# Patient Record
Sex: Female | Born: 1955 | Race: White | Hispanic: No | Smoking: Current every day smoker
Health system: Southern US, Community
[De-identification: ages and names within clinical notes are randomized; demographics above are authoritative.]

## PROBLEM LIST (undated history)

## (undated) DIAGNOSIS — C9200 Acute myeloblastic leukemia, not having achieved remission: Secondary | ICD-10-CM

## (undated) DIAGNOSIS — E871 Hypo-osmolality and hyponatremia: Secondary | ICD-10-CM

## (undated) DIAGNOSIS — C9590 Leukemia, unspecified not having achieved remission: Secondary | ICD-10-CM

## (undated) DIAGNOSIS — I700 Atherosclerosis of aorta: Secondary | ICD-10-CM

## (undated) DIAGNOSIS — D649 Anemia, unspecified: Secondary | ICD-10-CM

## (undated) DIAGNOSIS — K219 Gastro-esophageal reflux disease without esophagitis: Secondary | ICD-10-CM

## (undated) DIAGNOSIS — D696 Thrombocytopenia, unspecified: Principal | ICD-10-CM

## (undated) HISTORY — PX: HIP ARTHROSCOPY: SUR88

## (undated) HISTORY — PX: OTHER SURGICAL HISTORY: SHX169

## (undated) HISTORY — PX: CARDIAC SURGERY: SHX584

## (undated) HISTORY — PX: CHOLECYSTECTOMY: SHX55

---

## 1998-12-31 ENCOUNTER — Encounter: Payer: Self-pay | Admitting: *Deleted

## 1999-01-01 ENCOUNTER — Inpatient Hospital Stay (HOSPITAL_COMMUNITY): Admission: EM | Admit: 1999-01-01 | Discharge: 1999-01-01 | Payer: Self-pay | Admitting: *Deleted

## 1999-01-01 ENCOUNTER — Encounter: Payer: Self-pay | Admitting: Cardiology

## 1999-02-22 ENCOUNTER — Ambulatory Visit (HOSPITAL_COMMUNITY): Admission: RE | Admit: 1999-02-22 | Discharge: 1999-02-22 | Payer: Self-pay | Admitting: Cardiology

## 2000-05-10 ENCOUNTER — Emergency Department (HOSPITAL_COMMUNITY): Admission: EM | Admit: 2000-05-10 | Discharge: 2000-05-11 | Payer: Self-pay | Admitting: Emergency Medicine

## 2000-05-11 ENCOUNTER — Encounter: Payer: Self-pay | Admitting: Emergency Medicine

## 2001-10-09 ENCOUNTER — Observation Stay (HOSPITAL_COMMUNITY): Admission: EM | Admit: 2001-10-09 | Discharge: 2001-10-10 | Payer: Self-pay | Admitting: *Deleted

## 2001-10-09 ENCOUNTER — Encounter: Payer: Self-pay | Admitting: *Deleted

## 2003-10-18 ENCOUNTER — Emergency Department (HOSPITAL_COMMUNITY): Admission: EM | Admit: 2003-10-18 | Discharge: 2003-10-19 | Payer: Self-pay | Admitting: Emergency Medicine

## 2004-03-06 ENCOUNTER — Emergency Department (HOSPITAL_COMMUNITY): Admission: EM | Admit: 2004-03-06 | Discharge: 2004-03-07 | Payer: Self-pay | Admitting: Emergency Medicine

## 2004-10-30 ENCOUNTER — Inpatient Hospital Stay (HOSPITAL_COMMUNITY): Admission: EM | Admit: 2004-10-30 | Discharge: 2004-11-03 | Payer: Self-pay | Admitting: Emergency Medicine

## 2004-11-12 ENCOUNTER — Ambulatory Visit (HOSPITAL_COMMUNITY): Admission: RE | Admit: 2004-11-12 | Discharge: 2004-11-12 | Payer: Self-pay

## 2004-11-27 ENCOUNTER — Ambulatory Visit: Payer: Self-pay | Admitting: Internal Medicine

## 2004-12-07 ENCOUNTER — Encounter (INDEPENDENT_AMBULATORY_CARE_PROVIDER_SITE_OTHER): Payer: Self-pay | Admitting: *Deleted

## 2004-12-07 ENCOUNTER — Ambulatory Visit: Payer: Self-pay | Admitting: Gastroenterology

## 2005-01-14 ENCOUNTER — Ambulatory Visit: Payer: Self-pay | Admitting: Internal Medicine

## 2006-05-21 ENCOUNTER — Encounter (INDEPENDENT_AMBULATORY_CARE_PROVIDER_SITE_OTHER): Payer: Self-pay | Admitting: Specialist

## 2006-05-21 ENCOUNTER — Ambulatory Visit (HOSPITAL_COMMUNITY): Admission: RE | Admit: 2006-05-21 | Discharge: 2006-05-21 | Payer: Self-pay | Admitting: Specialist

## 2007-03-08 IMAGING — US US ABDOMEN COMPLETE
1 series · 13 of 25 positions shown · non-contrast
Comparison: CT of the chest and abdomen dated 10/30/04.

CLINICAL DATA: Nausea and diarrhea.
 ABDOMEN ULTRASOUND:
TECHNIQUE: Complete abdominal ultrasound examination was performed including evaluation of the liver, gallbladder, bile ducts, pancreas, kidneys, spleen, IVC, and abdominal aorta.

[Series 1: unknown · 0.33mm/px · 13 of 49 slices shown]
[im 1/49]
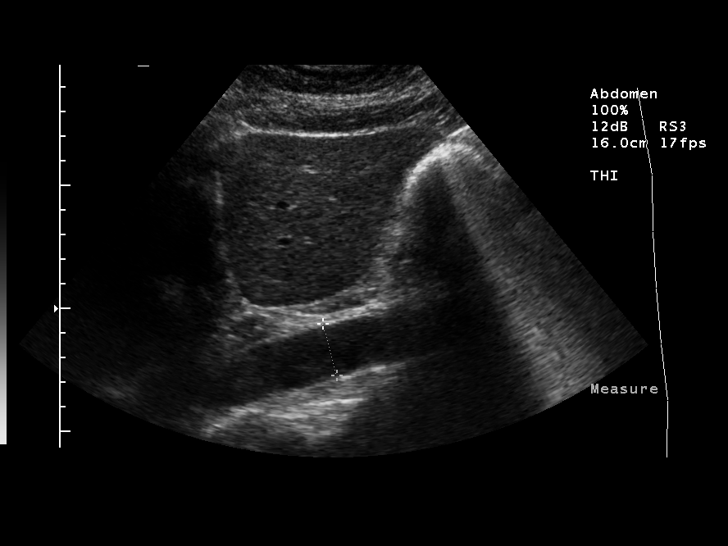
[im 5/49]
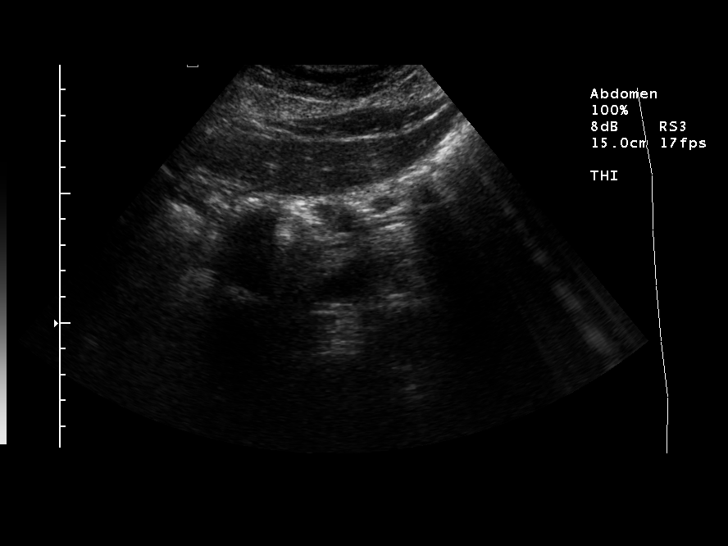
[im 9/49]
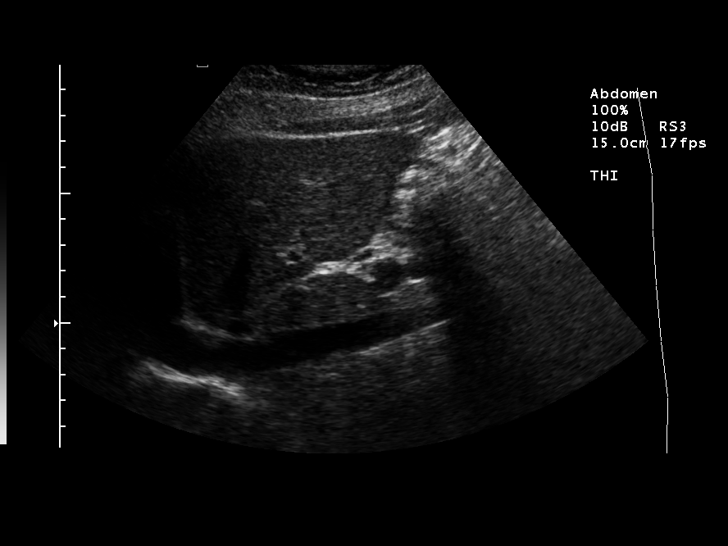
[im 13/49]
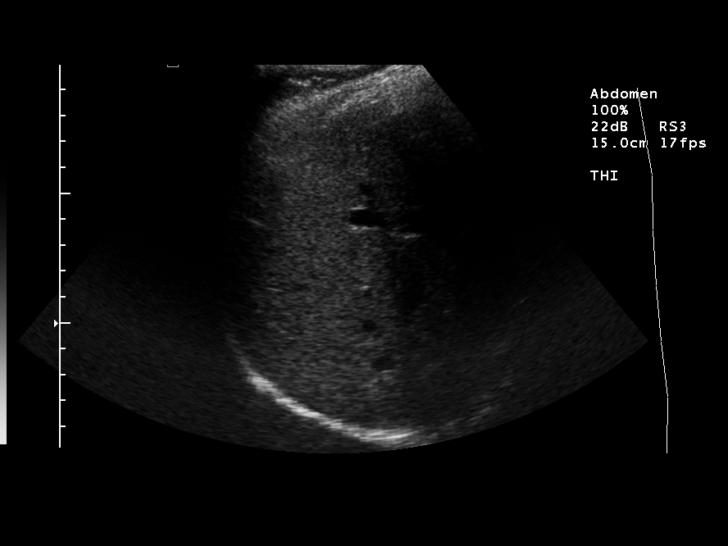
[im 17/49]
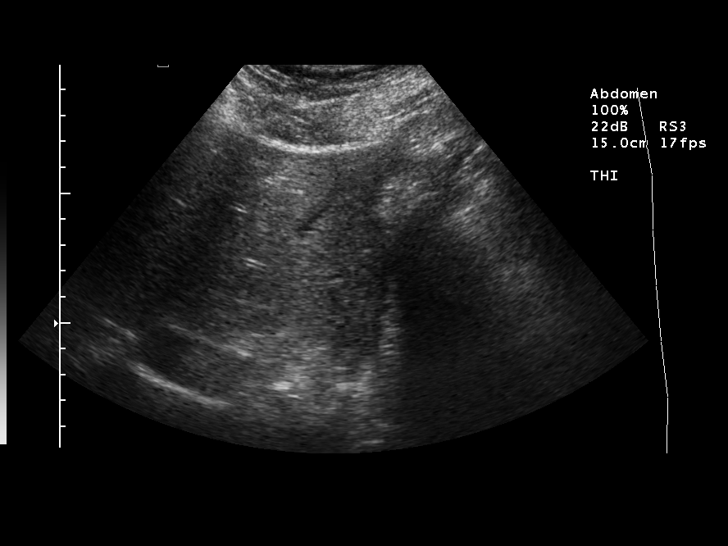
[im 21/49]
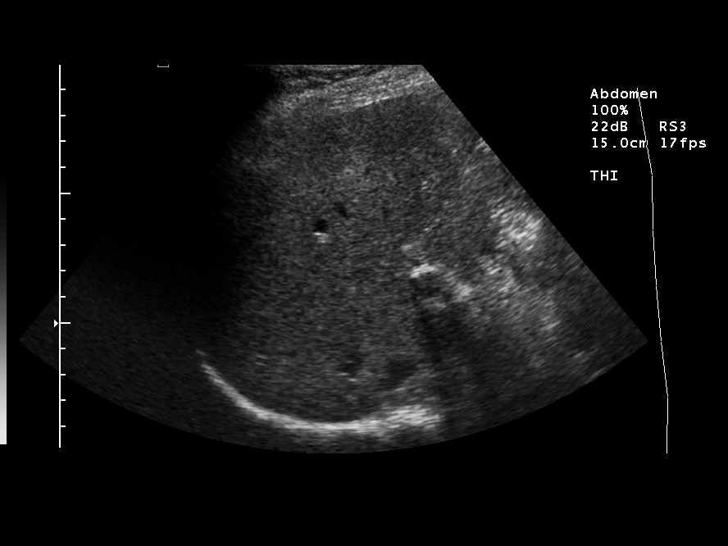
[im 25/49]
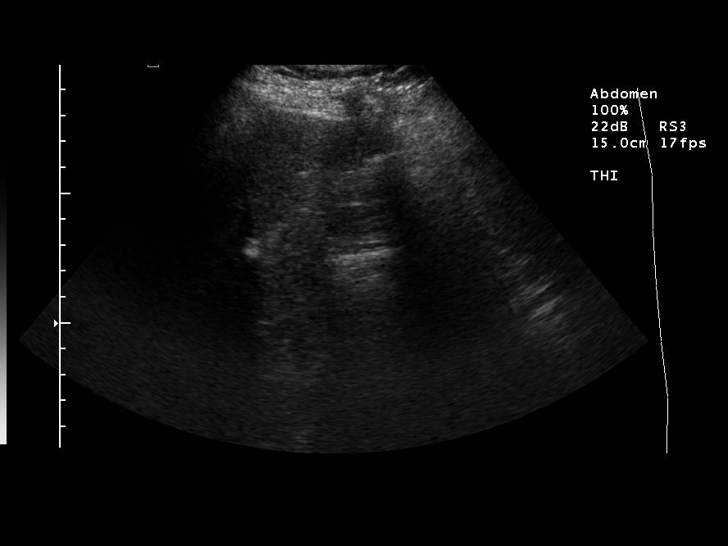
[im 29/49]
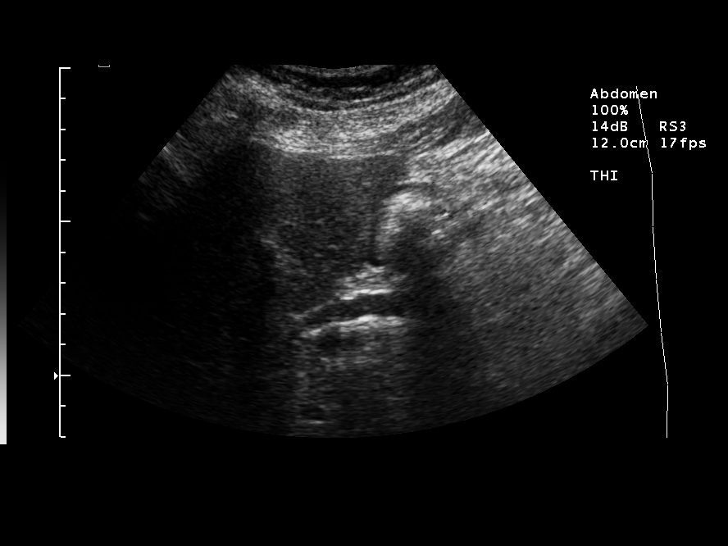
[im 33/49]
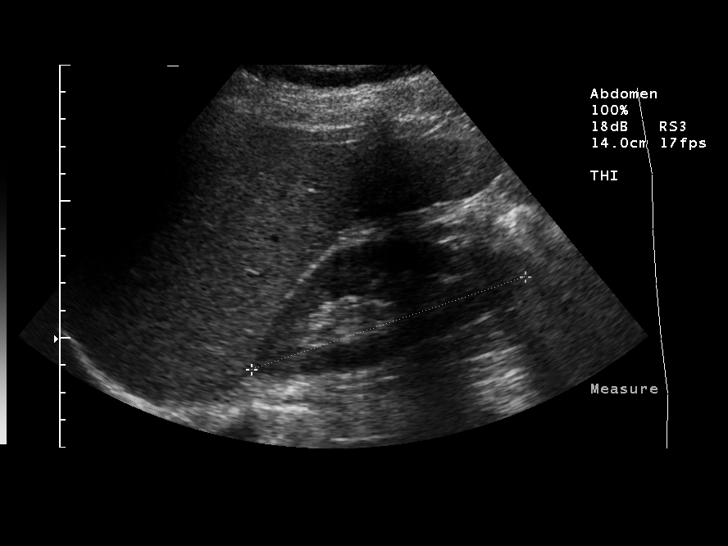
[im 37/49]
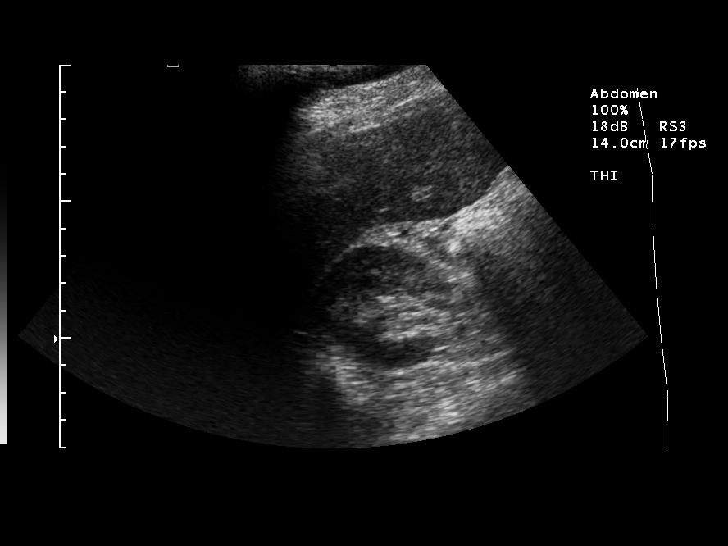
[im 41/49]
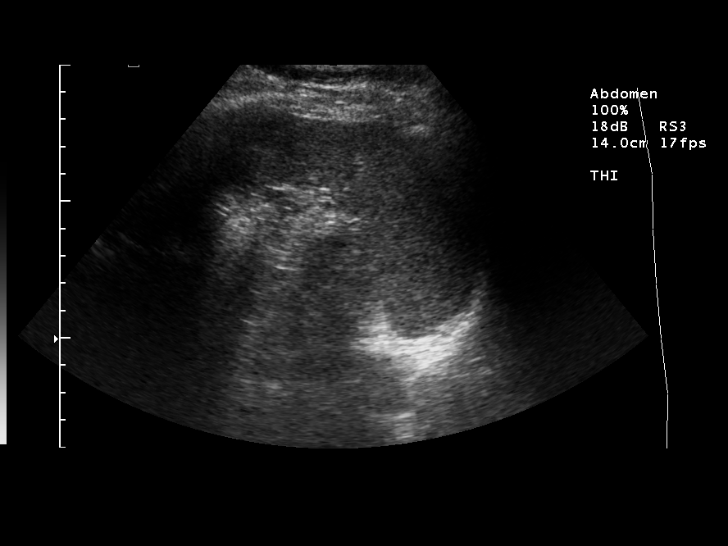
[im 45/49]
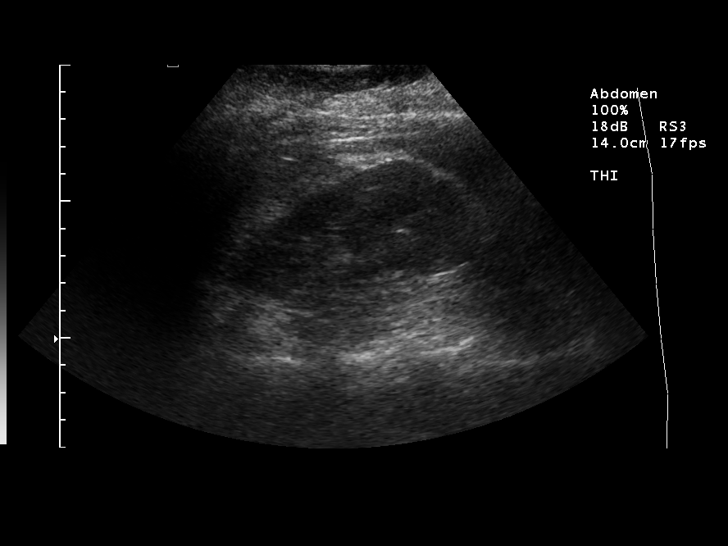
[im 49/49]
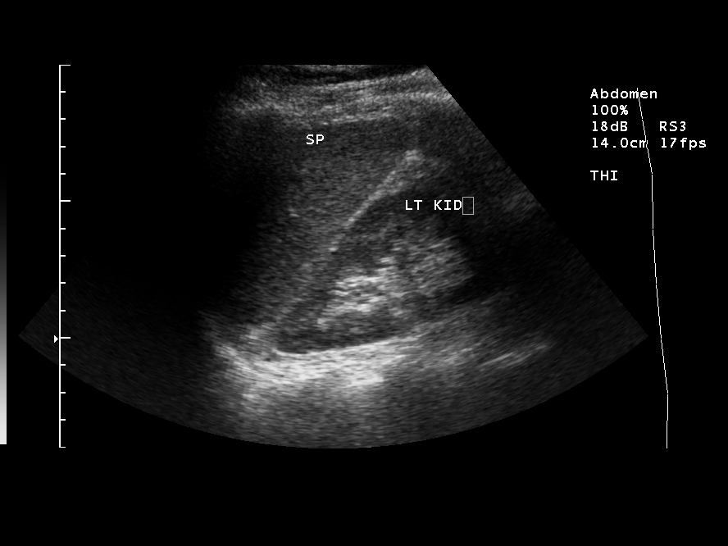

[13 of 25 positions shown; findings below may reference images not displayed]

FINDINGS: The patient is status post cholecystectomy.  The liver is of normal size and echotexture measuring approximately 15 cm. There is no intrahepatic biliary dilatation.  The common bile duct measures 7.5 mm, likely within normal limits following a cholecystectomy.  The duct can be followed to the pancreatic head with no obstructing mass identified.  The remainder of the pancreas is not well visualized due to overlying bowel gas. No focal mass lesions are identified within the pancreatic head. The aorta is of normal size and echotexture, tapering from 2.2 cm proximally to 1.2 cm distally.  IVC is unremarkable.
 The right kidney is a normal size and echotexture measuring 10.6 cm.  No focal mass lesions, stones or hydronephrosis.
 The spleen is of normal size and echotexture measuring 11.2 cm maximally.  The left kidney is also unremarkable.  It measures 10.7 cm maximally with no focal stone mass or evidence for hydronephrosis.
IMPRESSION: 1.  7.5 mm common bile duct is likely within normal limits following cholecystectomy. No focal mass lesion is identified.  
 2.  Otherwise negative abdominal ultrasound.

## 2008-04-11 ENCOUNTER — Ambulatory Visit: Payer: Self-pay | Admitting: Internal Medicine

## 2008-04-11 ENCOUNTER — Observation Stay (HOSPITAL_COMMUNITY): Admission: EM | Admit: 2008-04-11 | Discharge: 2008-04-12 | Payer: Self-pay | Admitting: Emergency Medicine

## 2008-09-04 ENCOUNTER — Emergency Department (HOSPITAL_COMMUNITY): Admission: EM | Admit: 2008-09-04 | Discharge: 2008-09-05 | Payer: Self-pay | Admitting: Emergency Medicine

## 2008-10-11 ENCOUNTER — Telehealth: Payer: Self-pay | Admitting: Gastroenterology

## 2009-08-16 ENCOUNTER — Ambulatory Visit: Payer: Self-pay | Admitting: Pain Medicine

## 2009-11-03 ENCOUNTER — Emergency Department: Payer: Self-pay | Admitting: Emergency Medicine

## 2009-11-06 ENCOUNTER — Emergency Department: Payer: Self-pay | Admitting: Emergency Medicine

## 2009-11-07 ENCOUNTER — Emergency Department: Payer: Self-pay | Admitting: Emergency Medicine

## 2009-11-08 ENCOUNTER — Observation Stay (HOSPITAL_COMMUNITY): Admission: EM | Admit: 2009-11-08 | Discharge: 2009-11-08 | Payer: Self-pay | Admitting: Emergency Medicine

## 2010-06-22 LAB — DIFFERENTIAL
Basophils Relative: 0 % (ref 0–1)
Basophils Relative: 0 % (ref 0–1)
Eosinophils Absolute: 0.3 10*3/uL (ref 0.0–0.7)
Eosinophils Relative: 1 % (ref 0–5)
Eosinophils Relative: 1 % (ref 0–5)
Monocytes Absolute: 1.2 10*3/uL — ABNORMAL HIGH (ref 0.1–1.0)
Monocytes Absolute: 3.8 10*3/uL — ABNORMAL HIGH (ref 0.1–1.0)
Monocytes Relative: 7 % (ref 3–12)
Neutro Abs: 26.3 10*3/uL — ABNORMAL HIGH (ref 1.7–7.7)
Neutrophils Relative %: 74 % (ref 43–77)

## 2010-06-22 LAB — CBC
MCH: 29.4 pg (ref 26.0–34.0)
MCHC: 34.3 g/dL (ref 30.0–36.0)
MCV: 85.5 fL (ref 78.0–100.0)
MCV: 85.5 fL (ref 78.0–100.0)
Platelets: 332 10*3/uL (ref 150–400)
Platelets: 488 10*3/uL — ABNORMAL HIGH (ref 150–400)
WBC: 54.7 10*3/uL (ref 4.0–10.5)

## 2010-06-22 LAB — BASIC METABOLIC PANEL
Calcium: 9.7 mg/dL (ref 8.4–10.5)
Chloride: 99 mEq/L (ref 96–112)
Creatinine, Ser: 0.95 mg/dL (ref 0.4–1.2)
GFR calc Af Amer: >60 mL/min (ref >60)
Potassium: 3.3 mEq/L — ABNORMAL LOW (ref 3.5–5.1)

## 2010-07-17 LAB — URINALYSIS, ROUTINE W REFLEX MICROSCOPIC
Bilirubin Urine: NEGATIVE
Glucose, UA: NEGATIVE mg/dL
Hgb urine dipstick: NEGATIVE
Ketones, ur: NEGATIVE mg/dL
Nitrite: NEGATIVE
Protein, ur: NEGATIVE mg/dL
Urobilinogen, UA: 0.2 mg/dL (ref 0.0–1.0)
pH: 5 (ref 5.0–8.0)

## 2010-07-17 LAB — BASIC METABOLIC PANEL
Chloride: 110 mEq/L (ref 96–112)
GFR calc non Af Amer: >60 mL/min (ref >60)
Glucose, Bld: 120 mg/dL — ABNORMAL HIGH (ref 70–99)
Potassium: 3.5 mEq/L (ref 3.5–5.1)

## 2010-07-17 LAB — CBC
Hemoglobin: 14.4 g/dL (ref 12.0–15.0)
MCHC: 33.3 g/dL (ref 30.0–36.0)
MCV: 84.7 fL (ref 78.0–100.0)
RDW: 14.1 % (ref 11.5–15.5)
WBC: 24.8 10*3/uL — ABNORMAL HIGH (ref 4.0–10.5)

## 2010-07-17 LAB — HEPATIC FUNCTION PANEL
Alkaline Phosphatase: 68 U/L (ref 39–117)
Bilirubin, Direct: <0.1 mg/dL (ref 0.0–0.3)

## 2010-07-17 LAB — DIFFERENTIAL
Basophils Relative: 1 % (ref 0–1)
Eosinophils Absolute: 0.1 10*3/uL (ref 0.0–0.7)
Eosinophils Relative: 1 % (ref 0–5)
Lymphocytes Relative: 13 % (ref 12–46)
Neutro Abs: 20.3 10*3/uL — ABNORMAL HIGH (ref 1.7–7.7)
Neutrophils Relative %: 82 % — ABNORMAL HIGH (ref 43–77)

## 2010-07-23 LAB — CARDIAC PANEL(CRET KIN+CKTOT+MB+TROPI)
CK, MB: 1 ng/mL (ref 0.3–4.0)
Relative Index: INVALID (ref 0.0–2.5)
Total CK: 73 U/L (ref 7–177)
Troponin I: 0.02 ng/mL (ref 0.00–0.06)

## 2010-07-23 LAB — COMPREHENSIVE METABOLIC PANEL
ALT: 34 U/L (ref 0–35)
AST: 56 U/L — ABNORMAL HIGH (ref 0–37)
Albumin: 3.7 g/dL (ref 3.5–5.2)
Alkaline Phosphatase: 74 U/L (ref 39–117)
Chloride: 102 mEq/L (ref 96–112)
GFR calc Af Amer: >60 mL/min (ref >60)
Potassium: 4.1 mEq/L (ref 3.5–5.1)
Sodium: 136 mEq/L (ref 135–145)
Total Bilirubin: 0.9 mg/dL (ref 0.3–1.2)

## 2010-07-23 LAB — CBC
HCT: 44.1 % (ref 36.0–46.0)
Hemoglobin: 14.6 g/dL (ref 12.0–15.0)
MCV: 87.4 fL (ref 78.0–100.0)
Platelets: 299 10*3/uL (ref 150–400)
Platelets: 323 10*3/uL (ref 150–400)
RBC: 4.84 MIL/uL (ref 3.87–5.11)
RDW: 14.3 % (ref 11.5–15.5)
WBC: 13.5 10*3/uL — ABNORMAL HIGH (ref 4.0–10.5)

## 2010-07-23 LAB — PATHOLOGIST SMEAR REVIEW

## 2010-07-23 LAB — DIFFERENTIAL
Basophils Relative: 1 % (ref 0–1)
Eosinophils Relative: 1 % (ref 0–5)
Lymphs Abs: 6.5 10*3/uL — ABNORMAL HIGH (ref 0.7–4.0)
Monocytes Absolute: 0.9 10*3/uL (ref 0.1–1.0)
Monocytes Relative: 6 % (ref 3–12)

## 2010-07-23 LAB — POCT I-STAT, CHEM 8
BUN: 14 mg/dL (ref 6–23)
Chloride: 101 mEq/L (ref 96–112)
Creatinine, Ser: 1 mg/dL (ref 0.4–1.2)
Glucose, Bld: 106 mg/dL — ABNORMAL HIGH (ref 70–99)
HCT: 44 % (ref 36.0–46.0)
Potassium: 4 mEq/L (ref 3.5–5.1)

## 2010-07-23 LAB — URINALYSIS, ROUTINE W REFLEX MICROSCOPIC
Ketones, ur: NEGATIVE mg/dL
Nitrite: NEGATIVE
Protein, ur: NEGATIVE mg/dL
pH: 7 (ref 5.0–8.0)

## 2010-07-23 LAB — LIPID PANEL
Triglycerides: 286 mg/dL — ABNORMAL HIGH (ref <150)
VLDL: 57 mg/dL — ABNORMAL HIGH (ref 0–40)

## 2010-07-23 LAB — RAPID URINE DRUG SCREEN, HOSP PERFORMED
Benzodiazepines: NOT DETECTED
Cocaine: NOT DETECTED
Tetrahydrocannabinol: NOT DETECTED

## 2010-07-23 LAB — URINE MICROSCOPIC-ADD ON

## 2010-07-23 LAB — TSH: TSH: 1.088 u[IU]/mL (ref 0.350–4.500)

## 2010-07-23 LAB — POCT CARDIAC MARKERS: Troponin i, poc: <0.05 ng/mL (ref 0.00–0.09)

## 2010-07-23 LAB — D-DIMER, QUANTITATIVE: D-Dimer, Quant: <0.22 ug/mL-FEU (ref 0.00–0.48)

## 2010-07-23 LAB — CK TOTAL AND CKMB (NOT AT ARMC): CK, MB: 1.1 ng/mL (ref 0.3–4.0)

## 2010-08-21 NOTE — Consult Note (Signed)
[name], [name]                ACCOUNT NO.:  [id number]   MEDICAL RECORD NO.:  [id number]          PATIENT TYPE:  OBV   LOCATION:  4711                         FACILITY:  [facility]   PHYSICIAN:  Georga Hacking, M.D.DATE OF BIRTH:  31-Oct-1955   DATE OF CONSULTATION:  04/11/2008  DATE OF DISCHARGE:                                 CONSULTATION   REASON FOR CONSULTATION:  Shoulder pain.   HISTORY:  This is a 55 year old female who presented to the emergency  room with the acute onset of left posterior upper shoulder pain with  some radiation to the onset of her left arm and the back.  The  discomfort began last night about 10 o'clock and describes it as a  burning severe sharp pain that persisted all evening and she eventually  came to the emergency room today.  An EKG is normal and initial cardiac  enzymes are normal.  It is pertinent that she had a normal cardiac  catheterization because of some chest pain and shortness of breath in  2000 by me and again reports a catheterization in 2003 by Dr. Chanda Busing possibly in an outpatient facility.  She has not had much in the  way of cardiac symptoms since then but has continued to smoke and has a  family history of cardiac disease.  She did have an admission in 2006  with bilateral shoulder pain and was diagnosed with cervical spondylosis  and saw a neurosurgeon at that time.  She denies shortness of breath,  PND, orthopnea, edema or palpitations.   PAST HISTORY:  1. No hypertension or diabetes.  2. Does not know her cholesterol numbers.  3. Has a history of anxiety and reflux.   PREVIOUS SURGERY:  She has had hip surgery, tubal ligation and  gallbladder surgery.   ALLERGIES:  PENICILLIN and CODEINE.   CURRENT MEDICATIONS:  Omeprazole and alprazolam.   FAMILY HISTORY:  Mother is living at age 70, father died of heart attack  and diabetes at age 19, one sister had MI at age 100.   SOCIAL HISTORY:  She works at the [employer].  No  alcohol use.  Smokes about a pack of cigarettes per week.  Has been  married 14 years.  This is her second marriage.   REVIEW OF SYSTEMS:  No eye, ear, nose or throat problems.  No difficulty  swallowing or symptoms of reflux.  Does have some very mild neck pain at  times.  Does have anxiety.  Other than what is reported above, the  remainder of the review systems is unremarkable.   PHYSICAL EXAMINATION:  GENERAL:  She is a middle-aged female who is  complaining of upper shoulder pain.  VITAL SIGNS:  Blood pressure is 132/78, pulse 60.  SKIN:  Warm and dry.  ENT:  EOMI.  PERRLA.  Pharynx negative.  Fundi not remarkable.  CNS:  Clear.  NECK:  Supple without masses, JVD, thyromegaly or bruits.  LUNGS:  Clear to A&P.  CARDIAC:  Normal S1-S2, no S3 or murmur.  ABDOMEN:  Soft, nontender.  EXTREMITIES:  Distal pulses 2+.  NEUROLOGIC:  Some slight weakness of the left arm to grip and motion.   EKG is normal.   IMPRESSION:  1. Acute onset of upper shoulder pain that is atypical for cardiac      pain.  The ongoing nature and characteristics of pain would suggest      more neuropathic pain, possibly an acute cervical disk.  2. Cigarette abuse.  3. History of normal coronary arteries in 2000 and reportedly in 2003.  4. Anxiety.   RECOMMENDATIONS:  I would rule out an MI but doubt this is significant  cardiac pain.  I would get an acute MRI of her neck and consider  neurosurgical consultation.      Georga Hacking, M.D.  Electronically Signed     WST/MEDQ  D:  04/11/2008  T:  04/12/2008  Job:  [id number]   cc:   Dr. Vear Clock

## 2010-08-24 NOTE — Discharge Summary (Signed)
NAME:  [name], [name]                ACCOUNT NO.:  [id number]   MEDICAL RECORD NO.:  [id number]          PATIENT TYPE:  INP   LOCATION:  5712                         FACILITY:  [facility]   PHYSICIAN:  Melissa L. Ladona Ridgel, MD  DATE OF BIRTH:  October 27, 1955   DATE OF ADMISSION:  DATE OF DISCHARGE:  11/03/2004                                 DISCHARGE SUMMARY   DISCHARGE DIAGNOSIS:  Bilateral shoulder pain.   1.  The patient was evaluated for cardiac causes for this pain. She ruled      out for myocardial infarction and has a history which is negative for      cardiovascular disease after having cardiac catheterization November,      2000. She had normal coronaries with a normal EF. Our suspicion      therefore for cardiac origin for this was fairly low. The patient      therefore underwent MRI of her C-spine showing spondylosis of C5-C7 was      evaluated by neurosurgery who felt that her disease was moderate and      that deserved a follow up in about 6 weeks. They felt her symptoms was      consistent with possible carpal tunnel as well as some spondylarthritis      arthropathy.   1.  Diarrhea. The patient has expressed in the past on and off diarrhea      since her gallbladder removal some years ago. She has been taking      Aciphex which did not seem to give her any relief therefore she was      converted over to Protonix. On Protonix the patient describes decreased      chest burning; her diarrhea decreased. All stool cultures at this time      have remained within normal limits. Her H. Pylori as negative at less      than 0.14. At this time we feel that no further inpatient studies are      necessary. I have recommended that the patient see a GI specialist as an      outpatient for full endoscopic and colonoscopy workup. Of note, the      patient was found to have a low TSH on admission. This was repeated      which showed that she has thyroid function within normal limits. I  therefore do not believe that her diarrhea is related to      hyperthyroidism. On the day prior to admission I discontinued the      standing dose of Imodium. She did have one small episode of diarrhea      during the course of the night. This significantly decreased compared to      the course of the hospital stay.  I therefore am recommending that she      be discharged to home on Protonix 40 mg daily, Imodium as needed, and      follow up with GI consultants as possible.   1.  Tobacco abuse. The patient has not smoked during the course of hospital  stay and wishes to discontinue smoking. I have recommended she contact      her insurance company and discuss with them their tobacco cessation      planning.   1.  Mild leukocytosis. On admission the patient's white count was 16      thousand, a source however has not been identified. Her urinalysis was      within normal limits. She had no evidence for pneumonia and at the time      of discharge her white count has returned to normal at 9.1 thousand.      There is suspicion that she may have had a gastroenteritis which would      explain all the above symptoms.   1.  Gastroesophageal reflux disease. As stated I  have recommended the use      of Protonix.   1.  Medications at the time of discharge will be Protonix 40 mg daily, and      Imodium as needed.   HISTORY OF PRESENT ILLNESS:  The patient is at 55 year old white female with  known history of GERD and tobacco abuse who presented to the emergency room  for bilateral shoulder pain. The patient states that she had a full cardiac  workup in  November, 2000.  However a day of admission  she stated that she  had bilateral arm numbness with diaphoresis and what she felt was felt was  palpitations. The patient states that symptoms lasted about 15 minutes but  then dissipated. The patient presented to the emergency room for further  evaluation was found to be hemodynamically stable,  but with a slight  elevation in her myoglobin but no evidence for myocardial damage in that her  MB and troponin were within normal limits. The patient was admitted to  telemetry for further evaluation. After ruling out for myocardial infarction  the patient underwent neurosurgical consult to evaluate findings of  C5, C6 spondyloarthropathy which makes signs of her shoulder numbness and  pressure. During the course of stay the patient developed diffuse diarrhea  for which she was started on Imodium and stool cultures were sent which  showed no obvious source for infection. therefore felt that this may be  gastroenteritis related to a viral source. At the time of discharge the  patient's diarrhea decreased significantly and we are recommending that she  continue Protonix and follow up as an outpatient for GI evaluation.   On the day of discharge the patient remains hemodynamically stable. Her  blood pressure is 123/63, heart rate of 62, respirations 18, temperature  90.1 with O2 sats of 95%.   PHYSICAL EXAMINATION:  GENERAL:  She is well-developed, well-nourished, no  acute distress. Pupils equal, reactive to light. Extraocular muscles are  intact at the 26 of his JVD, no lymph nodes, no carotid bruits. CHEST: Clear  to auscultation. There is no rhonchi, rales or wheezes. CARDIOVASCULAR:  Regular rate and rhythm. Positive S2. There is, no S3 or S4.  There is a 1 to 2 out of 6 systolic ejection murmur at the left sternal  border. ABDOMEN:  Soft, nontender, nondistended. EXTREMITIES:  Show no  clubbing, cyanosis or edema NEUROLOGIC:  The patient is nonfocal.  *  Pertinent laboratory values during the course of hospital stay revealed a  white count of 9.1 on discharge, hemoglobin of 12.4, hematocrit 35.8,  platelets of 264,000. Sodium is 121, potassium 3.5, chloride is 108, CO2 is 24, BUN 7, creatinine 0.9, glucose 100, calcium was  8.4 Stool for white  blood cell count's was negative. Stool  culture shows no obvious growth.  However, it was returned for re incubation, no ova and parasite were  identified C. difficile was negative. Urine grew multiple species only at  15,000. Repeat TSH was 1.125 with total T3 of 87 and a free T-4 of 1.02.  Heme checks on her stools were negative. Helicobacter pylori antibody IgG  was less than +0.4 her D-dimer was less 022.   Pertinent radiological studies showed some linear atelectasis at the bases,  otherwise clear. CT angiography was completed. The chest and abdomen  revealed no pulmonary emboli. Abdominal aorta was within normal limits.  There is no evidence for dissection or aneurysmal dilatation. Pancreas was  normal, kidneys were symmetric and no inflammatory changes were noted. MRI  of the C-spine showed  spondylosis of C5-C6 and be C6, C7 with bilateral  neural foraminal narrowing and near effacement of the ventral thecal sac,  narrowing of  the left neural foramen in C6-C7 as well.   At this time the patient is deemed stable for discharge to follow up with  Dr. Vear Clock and obtain an outpatient GI workup and will continue her  Protonix as stated.   The condition at discharge is stable.       MLT/MEDQ  D:  11/03/2004  T:  11/03/2004  Job:  [id number]

## 2010-08-24 NOTE — Op Note (Signed)
NAME:  [name], [name]                ACCOUNT NO.:  [id number]   MEDICAL RECORD NO.:  [id number]          PATIENT TYPE:  AMB   LOCATION:  DAY                          FACILITY:  [facility]   PHYSICIAN:  Jene Every, M.D.    DATE OF BIRTH:  10-Sep-1955   DATE OF PROCEDURE:  05/21/2006  DATE OF DISCHARGE:                               OPERATIVE REPORT   PREOPERATIVE DIAGNOSIS:  Trochanteric bursitis refractory of left hip.   POSTOPERATIVE DIAGNOSIS:  Trochanteric bursitis refractory of left hip.   PROCEDURES PERFORMED:  1. Open trochanteric bursal excision.  2. Z-plasty of the fascia lata with associated fenestration.   ANESTHESIA:  General anesthesia.   ASSISTANT:  None.   BRIEF HISTORY:  This is a 55 year old female with refractory  trochanteric bursitis of the left hip.  She has had multiple  corticosteroid injections with temporary benefit.  She has undergone  physical therapy, stretching, activity modification without avail.  Had  localized pain relieved with the injection temporarily.  Had no evidence  of a lumbar radiculopathy.  She was disabled by her symptomatology and  we discussed trochanteric bursectomy.  Risks and benefits discussed  including bleeding, infection, no change in symptoms, worsening in  symptoms, need for repeat bursectomy in the future, anesthetic  complications, etc.   DESCRIPTION OF PROCEDURE:  Patient in supine position. After the  induction of general anesthesia, 1 g Kefzol, she was placed in the right  lateral decubitus position.  All bony prominences well-padded.  The left  trochanteric region and left lower extremity is prepped and draped in  the usual sterile fashion.  I palpated the proximal greater  trochanteric.  Made approximately a 10 cm incision over the trochanter.  Subcutaneous tissue was dissected.  Electrocautery was used to achieve  hemostasis.  She had ample subcutaneous adipose tissue.  We identified  the point of the greater  trochanter.  Incised the fascia lata  longitudinally for approximately 5 cm.  This seemed to be fairly tight.  There was exuberant hypertrophic bursal tissue noted.  This was excised  and debrided and sent to pathology for evaluation.  Electrocautery was  utilized to achieve hemostasis.  We used Marcaine with epinephrine on  the subcutaneous tissues.  Then on either end of the incision, reflected  the fascia for the Z-plasty, performing the appropriate advancement and  closing with #1 Vicryl in figure-of-eight sutures.  I then fenestrated  the anterior and posterior aspects of the fascia lata to decrease the  tension over the trochanter.  This seemed to help in terms of the  tension.  Copiously irrigated the wound.  Electrocautery was utilized to  achieve hemostasis.  I closed the subcutaneous tissue in layers with 2-0  Vicryl simple sutures in the subcutaneous tissue as well and closed the  skin with 4-0 subcuticular Prolene.  Wound reinforced with Steri-Strips.  Sterile dressing applied.  She was then awoken without difficulty and  transported to the recovery room in satisfactory condition.   Patient tolerated the procedure well with no complications.   BLOOD LOSS:  Minimal.  Jene Every, M.D.  Electronically Signed     JB/MEDQ  D:  05/21/2006  T:  05/21/2006  Job:  [id number]

## 2010-08-24 NOTE — Discharge Summary (Signed)
[name], [name]                ACCOUNT NO.:  [id number]   MEDICAL RECORD NO.:  [id number]          PATIENT TYPE:  OBV   LOCATION:  4711                         FACILITY:  [facility]   PHYSICIAN:  [name], M.D.    DATE OF BIRTH:  03/11/1956   DATE OF ADMISSION:  04/11/2008  DATE OF DISCHARGE:  04/12/2008                               DISCHARGE SUMMARY   DISCHARGE DIAGNOSES:  1. Left shoulder and chest pain, most likely secondary to cervical      radiculopathy.  2. Gastroesophageal reflux disease.  3. Hyperlipidemia.  4. Tobacco abuse.  5. Leukocytosis.  6. Anxiety   DISCHARGE MEDICATIONS:  1. Aspirin 81 mg p.o. daily.  2. Omeprazole 20 mg p.o. daily.  3. Xanax 1 mg p.o. b.i.d. p.r.n. for anxiety.  4. Percocet 5/325 take 1 tablet q.6 h. p.r.n. for pain.  5. Simvastatin 20 mg p.o. daily.  6. Phenergan 25 mg p.o. q.6 h. p.r.n. for nausea.   DISPOSITION AND FOLLOWUP:  [name] has been discharged from the  hospital on April 12, 2008, in stable and improved condition.  Chest  pain and left shoulder pain has improved with pain medication.  Because  we think that her left shoulder and chest pain is most likely from her  cervical radiculopathy, not from cardiac cause.  She will have an  appointment with neurosurgeon [name] on April 20, 2008, at 11:15  a.m. to check her cervical spine disease.  She also needs to follow up  with her primary doctor [name] in [place] in 3 months.  At  that time, she needs to check her CMET, and also to check if she has any  muscle pain.   CONSULTATION:  [name], M.D. of Cardiology.   PROCEDURE PERFORMED:  1. Chest x-ray on April 11, 2008, no acute findings.  2. MRI of the left shoulder on April 11, 2008 shows mild      supraspinatus tendonitis and mild superior labral degeneration, but      no acute findings.  3. MRI of the C-spine without contrast on April 11, 2008, shows      chronic spondylosis with posterior  osteophytes covering diffuse      disk bulging at the C5-C6 and C6-C7.  At both levels, there is left      greater than right foraminal stenosis with possible encroachment on      the exiting nerve root.  The left foraminal stenosis appears      slightly progressive at C5-C7 compared to the previous study in      2006.  4. Stable central disk protrusion at C4-C5.  Stable mild left      foraminal stenosis at C3-C4 due to uncinate spurring.   ADMISSION HISTORY:  The patient is a 55 year old female with past  medical history of chest pain and shoulder pain (admitted in 2006, which  were not of cardiac cause), GERD, and cervical spondylosis came in to  the [facility] Emergency Department for left shoulder pain and left  chest pain that started the night before admission when  she was watching  TV.  The left shoulder pain was stabbing and burning, and also travels  to the left chest and left upper arm, she also felt pain in her chest.  The shoulder pain was 10/10 and the chest pain was 8/10.  It was  constant, nothing could make them change.  In the morning of admission  date, she found that the pain persisted, and though she went to see her  primary care doctor, [name], and she was asked to go to [facility]  Emergency Department for further evaluation of her chest and left  shoulder pain.  The patient has no headache, sweating, fever, or chills.  No weakness or numbness.  Denies use of any alcohol or drugs.   ADMISSION PHYSICAL EXAMINATION:  VITAL SIGNS:  Temperature 97, blood  pressure 132/78, pulse 84, respiration rate 16, and oxygen saturation  98% on room air.  GENERAL:  The patient feels distress of shoulder and chest pain.  EYES:  Extraocular movements intact.  NECK:  Supple.  No thyroid enlargement.  LUNGS:  Clear to auscultation bilaterally.  No wheezing or crackles.  HEART:  Regular rate and rhythm.  No murmur.  ABDOMEN:  Soft.  No tenderness and distention.  Bowel sounds  are  positive.  EXTREMITIES:  No edema or movement limitation.  NEURO:  Alert and oriented x3.  Cranial nerves II through XII intact.  Deep tendon reflexes 2+.  Muscle strength 4-5/5.  Sensation was within  normal limits.  PSYCHIATRY:  Appropriate.   ADMISSION LABORATORY DATA:  White blood cells 15.4, hemoglobin 14.6, and  platelet 323.  Sodium 138, potassium 4, chloride 101, bicarbonate 27,  BUN 14, creatinine 1, and glucose 106.  D-dimer less than 0.22.  Cardiac  enzymes negative x3.   HOSPITAL COURSE:  Problem #1:  Left shoulder and chest pain likely  secondary to cervical radiculopath.  The patient has previous chest pain  and shoulder pain, and has been admitted to the hospital, and did the  cardiac workup, which were all negative.  The patient has left shoulder  pain and chest pain that started the night before admission, and it  persisted.  We have asked the Cardiology consult by [name].  He  thought that this is probably not from the cardiac cause and recommended  for the MRI of the C-spine to rule out the cervical spondylosis.  We  admitted the patient to telemetry unit to rule out acute coronary  syndrome, we obtained the EKG, and cyclic cardiac enzymes, which were  all negative, and the MRI of the C-spine shows some progression of the  cervical spondylosis with foraminal stenosis especially on the left side  compared to her MRI in 2006, quite correlated with her symptoms.  So, we  thought that her symptoms were more likely secondary to her cervical  radiculopathy. After discharge, the patient needs to see neurosurgeon  [name] because since last discharge in 2006, the patient has never  seen neurosurgeon [name] although she was suppoesd to see him at that  time.  At this time, we have made the appointment for her with [name]  on January 13 to evaluate her cervical spondylosis for further  management.  Problem #2:  Hyperlipidemia.  During this hospitalization,  we checked  her fasting lipid panel, we found that her LDL was 129, HDL 36,  triglycerides 286, cholesterol 222, and we put her on simvastatin, and  she needs to be follow  up with her primary doctor [name] in about  3 months, and to check if the patient has muscle pain and liver damage  and she needs to do the CMET for liver function.  Problem #3:  GERD.  During the hospitalization, the patient had no acid  reflux symptoms.  After discharge, she will continue to take her home  medication omeprazole for her GERD.  Problem #4:  Tobacco abuse.  We have provided the smoking cessation  counseling for her smoking, and the patient would consider quitting the  smoking in the future, but not at this time.  After discharge, may  continue to provide the smoking counseling and provide help for smoking  cessation at next visit with her primary doctor.  Problem #5:  Leukocytosis.  On admission, her white blood cell was 15.4,  and on discharge her white blood cell has dropped to 13.5, we thought  that leukocytosis is likely from the stress response as seen in her  previous admission in 2006, it is not an infection because the patient  also has no fever or chills, CXR negative.   DISCHARGE VITALS:  Temperature 97.9, blood pressure 128/73, pulse 71,  respiration rate 18, and oxygen saturation 96% on room air.   DISCHARGE LABORATORY DATA:  CBC, white blood cells 13.5, hemoglobin  14.1, and platelet 299.  Sodium 136, potassium 4.1, chloride 102, bicarb  27, BUN 14, creatinine 0.89, and glucose 106.  Cardiac enzymes negative  x3.  TSH 1.088.  UDS positive for opiates.  Cholesterol 222,  triglycerides 286, HDL 36, and LDL 129.      Jackson Latino, MD  Electronically Signed      [name], M.D.  Electronically Signed    ZY/MEDQ  D:  04/29/2008  T:  04/30/2008  Job:  [id number]

## 2010-08-24 NOTE — H&P (Signed)
[name], BARSE                ACCOUNT NO.:  [id number]   MEDICAL RECORD NO.:  [id number]          PATIENT TYPE:  EMS   LOCATION:  MAJO                         FACILITY:  [facility]   PHYSICIAN:  Deirdre Peer. Polite, M.D. DATE OF BIRTH:  22-Jul-1955   DATE OF ADMISSION:  10/30/2004  DATE OF DISCHARGE:                                HISTORY & PHYSICAL   CHIEF COMPLAINT:  Bilateral shoulder pain and numbness.   HISTORY OF PRESENT ILLNESS:  55 year old female with known history of  tobacco abuse, GERD, who presents to the ED for evaluation of bilateral  shoulder pain.  The patient states she was in her usual state of health  until Saturday when, while sitting, she had an episode of heaviness as if  someone were standing on her shoulders bilaterally associated with numbness  in her arm, some diaphoresis, and palpitations.  The patient states the  symptoms lasted approximately 15 minutes then somewhat dissipated.  The  patient had recurrent symptoms similar at rest on Sunday as well as  Saturday, both times lasting about 15 minutes.  Of note, the patient has had  history of evaluation for chest pain which included Cardiolite as well as  cardiac catheterization.  Please note the patient's cardiac catheterization  was performed in November 2000 with normal EF and normal coronary arteries.  As stated because of the patient's symptoms of bilateral shoulder pain, the  patient presented to her primary MD for evaluation who recommended that the  patient present to the ED for evaluation.  In the ED, the patient was  evaluated and found to be hemodynamically stable.  Myoglobin was slightly  elevated at 445, troponin I within normal limits.  CBC showing leukocytosis.  Chest x-ray with no acute infiltrate.  EKG without acute abnormalities.  Because of the patient's symptoms, it was felt that admission was deemed  necessary to rule out anginal equivalent.  At the time of my arrival, the  patient was  without pain in her shoulders, denies any fevers or chills, she  does admit to nausea and vomiting one episode today when she had pressure  sensation in her shoulders.  The patient does admit to continued tobacco  use, approximately one pack per day.  She has been told she had some trouble  with her lipids before in the past with high triglycerides.  However, the  patient denies hypertension, diabetes, MI, or CVA.  The patient does have a  family history of coronary artery disease, father with MI, she is uncertain  of the age that he had the MI, and a sister with an MI at age 65.  Admission  is deemed necessary for further evaluation and treatment.   PAST MEDICAL HISTORY:  Significant for GERD, tobacco abuse.   MEDICATIONS ON ADMISSION:  AcipHex.   SOCIAL HISTORY:  Positive for tobacco, one pack per day.  No alcohol, no  drugs.   PAST SURGICAL HISTORY:  Significant for cholecystectomy approximately 15  years ago.   ALLERGIES:  Reported allergy to codeine.  E-chart shows allergy to  penicillin.   FAMILY HISTORY:  Mother with breast cancer, father with history of melanoma,  diabetes, MI, peripheral vascular disease.  Sister with MI at age 57.   REVIEW OF SYMPTOMS:  As stated in the HPI.  Negative for fever and chills.  Positive for nausea and vomiting x 1.  Positive bilateral shoulder pain  associated with numbness lasting 15 minutes.  Vague abdominal discomfort,  does admit to some urinary frequency, no change in stools, occasional leg  cramps.   PHYSICAL EXAMINATION:  GENERAL:  The patient is alert and oriented x 3.  VITAL SIGNS:  Temperature 97.9, blood pressure 139/65, pulse 80, respiratory  rate 22, saturations 97%.  HEENT:  Pupils equal, round, reactive to light, anicteric sclerae.  No oral  lesions.  NECK:  No JVD, no nodes, no carotid bruits.  LUNGS:  Clear to auscultation bilaterally without rales or rhonchi.  HEART:  Regular S1, S2, no S3 appreciated.  ABDOMEN:  Soft,  nontender, positive bowel sounds.  EXTREMITIES:  No cyanosis, clubbing, and edema, 2+ pulses.  NEUROLOGICAL:  Nonfocal.   LABORATORY DATA:  Chest x-ray linear vascular scarring or atelectasis.  CBC  with white count 16.7, hemoglobin 14.7, hematocrit 42.5, MCV 84, platelets  313, neutrophil count 80.  D-dimer less than 0.22.  Myoglobin 445, CK MB  less than 1, troponin I less than 0.05  BMP with sodium 139, potassium 3.9,  chloride 106, carbon dioxide 24, glucose 102, BUN 18, creatinine 0.8, AST  23, bilirubin 0.9, lipase 23.  CT of the chest report is pending, so far  negative for aortic dissection.   ASSESSMENT:  1.  Atypical chest pain in a patient with a history of normal cath in 2000      but with known risk factors of tobacco abuse and family history of early      coronary artery disease.  2.  Tobacco abuse.  3.  Nausea and vomiting x 1, rule out secondary to gastroesophageal reflux      disease versus infectious etiology.  4.  Gastroesophageal reflux disease.  5.  Leukocytosis.  Please note at this time a definitive etiology is not      known, a chest x-ray without definitive infiltrate, the patient is      without fever or chills.   RECOMMENDATIONS:  The patient will be admitted to a telemetry floor bed.  The patient will have serial cardiac enzymes.  Will check an EKG in the  morning.  Will consider repeat stress test.  Will check lipids and TSH.  As  for the patient's nausea and vomiting, question if this is related to reflux  versus her primary problem of atypical chest pain.  Will check amylase which  is normal, check lipase and bilirubin which are normal.  Gastritis is a  possible cause of this, as well.  However, the patient denies any history of  NSAID use and no history of ulcers.  Also, as for the patient's  leukocytosis, will order further studies, check a UA.  Chest x-ray has been ordered without definitive infiltrate.  The patient's lung exam is within  normal  limits.  Must also consider possible stress modulation.  Will make  further recommendations after review of the above studies.       RDP/MEDQ  D:  10/30/2004  T:  10/30/2004  Job:  [id number]

## 2010-08-24 NOTE — Consult Note (Signed)
NAMEASHLE, [name]                ACCOUNT NO.:  [id number]   MEDICAL RECORD NO.:  [id number]          PATIENT TYPE:  INP   LOCATION:  4707                         FACILITY:  [facility]   PHYSICIAN:  Danae Orleans. Venetia Maxon, M.D.  DATE OF BIRTH:  05/22/1955   DATE OF CONSULTATION:  10/31/2004  DATE OF DISCHARGE:                                   CONSULTATION   REASON FOR CONSULTATION:  Arm numbness and leg weakness.   HISTORY OF ILLNESS:  [name] is a 55 year old woman who was admitted  with vomiting, diarrhea and dehydration with leukocytosis who complains of  bilateral upper extremity numbness and weakness, and shoulder heaviness  after lifting and moving furniture last weekend from one church to another.  She had an MRI of cervical spine for workup of this problem.  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  She is a one-pack  per day smoker.  She notes  hand numbness particularly in the morning.  She  has had a history of chest pain which is an atypical chest pain and has had  negative cardiac catheterization times two.   PAST MEDICAL HISTORY:  The past medical history is as above.   PHYSICAL EXAMINATION:  GENERAL APPEARANCE:  On examination the patient is  awake, alert and fully oriented.  She speaks clear fluent speech  NEUROLOGIC EXAMINATION:  Pupils equal, round and react to light.  Extraocular movements are intact.  Facial sensation and motor are intact and  symmetric.  The patient has minimal neck pain to palpation, left greater  than right, and pericapsular discomfort. She has positive Tinel's sign in  the wrists and mildly positive Phalen's sign bilaterally.  She has 5/5  strength in her upper and lower extremities with the exception of 4+/5 right  biceps strength, 4/5 left biceps strength, 4+/5 left triceps strength and  4+/5 left wrist flexion strength.  Lower extremity strength is  full in all  motor groups bilaterally, symmetric.  The reflexes are 2 in biceps,  triceps, and brachioradialis, 2 at the knees and 2 at the ankles, and  toes  are downgoing to plantar stimulation.  Sensory examination __________  numbness in her fingers to light touch.  Her cerebellar examination is  normal.   LABORATORY DATA:  MRI was reviewed and this does show spondylosis at C5-6  with biforaminal stenosis and left greater than right foraminal stenosis  in  C6-7.   IMPRESSION:  My impression is that [name] is a 55 year old woman with  mild neck pain, numbness in both the upper  extremities and mild weakness in  the both biceps and left triceps.  She has signs and symptoms of carpal  tunnel syndrome as well in addition to mild cervical radiculopathy.   RECOMMENDATIONS:  At the present time her symptoms are mild.  I would not  recommend surgery or other treatment.  If her symptoms persist the patient  should call for an appointment to be reevaluated m my office.  I would like  to  see her back in the office for follow up in six weeks to reassess her  strength.  If her weakness has resolved she should follow up with me after  that on an as needed basis.  I discussed this with the patient and with her  family.  If her weakness persists she may need further treatment such as  traction and nonsurgical treatment,  but ultimately if she develops  significant persistent signs and symptoms or cervical radiculopathy she may  require a surgical intervention.       JDS/MEDQ  D:  10/31/2004  T:  11/01/2004  Job:  [id number]

## 2010-08-24 NOTE — Cardiovascular Report (Signed)
[place]  Patient:    [name]                        MRN: [id number] Proc. Date: 02/22/99 Adm. Date:  [id number] Attending:  Norman Clay CC:         Cardiac Catheterization Laboratory                        Cardiac Catheterization  INDICATIONS:  The patient is a 55 year old female with a positive family history of heart disease and cigarette smoking who had chest discomfort.  She had a negative Cardiolite scan but continues to have chest discomfort and marked cardiac anxiety and catheterization was advised to exclude coronary artery disease.  COMMENTS ABOUT PROCEDURE:  The patient tolerated the procedure well without complications.  Following the procedure, good hemostasis and pedal pulses were present.  HEMODYNAMIC DATA:  Aorta post contrast 127/68, LV post contrast 127/15.  ANGIOGRAPHIC DATA:  LEFT VENTRICULOGRAM:  The left ventriculogram was performed in the 30 degree RAO projection.  The aortic valve was normal.  The mitral valve was normal.  The left ventricle appears normal in size.  The ejection fraction was estimated at 60-65%. Coronary arteries arise and distribute normally.  There is no significant coronary calcification noted.  The system is left dominant.  Left main:  The left main coronary artery appears normal.  Left anterior descending:  The left anterior descending appears normal.   Circumflex:  The circumflex appears normal.  Right coronary artery:  The right coronary artery is a small nondominant vessel but is normal.  IMPRESSION: 1. Normal coronary arteries. 2. Normal left ventricular function. DD:  02/22/99 TD:  02/22/99 Job: [id number]

## 2011-01-15 ENCOUNTER — Ambulatory Visit: Payer: Self-pay

## 2011-07-11 ENCOUNTER — Other Ambulatory Visit (HOSPITAL_COMMUNITY): Payer: Self-pay | Admitting: Family Medicine

## 2011-07-11 DIAGNOSIS — Z139 Encounter for screening, unspecified: Secondary | ICD-10-CM

## 2011-07-15 ENCOUNTER — Ambulatory Visit (HOSPITAL_COMMUNITY): Payer: Self-pay

## 2012-09-10 ENCOUNTER — Telehealth: Payer: Self-pay | Admitting: Internal Medicine

## 2012-09-10 NOTE — Telephone Encounter (Signed)
Called patient to scheduled her as a new pt with Dr.Norins.  Lvmom for pt to call back at her earliest convenience.

## 2013-10-29 ENCOUNTER — Encounter: Payer: Self-pay | Admitting: Internal Medicine

## 2013-10-29 ENCOUNTER — Encounter: Payer: Self-pay | Admitting: Gastroenterology

## 2014-05-24 ENCOUNTER — Other Ambulatory Visit: Payer: Self-pay | Admitting: Dermatology

## 2018-01-15 ENCOUNTER — Encounter: Payer: Self-pay | Admitting: Emergency Medicine

## 2018-01-15 ENCOUNTER — Emergency Department
Admission: EM | Admit: 2018-01-15 | Discharge: 2018-01-15 | Disposition: A | Discharge status: Home / Self Care | Payer: Medicare Other | Attending: Emergency Medicine | Admitting: Emergency Medicine

## 2018-01-15 ENCOUNTER — Other Ambulatory Visit: Payer: Self-pay

## 2018-01-15 ENCOUNTER — Emergency Department: Payer: Medicare Other

## 2018-01-15 DIAGNOSIS — F17200 Nicotine dependence, unspecified, uncomplicated: Secondary | ICD-10-CM | POA: Diagnosis not present

## 2018-01-15 DIAGNOSIS — L03211 Cellulitis of face: Secondary | ICD-10-CM | POA: Diagnosis not present

## 2018-01-15 DIAGNOSIS — R220 Localized swelling, mass and lump, head: Secondary | ICD-10-CM | POA: Diagnosis present

## 2018-01-15 LAB — CBC WITH DIFFERENTIAL/PLATELET
ABS IMMATURE GRANULOCYTES: 0.1 10*3/uL — AB (ref 0.00–0.07)
BASOS PCT: 1 %
Basophils Absolute: 0.1 10*3/uL (ref 0.0–0.1)
Eosinophils Absolute: 0.2 10*3/uL (ref 0.0–0.5)
Eosinophils Relative: 1 %
HCT: 44.6 % (ref 36.0–46.0)
Hemoglobin: 15 g/dL (ref 12.0–15.0)
IMMATURE GRANULOCYTES: 1 %
Lymphocytes Relative: 28 %
Lymphs Abs: 5.3 10*3/uL — ABNORMAL HIGH (ref 0.7–4.0)
MCH: 29.1 pg (ref 26.0–34.0)
MCHC: 33.6 g/dL (ref 30.0–36.0)
MCV: 86.4 fL (ref 80.0–100.0)
MONOS PCT: 8 %
Monocytes Absolute: 1.4 10*3/uL — ABNORMAL HIGH (ref 0.1–1.0)
NEUTROS ABS: 11.8 10*3/uL — AB (ref 1.7–7.7)
NEUTROS PCT: 61 %
NRBC: 0 % (ref 0.0–0.2)
PLATELETS: 295 10*3/uL (ref 150–400)
RBC: 5.16 MIL/uL — AB (ref 3.87–5.11)
RDW: 13.9 % (ref 11.5–15.5)
WBC: 19 10*3/uL — AB (ref 4.0–10.5)

## 2018-01-15 LAB — COMPREHENSIVE METABOLIC PANEL
ALBUMIN: 4.5 g/dL (ref 3.5–5.0)
ALK PHOS: 86 U/L (ref 38–126)
ALT: 23 U/L (ref 0–44)
ANION GAP: 8 (ref 5–15)
AST: 26 U/L (ref 15–41)
BILIRUBIN TOTAL: 1 mg/dL (ref 0.3–1.2)
BUN: 9 mg/dL (ref 8–23)
CALCIUM: 9.5 mg/dL (ref 8.9–10.3)
CO2: 27 mmol/L (ref 22–32)
Chloride: 100 mmol/L (ref 98–111)
Creatinine, Ser: 0.71 mg/dL (ref 0.44–1.00)
Glucose, Bld: 112 mg/dL — ABNORMAL HIGH (ref 70–99)
POTASSIUM: 4.3 mmol/L (ref 3.5–5.1)
Sodium: 135 mmol/L (ref 135–145)
TOTAL PROTEIN: 7.6 g/dL (ref 6.5–8.1)

## 2018-01-15 LAB — LACTIC ACID, PLASMA: Lactic Acid, Venous: 1 mmol/L (ref 0.5–1.9)

## 2018-01-15 MED ORDER — CLINDAMYCIN HCL 300 MG PO CAPS: 300.0000 mg | ORAL_CAPSULE | Freq: Three times a day (TID) | ORAL | 0 refills | Status: AC

## 2018-01-15 MED ORDER — IBUPROFEN 600 MG PO TABS: 600.0000 mg | ORAL_TABLET | Freq: Four times a day (QID) | ORAL | 0 refills | Status: DC | PRN

## 2018-01-15 MED ORDER — CLINDAMYCIN HCL 150 MG PO CAPS
300.0000 mg | ORAL_CAPSULE | Freq: Once | ORAL | Status: AC
  Administered 2018-01-15: 300 mg via ORAL
  Filled 2018-01-15: qty 2

## 2018-01-15 MED ORDER — IOHEXOL 300 MG/ML  SOLN
75.0000 mL | Freq: Once | INTRAMUSCULAR | Status: AC | PRN
  Administered 2018-01-15: 75 mL via INTRAVENOUS

## 2018-01-15 MED ORDER — CLINDAMYCIN PHOSPHATE 600 MG/50ML IV SOLN
600.0000 mg | Freq: Once | INTRAVENOUS | Status: AC
  Administered 2018-01-15: 600 mg via INTRAVENOUS
  Filled 2018-01-15: qty 50

## 2018-01-15 MED ORDER — KETOROLAC TROMETHAMINE 30 MG/ML IJ SOLN
30.0000 mg | Freq: Once | INTRAMUSCULAR | Status: AC
  Administered 2018-01-15: 30 mg via INTRAVENOUS
  Filled 2018-01-15: qty 1

## 2018-01-15 MED ORDER — FENTANYL CITRATE (PF) 100 MCG/2ML IJ SOLN
INTRAMUSCULAR | Status: AC
  Filled 2018-01-15: qty 2

## 2018-01-15 MED ORDER — DEXAMETHASONE SODIUM PHOSPHATE 10 MG/ML IJ SOLN
10.0000 mg | Freq: Once | INTRAMUSCULAR | Status: AC
  Administered 2018-01-15: 10 mg via INTRAVENOUS
  Filled 2018-01-15: qty 1

## 2018-01-15 MED ORDER — FENTANYL CITRATE (PF) 100 MCG/2ML IJ SOLN
50.0000 ug | INTRAMUSCULAR | Status: DC | PRN
  Administered 2018-01-15: 50 ug via INTRAVENOUS

## 2018-01-15 MED ORDER — DEXAMETHASONE 6 MG PO TABS: 12.0000 mg | ORAL_TABLET | Freq: Once | ORAL | 0 refills | Status: AC

## 2018-01-15 NOTE — ED Triage Notes (Signed)
PT to ED from [facility] with c/o possible cellulitis to RT side of face. PT had tooth extraction on Monday and since has had increased swelling and pain at site. PT denies fever. Pt appears uncomfortable. VSS

## 2018-01-15 NOTE — Discharge Instructions (Signed)
Start the antibiotic tomorrow and finish the full course.  You should also take the steroid (dexamethasone) for 1 dose tomorrow.  Follow-up with Dr. Andee Poles tomorrow afternoon at 1:15 PM.  As discussed, it is extremely important that you return to the emergency department IMMEDIATELY for new, worsening, persistent severe swelling, any new or worsening difficulty swallowing, any difficulty breathing, fevers, weakness, or any other new or worsening symptoms that concern you.

## 2018-01-15 NOTE — ED Provider Notes (Signed)
[facility] Emergency Department Provider Note ____________________________________________   First MD Initiated Contact with Patient 01/15/18 1635     (approximate)  I have reviewed the triage vital signs and the nursing notes.   HISTORY  Chief Complaint Facial Swelling    HPI [name] is a 62 y.o. female with PMH as noted below who presents with right-sided facial swelling over the last few days, gradual onset, worsening course, and associated with pain.  Patient reports some pain and difficulty with swallowing although she has been able to swallow solids and liquids.  She denies shortness of breath.  Patient had a wisdom tooth extraction on that side several days ago.  She went to the dentist today and was referred to the outpatient medicine clinic, where she was subsequently referred to the ER.  Patient reports subjective chills.  History reviewed. No pertinent past medical history.  There are no active problems to display for this patient.   Past Surgical History:  Procedure Laterality Date  . CARDIAC SURGERY    . CHOLECYSTECTOMY    . HIP ARTHROSCOPY      Prior to Admission medications   Not on File    Allergies Amoxicillin; Codeine; and Heparin  No family history on file.  Social History Social History   Tobacco Use  . Smoking status: Current Every Day Smoker  . Smokeless tobacco: Never Used  Substance Use Topics  . Alcohol use: Not Currently  . Drug use: Not Currently    Review of Systems  Constitutional: Positive for chills. Eyes: No redness. ENT: Positive for throat pain. Cardiovascular: Denies chest pain. Respiratory: Denies shortness of breath. Gastrointestinal: No nausea or vomiting.  Genitourinary: Negative for flank pain.  Musculoskeletal: Negative for back pain. Skin: Negative for rash. Neurological: Negative for headache.   ____________________________________________   PHYSICAL EXAM:  VITAL  SIGNS: ED Triage Vitals [01/15/18 1457]  Enc Vitals Group     BP (!) 152/75     Pulse Rate 77     Resp 16     Temp 98.2 F (36.8 C)     Temp Source Oral     SpO2 98 %     Weight 148 lb (67.1 kg)     Height 5\' 3"  (1.6 m)     Head Circumference      Peak Flow      Pain Score 10     Pain Loc      Pain Edu?      Excl. in GC?     Constitutional: Alert and oriented, in no acute distress. Eyes: Conjunctivae are normal.  Head: Atraumatic.  Right maxillary area swelling with slight erythema. Nose: No congestion/rhinnorhea. Mouth/Throat: Mucous membranes are moist.  Some difficulty fully opening the mouth due to pain although I am able to visualize posterior oropharynx.  No significant erythema or swelling. Neck: Normal range of motion.  No significant lymphadenopathy. Cardiovascular: Good peripheral circulation. Respiratory: Normal respiratory effort.   Gastrointestinal: No distention.  Musculoskeletal: Extremities warm and well perfused.  Neurologic:  Normal speech and language. No gross focal neurologic deficits are appreciated.  Skin:  Skin is warm and dry. No rash noted. Psychiatric: Mood and affect are normal. Speech and behavior are normal.  ____________________________________________   LABS (all labs ordered are listed, but only abnormal results are displayed)  Labs Reviewed  COMPREHENSIVE METABOLIC PANEL - Abnormal; Notable for the following components:      Result Value   Glucose, Bld 112 (*)  All other components within normal limits  CBC WITH DIFFERENTIAL/PLATELET - Abnormal; Notable for the following components:   WBC 19.0 (*)    RBC 5.16 (*)    Neutro Abs 11.8 (*)    Lymphs Abs 5.3 (*)    Monocytes Absolute 1.4 (*)    Abs Immature Granulocytes 0.10 (*)    All other components within normal limits  LACTIC ACID, PLASMA  LACTIC ACID, PLASMA  URINALYSIS, COMPLETE (UACMP) WITH MICROSCOPIC    ____________________________________________  EKG   ____________________________________________  RADIOLOGY   CT maxillofacial/neck: Right facial cellulitis.  Right-sided periapical abscess approximately 2.6 cm.  Asymmetric soft tissue swelling in the right nasopharynx and oropharynx with normal epiglottis, vallecula, and larynx.  ____________________________________________   PROCEDURES  Procedure(s) performed: No  Procedures  Critical Care performed: No ____________________________________________   INITIAL IMPRESSION / ASSESSMENT AND PLAN / ED COURSE  Pertinent labs & imaging results that were available during my care of the patient were reviewed by me and considered in my medical decision making (see chart for details).  62 year old female with PMH as noted above presents with right facial swelling over the last several days after having a wisdom tooth extracted on that side.  Patient was apparently given p.o. clindamycin by her dentist this morning but has not started it yet.  I reviewed the past medical records in Epic; the only relevant record is a note from [facility] internal medicine from this afternoon documenting the patient's referral to the ED for imaging and IV antibiotics.  On exam, the vital signs are normal.  The patient is alert and relatively comfortable appearing.  She has some swelling to the right side of her face but not extending into the neck.  She has some trismus although I am able to adequately visualize her oropharynx.  She has no swelling around or below the tongue.  Overall I suspect most likely facial cellulitis.  We will obtain CT maxillofacial and neck to evaluate for any airway compression or drainable fluid collection.  If this is negative I will consult ENT.  In the meantime I will give NSAID, Decadron and IV clindamycin as the patient is allergic to penicillins.  ----------------------------------------- 8:20 PM on  01/15/2018 -----------------------------------------  CT shows right facial cellulitis, with some involvement into the neck.  There is fullness in the right nasal and oropharynx but no other airway involvement.  She does have a small abscess arising from the right upper molars where the patient had her procedure a few days ago.  I consulted Dr. Andee Poles from ENT.  He advised that he would recommend admitting the patient for continued IV antibiotics at least until tomorrow, and agreed with the steroid and other management up to this point; he advised that if the patient was adamant about going home he would be able to see her tomorrow afternoon at 1:15 PM.  I had an extensive discussion with the patient about the results of the CT and the plan of care.  The patient is in fact adamant that she would like to go home.  She understands that if the infection worsens it could involve her airway and cause permanent disability, disfigurement, or death.  She states that she will return immediately if the symptoms worsen at all.  At this time the patient demonstrates appropriate understanding of her condition and the associated risks, and demonstrates appropriate decision-making capacity.  I counseled her on return precautions and she expresses understanding.  She agrees to see Dr. Andee Poles tomorrow  afternoon.  I will give an additional dose of p.o. clindamycin before discharge.  ____________________________________________   FINAL CLINICAL IMPRESSION(S) / ED DIAGNOSES  Final diagnoses:  Facial cellulitis      NEW MEDICATIONS STARTED DURING THIS VISIT:  New Prescriptions   No medications on file     Note:  This document was prepared using Dragon voice recognition software and may include unintentional dictation errors.    Dionne Bucy, MD 01/15/18 2024

## 2018-01-15 NOTE — ED Notes (Signed)
Pt reports facial right facial swelling with difficulty swallowing today.  Pt had tooth extraction this week and pt has had swelling for 3 days.  Pt alert.  Family with pt.  Iv in place.

## 2018-01-16 ENCOUNTER — Telehealth: Payer: Self-pay | Admitting: Emergency Medicine

## 2018-01-16 NOTE — Telephone Encounter (Addendum)
Called patient to check on condition and follow up.  Her CT scan has recommendations for further imaging in 3 months.  I left a message asking her to call me.    10/15--left another message.  Will send letter if she does not call me back.

## 2019-10-21 ENCOUNTER — Other Ambulatory Visit: Payer: Self-pay | Admitting: Internal Medicine

## 2020-01-20 ENCOUNTER — Other Ambulatory Visit: Payer: Self-pay | Admitting: *Deleted

## 2020-01-20 MED ORDER — OMEPRAZOLE 20 MG PO CPDR: 20.0000 mg | DELAYED_RELEASE_CAPSULE | Freq: Every day | ORAL | 3 refills | Status: DC

## 2020-06-07 ENCOUNTER — Other Ambulatory Visit: Payer: Self-pay | Admitting: Internal Medicine

## 2021-01-02 ENCOUNTER — Other Ambulatory Visit: Payer: Self-pay | Admitting: Internal Medicine

## 2021-03-21 ENCOUNTER — Ambulatory Visit (INDEPENDENT_AMBULATORY_CARE_PROVIDER_SITE_OTHER): Payer: Medicare Other | Admitting: *Deleted

## 2021-03-21 DIAGNOSIS — Z0000 Encounter for general adult medical examination without abnormal findings: Secondary | ICD-10-CM

## 2021-03-22 NOTE — Progress Notes (Signed)
Subjective:   [name] is a 65 y.o. female who presents for an Initial Medicare Annual Wellness Visit.  I discussed the limitations of evaluation and management by telemedicine and the availability of in person appointments. Patient expressed understanding and agreed to proceed.   Visit performed using audio  Patient:home Provider:home  Review of Systems    Defer to provider  Cardiac Risk Factors include: none     Objective:    There were no vitals filed for this visit. There is no height or weight on file to calculate BMI.  Advanced Directives 03/22/2021 01/15/2018  Does Patient Have a Medical Advance Directive? No No  Would patient like information on creating a medical advance directive? No - Patient declined No - Patient declined    Current Medications (verified) Outpatient Encounter Medications as of 03/21/2021  Medication Sig   enalapril (VASOTEC) 20 MG tablet TAKE 1 TABLET BY MOUTH ONCE A DAY   ibuprofen (ADVIL,MOTRIN) 600 MG tablet Take 1 tablet (600 mg total) by mouth every 6 (six) hours as needed.   omeprazole (PRILOSEC) 20 MG capsule TAKE 1 CAPSULE BY MOUTH ONCE DAILY   No facility-administered encounter medications on file as of 03/21/2021.    Allergies (verified) Amoxicillin, Codeine, and Heparin   History: History reviewed. No pertinent past medical history. Past Surgical History:  Procedure Laterality Date   CARDIAC SURGERY     CHOLECYSTECTOMY     HIP ARTHROSCOPY     History reviewed. No pertinent family history. Social History   Socioeconomic History   Marital status: Married    Spouse name: Not on file   Number of children: Not on file   Years of education: Not on file   Highest education level: Not on file  Occupational History   Not on file  Tobacco Use   Smoking status: Every Day   Smokeless tobacco: Never  Substance and Sexual Activity   Alcohol use: Not Currently   Drug use: Not Currently   Sexual activity: Not on file   Other Topics Concern   Not on file  Social History Narrative   Not on file   Social Determinants of Health   Financial Resource Strain: Low Risk    Difficulty of Paying Living Expenses: Not hard at all  Food Insecurity: No Food Insecurity   Worried About Running Out of Food in the Last Year: Never true   Ran Out of Food in the Last Year: Never true  Transportation Needs: No Transportation Needs   Lack of Transportation (Medical): No   Lack of Transportation (Non-Medical): No  Physical Activity: Insufficiently Active   Days of Exercise per Week: 2 days   Minutes of Exercise per Session: 10 min  Stress: No Stress Concern Present   Feeling of Stress : Not at all  Social Connections: Socially Isolated   Frequency of Communication with Friends and Family: More than three times a week   Frequency of Social Gatherings with Friends and Family: Not on file   Attends Religious Services: Never   Database administrator or Organizations: No   Attends Banker Meetings: Never   Marital Status: Widowed    Tobacco Counseling Ready to quit: Not Answered Counseling given: Not Answered   Clinical Intake:  Pre-visit preparation completed: No  Pain : No/denies pain     Nutritional Risks: None Diabetes: No  How often do you need to have someone help you when you read instructions, pamphlets, or other written materials  from your doctor or pharmacy?: 1 - Never What is the last grade level you completed in school?: high school  Diabetic?no  Interpreter Needed?: No  Information entered by :: Melody Comas, CMA   Activities of Daily Living In your present state of health, do you have any difficulty performing the following activities: 03/22/2021  Hearing? Y  Vision? N  Difficulty concentrating or making decisions? N  Walking or climbing stairs? Y  Dressing or bathing? N  Doing errands, shopping? N  Preparing Food and eating ? N  Using the Toilet? N  In the past  six months, have you accidently leaked urine? N  Do you have problems with loss of bowel control? N  Managing your Medications? N  Managing your Finances? N  Housekeeping or managing your Housekeeping? N  Some recent data might be hidden    Patient Care Team: Corky Downs, MD as PCP - General (Internal Medicine)  Indicate any recent Medical Services you may have received from other than [facility] providers in the past year (date may be approximate).     Assessment:   This is a routine wellness examination for [name].  Hearing/Vision screen No results found.  Dietary issues and exercise activities discussed: Current Exercise Habits: The patient does not participate in regular exercise at present, Exercise limited by: None identified   Goals Addressed   None    Depression Screen PHQ 2/9 Scores 03/22/2021 03/22/2021  PHQ - 2 Score 0 0    Fall Risk Fall Risk  03/22/2021  Falls in the past year? 0  Number falls in past yr: 0  Injury with Fall? 0  Risk for fall due to : No Fall Risks  Follow up Falls evaluation completed    FALL RISK PREVENTION PERTAINING TO THE HOME:  Any stairs in or around the home? No  If so, are there any without handrails? No  Home free of loose throw rugs in walkways, pet beds, electrical cords, etc? Yes  Adequate lighting in your home to reduce risk of falls? Yes   ASSISTIVE DEVICES UTILIZED TO PREVENT FALLS:  Life alert? No  Use of a cane, walker or w/c? No  Grab bars in the bathroom? No  Shower chair or bench in shower? No  Elevated toilet seat or a handicapped toilet? No   TIMED UP AND GO:  Was the test performed? No .  Length of time to ambulate- NA  Gait steady and fast without use of assistive device  Cognitive Function: MMSE - Mini Mental State Exam 03/22/2021  Not completed: Unable to complete     6CIT Screen 03/22/2021  What Year? 0 points  What month? 0 points  What time? 0 points  Count back from 20 0 points  Months in  reverse 0 points  Repeat phrase 0 points  Total Score 0    Immunizations  There is no immunization history on file for this patient.  TDAP status: Due, Education has been provided regarding the importance of this vaccine. Advised may receive this vaccine at local pharmacy or Health Dept. Aware to provide a copy of the vaccination record if obtained from local pharmacy or Health Dept. Verbalized acceptance and understanding.  Flu Vaccine status: Due, Education has been provided regarding the importance of this vaccine. Advised may receive this vaccine at local pharmacy or Health Dept. Aware to provide a copy of the vaccination record if obtained from local pharmacy or Health Dept. Verbalized acceptance and understanding.  Pneumococcal vaccine status:  Due, Education has been provided regarding the importance of this vaccine. Advised may receive this vaccine at local pharmacy or Health Dept. Aware to provide a copy of the vaccination record if obtained from local pharmacy or Health Dept. Verbalized acceptance and understanding.  Covid-19 vaccine status: Declined, Education has been provided regarding the importance of this vaccine but patient still declined. Advised may receive this vaccine at local pharmacy or Health Dept.or vaccine clinic. Aware to provide a copy of the vaccination record if obtained from local pharmacy or Health Dept. Verbalized acceptance and understanding.  Qualifies for Shingles Vaccine? Yes   Zostavax completed No   Shingrix Completed?: No.    Education has been provided regarding the importance of this vaccine. Patient has been advised to call insurance company to determine out of pocket expense if they have not yet received this vaccine. Advised may also receive vaccine at local pharmacy or Health Dept. Verbalized acceptance and understanding.  Screening Tests Health Maintenance  Topic Date Due   COVID-19 Vaccine (1) Never done   Pneumonia Vaccine 42+ Years old (1 -  PCV) Never done   Hepatitis C Screening  Never done   TETANUS/TDAP  Never done   MAMMOGRAM  Never done   Zoster Vaccines- Shingrix (1 of 2) Never done   COLONOSCOPY (Pts 45-61yrs Insurance coverage will need to be confirmed)  12/08/2014   DEXA SCAN  Never done   INFLUENZA VACCINE  Never done   HPV VACCINES  Aged Out    Health Maintenance  Health Maintenance Due  Topic Date Due   COVID-19 Vaccine (1) Never done   Pneumonia Vaccine 53+ Years old (1 - PCV) Never done   Hepatitis C Screening  Never done   TETANUS/TDAP  Never done   MAMMOGRAM  Never done   Zoster Vaccines- Shingrix (1 of 2) Never done   COLONOSCOPY (Pts 45-65yrs Insurance coverage will need to be confirmed)  12/08/2014   DEXA SCAN  Never done   INFLUENZA VACCINE  Never done    Colorectal cancer screening: Type of screening: Colonoscopy. Completed 2006. Repeat every 10 years  Patient declined mammogram    Lung Cancer Screening: (Low Dose CT Chest recommended if Age 12-80 years, 30 pack-year currently smoking OR have quit w/in 15years.) does not qualify.   Lung Cancer Screening Referral: NA  Additional Screening:  Hepatitis C Screening: does qualify  Vision Screening: Recommended annual ophthalmology exams for early detection of glaucoma and other disorders of the eye. Is the patient up to date with their annual eye exam?  Yes  Who is the provider or what is the name of the office in which the patient attends annual eye exams? [facility] If pt is not established with a provider, would they like to be referred to a provider to establish care? No .   Dental Screening: Recommended annual dental exams for proper oral hygiene  Community Resource Referral / Chronic Care Management: CRR required this visit?  No   CCM required this visit?  No      Plan:     I have personally reviewed and noted the following in the patients chart:   Medical and social history Use of alcohol, tobacco or illicit drugs   Current medications and supplements including opioid prescriptions. Patient is not currently taking opioid prescriptions. Functional ability and status Nutritional status Physical activity Advanced directives List of other physicians Hospitalizations, surgeries, and ER visits in previous 12 months Vitals Screenings to include cognitive, depression, and falls  Referrals and appointments  In addition, I have reviewed and discussed with patient certain preventive protocols, quality metrics, and best practice recommendations. A written personalized care plan for preventive services as well as general preventive health recommendations were provided to patient.     Melody Comas, [place]   03/22/2021   Nurse Notes:  Ms. Ricciardelli , Thank you for taking time to come for your Medicare Wellness Visit. I appreciate your ongoing commitment to your health goals. Please review the following plan we discussed and let me know if I can assist you in the future.   These are the goals we discussed:  Goals   None     This is a list of the screening recommended for you and due dates:  Health Maintenance  Topic Date Due   COVID-19 Vaccine (1) Never done   Pneumonia Vaccine (1 - PCV) Never done   Hepatitis C Screening: USPSTF Recommendation to screen - Ages 13-79 yo.  Never done   Tetanus Vaccine  Never done   Mammogram  Never done   Zoster (Shingles) Vaccine (1 of 2) Never done   Colon Cancer Screening  12/08/2014   DEXA scan (bone density measurement)  Never done   Flu Shot  Never done   HPV Vaccine  Aged Out     Time spent 30 min

## 2021-03-26 NOTE — Progress Notes (Signed)
I have reviewed this visit and agree with the documentation.   

## 2021-05-23 ENCOUNTER — Other Ambulatory Visit: Payer: Self-pay | Admitting: Internal Medicine

## 2022-01-01 ENCOUNTER — Other Ambulatory Visit: Payer: Self-pay | Admitting: Internal Medicine

## 2023-03-03 ENCOUNTER — Other Ambulatory Visit: Payer: Self-pay

## 2023-03-03 ENCOUNTER — Emergency Department (HOSPITAL_BASED_OUTPATIENT_CLINIC_OR_DEPARTMENT_OTHER)
Admission: EM | Admit: 2023-03-03 | Discharge: 2023-03-04 | Disposition: A | Discharge status: Other Acute Inpatient Hospital | Payer: Medicare Other | Attending: Emergency Medicine | Admitting: Emergency Medicine

## 2023-03-03 ENCOUNTER — Emergency Department (HOSPITAL_BASED_OUTPATIENT_CLINIC_OR_DEPARTMENT_OTHER): Payer: Medicare Other

## 2023-03-03 ENCOUNTER — Emergency Department (HOSPITAL_BASED_OUTPATIENT_CLINIC_OR_DEPARTMENT_OTHER): Payer: Medicare Other | Admitting: Radiology

## 2023-03-03 ENCOUNTER — Encounter (HOSPITAL_BASED_OUTPATIENT_CLINIC_OR_DEPARTMENT_OTHER): Payer: Self-pay

## 2023-03-03 DIAGNOSIS — D649 Anemia, unspecified: Secondary | ICD-10-CM | POA: Insufficient documentation

## 2023-03-03 DIAGNOSIS — Z20822 Contact with and (suspected) exposure to covid-19: Secondary | ICD-10-CM | POA: Insufficient documentation

## 2023-03-03 DIAGNOSIS — F17200 Nicotine dependence, unspecified, uncomplicated: Secondary | ICD-10-CM | POA: Diagnosis not present

## 2023-03-03 DIAGNOSIS — D72829 Elevated white blood cell count, unspecified: Secondary | ICD-10-CM | POA: Diagnosis not present

## 2023-03-03 DIAGNOSIS — D696 Thrombocytopenia, unspecified: Secondary | ICD-10-CM | POA: Diagnosis not present

## 2023-03-03 DIAGNOSIS — D539 Nutritional anemia, unspecified: Secondary | ICD-10-CM

## 2023-03-03 DIAGNOSIS — R112 Nausea with vomiting, unspecified: Secondary | ICD-10-CM | POA: Diagnosis present

## 2023-03-03 HISTORY — DX: Gastro-esophageal reflux disease without esophagitis: K21.9

## 2023-03-03 LAB — URINALYSIS, ROUTINE W REFLEX MICROSCOPIC

## 2023-03-03 LAB — COMPREHENSIVE METABOLIC PANEL
ALT: 9 U/L (ref 0–44)
AST: 15 U/L (ref 15–41)
Albumin: 3.9 g/dL (ref 3.5–5.0)
Alkaline Phosphatase: 69 U/L (ref 38–126)
Anion gap: 6 (ref 5–15)
BUN: 13 mg/dL (ref 8–23)
CO2: 27 mmol/L (ref 22–32)
Calcium: 8.5 mg/dL — ABNORMAL LOW (ref 8.9–10.3)
Chloride: 98 mmol/L (ref 98–111)
Creatinine, Ser: 0.77 mg/dL (ref 0.44–1.00)
GFR, Estimated: >60 mL/min (ref >60)
Glucose, Bld: 106 mg/dL — ABNORMAL HIGH (ref 70–99)
Potassium: 3.6 mmol/L (ref 3.5–5.1)
Sodium: 131 mmol/L — ABNORMAL LOW (ref 135–145)
Total Bilirubin: 0.7 mg/dL (ref <1.2)
Total Protein: 6.5 g/dL (ref 6.5–8.1)

## 2023-03-03 LAB — CBC
HCT: 21.8 % — ABNORMAL LOW (ref 36.0–46.0)
Hemoglobin: 6.7 g/dL — CL (ref 12.0–15.0)
MCH: 35.8 pg — ABNORMAL HIGH (ref 26.0–34.0)
MCHC: 30.7 g/dL (ref 30.0–36.0)
MCV: 116.6 fL — ABNORMAL HIGH (ref 80.0–100.0)
Platelets: 17 10*3/uL — CL (ref 150–400)
RBC: 1.87 MIL/uL — ABNORMAL LOW (ref 3.87–5.11)
RDW: 20 % — ABNORMAL HIGH (ref 11.5–15.5)
WBC: 42 10*3/uL — ABNORMAL HIGH (ref 4.0–10.5)
nRBC: 1.6 % — ABNORMAL HIGH (ref 0.0–0.2)

## 2023-03-03 LAB — LIPASE, BLOOD: Lipase: 24 U/L (ref 11–51)

## 2023-03-03 LAB — LACTIC ACID, PLASMA: Lactic Acid, Venous: 1.4 mmol/L (ref 0.5–1.9)

## 2023-03-03 MED ORDER — VANCOMYCIN HCL IN DEXTROSE 1-5 GM/200ML-% IV SOLN
1000.0000 mg | Freq: Once | INTRAVENOUS | Status: AC
Start: 2023-03-03 — End: 2023-03-03
  Administered 2023-03-03: 1000 mg via INTRAVENOUS
  Filled 2023-03-03: qty 200

## 2023-03-03 MED ORDER — ONDANSETRON HCL 4 MG/2ML IJ SOLN
4.0000 mg | Freq: Once | INTRAMUSCULAR | Status: AC
  Administered 2023-03-03: 4 mg via INTRAVENOUS
  Filled 2023-03-03: qty 2

## 2023-03-03 MED ORDER — FENTANYL CITRATE PF 50 MCG/ML IJ SOSY
50.0000 ug | PREFILLED_SYRINGE | Freq: Once | INTRAMUSCULAR | Status: AC
  Administered 2023-03-03: 50 ug via INTRAVENOUS
  Filled 2023-03-03: qty 1

## 2023-03-03 MED ORDER — SODIUM CHLORIDE 0.9 % IV SOLN
2.0000 g | Freq: Once | INTRAVENOUS | Status: AC
  Administered 2023-03-03: 2 g via INTRAVENOUS
  Filled 2023-03-03: qty 12.5

## 2023-03-03 MED ORDER — IOHEXOL 300 MG/ML  SOLN
100.0000 mL | Freq: Once | INTRAMUSCULAR | Status: AC | PRN
  Administered 2023-03-03: 80 mL via INTRAVENOUS

## 2023-03-03 MED ORDER — LACTATED RINGERS IV BOLUS
1000.0000 mL | Freq: Once | INTRAVENOUS | Status: AC
  Administered 2023-03-03: 1000 mL via INTRAVENOUS

## 2023-03-03 NOTE — Progress Notes (Signed)
ED Pharmacy Antibiotic Sign Off An antibiotic consult was received from an ED provider for cefepime and vancomycin per pharmacy dosing for sepsis. A chart review was completed to assess appropriateness.  The following one time order(s) were placed per pharmacy consult:  cefepime 2000 mg x 1 dose vancomycin 1000 mg x 1 dose  Further antibiotic and/or antibiotic pharmacy consults should be ordered by the admitting provider if indicated.   Thank you for allowing pharmacy to be a part of this patient's care.   Delmar Landau, PharmD, BCPS 03/03/2023 8:58 PM ED Clinical Pharmacist -  [phone]

## 2023-03-03 NOTE — ED Provider Notes (Signed)
[place] EMERGENCY DEPARTMENT AT [facility] Provider Note  CSN: [id number] Arrival date & time: 03/03/23 1923  Chief Complaint(s) Abdominal Pain  HPI [name] is a 67 y.o. female ED for 3 weeks of nausea.  Patient tells me that she thought that she just had COVID, and that her symptoms are improved but they have not.  Since she has been having difficult time eating.  She says that a couple of days ago she thought she might be developing a UTI, so she took some Azo at home.  Patient does not regularly seek medical care.  She tells me that she stopped going to doctors about 20 years ago after she had her gallbladder out and her problems persisted.  She denies any weight loss, no recent travel.   Past Medical History Past Medical History:  Diagnosis Date   GERD (gastroesophageal reflux disease)    There are no problems to display for this patient.  Home Medication(s) Prior to Admission medications   Medication Sig Start Date End Date Taking? Authorizing Provider  enalapril (VASOTEC) 20 MG tablet TAKE 1 TABLET BY MOUTH ONCE A DAY 05/24/21   Corky Downs, MD  ibuprofen (ADVIL,MOTRIN) 600 MG tablet Take 1 tablet (600 mg total) by mouth every 6 (six) hours as needed. 01/15/18   Dionne Bucy, MD  omeprazole (PRILOSEC) 20 MG capsule TAKE 1 CAPSULE BY MOUTH ONCE DAILY 01/01/22   Corky Downs, MD                                                                                                                                    Past Surgical History Past Surgical History:  Procedure Laterality Date   CARDIAC SURGERY     CHOLECYSTECTOMY     HIP ARTHROSCOPY     Family History History reviewed. No pertinent family history.  Social History Social History   Tobacco Use   Smoking status: Every Day   Smokeless tobacco: Never  Substance Use Topics   Alcohol use: Not Currently   Drug use: Not Currently   Allergies Amoxicillin, Codeine, and Heparin  Review of  Systems Review of Systems  Physical Exam Vital Signs  I have reviewed the triage vital signs BP (!) 132/58   Pulse 93   Temp 98.1 F (36.7 C) (Oral)   Resp 17   Ht 5' 5.5" (1.664 m)   Wt 63 kg   SpO2 92%   BMI 22.78 kg/m   Physical Exam  ED Results and Treatments Labs (all labs ordered are listed, but only abnormal results are displayed) Labs Reviewed  COMPREHENSIVE METABOLIC PANEL - Abnormal; Notable for the following components:      Result Value   Sodium 131 (*)    Glucose, Bld 106 (*)    Calcium 8.5 (*)    All other components within normal limits  CBC - Abnormal; Notable for the following components:   WBC 42.0 (*)  RBC 1.87 (*)    Hemoglobin 6.7 (*)    HCT 21.8 (*)    MCV 116.6 (*)    MCH 35.8 (*)    RDW 20.0 (*)    Platelets 17 (*)    nRBC 1.6 (*)    All other components within normal limits  URINALYSIS, ROUTINE W REFLEX MICROSCOPIC - Abnormal; Notable for the following components:   Color, Urine ORANGE (*)    APPearance CLEAR (*)    Glucose, UA   (*)    Value: TEST NOT REPORTED DUE TO COLOR INTERFERENCE OF URINE PIGMENT   Hgb urine dipstick   (*)    Value: TEST NOT REPORTED DUE TO COLOR INTERFERENCE OF URINE PIGMENT   Bilirubin Urine   (*)    Value: TEST NOT REPORTED DUE TO COLOR INTERFERENCE OF URINE PIGMENT   Ketones, ur   (*)    Value: TEST NOT REPORTED DUE TO COLOR INTERFERENCE OF URINE PIGMENT   Protein, ur   (*)    Value: TEST NOT REPORTED DUE TO COLOR INTERFERENCE OF URINE PIGMENT   Nitrite   (*)    Value: TEST NOT REPORTED DUE TO COLOR INTERFERENCE OF URINE PIGMENT   Leukocytes,Ua   (*)    Value: TEST NOT REPORTED DUE TO COLOR INTERFERENCE OF URINE PIGMENT   Bacteria, UA RARE (*)    All other components within normal limits  CULTURE, BLOOD (ROUTINE X 2)  CULTURE, BLOOD (ROUTINE X 2)  LIPASE, BLOOD  LACTIC ACID, PLASMA  SEDIMENTATION RATE  C-REACTIVE PROTEIN                                                                                                                           Radiology CT ABDOMEN PELVIS W CONTRAST  Result Date: 03/03/2023 CLINICAL DATA:  Sick to her stomach for 2 weeks. Nausea and vomiting. Abdominal pain. EXAM: CT ABDOMEN AND PELVIS WITH CONTRAST TECHNIQUE: Multidetector CT imaging of the abdomen and pelvis was performed using the standard protocol following bolus administration of intravenous contrast. RADIATION DOSE REDUCTION: This exam was performed according to the departmental dose-optimization program which includes automated exposure control, adjustment of the mA and/or kV according to patient size and/or use of iterative reconstruction technique. CONTRAST:  80mL OMNIPAQUE IOHEXOL 300 MG/ML  SOLN COMPARISON:  CT 03/07/2004 FINDINGS: Lower chest: No acute abnormality. Hepatobiliary: Unremarkable liver. Cholecystectomy. No biliary dilation. Pancreas: Unremarkable. Spleen: Splenomegaly measuring 16.3 cm in craniocaudal dimension. Adrenals/Urinary Tract: Normal adrenal glands. No urinary calculi or hydronephrosis. Bladder is unremarkable. Stomach/Bowel: Normal caliber large and small bowel. No bowel wall thickening. The appendix is normal.Stomach is within normal limits. Vascular/Lymphatic: Aortic atherosclerosis. Question mild wall thickening about the distal abdominal aorta extending along the proximal right common iliac artery with subtle adjacent stranding (circa series 2/image 36-42). No enlarged abdominal or pelvic lymph nodes. Numerous subcentimeter mesenteric lymph nodes. Reproductive: Calcified fibroids in the uterus.  No adnexal mass. Other: No free intraperitoneal fluid or air.  Musculoskeletal: No acute fracture. IMPRESSION: 1. Question mild wall thickening about the distal abdominal aorta extending along the proximal right common iliac artery with subtle adjacent stranding. This is nonspecific but can be seen in the setting of vasculitis. Correlate with symptoms and inflammatory markers. If desired, this  could be further evaluated with MRA of the abdomen and pelvis. 2. Splenomegaly. Aortic Atherosclerosis (ICD10-I70.0). Electronically Signed   By: Minerva Fester M.D.   On: 03/03/2023 22:39   DG Chest 1 View  Result Date: 03/03/2023 CLINICAL DATA:  Nausea and vomiting x2 weeks. EXAM: CHEST  1 VIEW COMPARISON:  January 15, 2011 FINDINGS: The heart size and mediastinal contours are within normal limits. Mild linear scarring and/or atelectasis is seen along the periphery of the left lung base. There is no evidence of an acute infiltrate, pleural effusion or pneumothorax. Radiopaque surgical clips are seen within the right upper quadrant. The visualized skeletal structures are unremarkable. IMPRESSION: Mild left basilar linear scarring and/or atelectasis. Electronically Signed   By: Aram Candela M.D.   On: 03/03/2023 22:25    Pertinent labs & imaging results that were available during my care of the patient were reviewed by me and considered in my medical decision making (see MDM for details).  Medications Ordered in ED Medications  vancomycin (VANCOCIN) IVPB 1000 mg/200 mL premix (1,000 mg Intravenous New Bag/Given 03/03/23 2213)  lactated ringers bolus 1,000 mL (0 mLs Intravenous Stopped 03/03/23 2236)  ceFEPIme (MAXIPIME) 2 g in sodium chloride 0.9 % 100 mL IVPB (0 g Intravenous Stopped 03/03/23 2211)  iohexol (OMNIPAQUE) 300 MG/ML solution 100 mL (80 mLs Intravenous Contrast Given 03/03/23 2111)  ondansetron (ZOFRAN) injection 4 mg (4 mg Intravenous Given 03/03/23 2232)  fentaNYL (SUBLIMAZE) injection 50 mcg (50 mcg Intravenous Given 03/03/23 2232)                                                                                                                                     Procedures Procedures  (including critical care time)  Medical Decision Making / ED Course   This patient presents to the ED for concern of nausea, this involves an extensive number of treatment options, and is  a complaint that carries with it a high risk of complications and morbidity.  The differential diagnosis includes intra-abdominal infection, malignancy, less likely sepsis, ITP, anemia.  MDM: On exam, patient overall looks okay.  Her abdomen is soft, nondistended, no tenderness.  Will treat the patient's nausea with some Zofran, analgesia provided.  CT imaging of the abdomen and pelvis ordered.  Patient's labs returned and showed a white count of 42, so added on blood cultures and broad-spectrum antibiotics for the patient.  However she is anemic, thrombocytopenic.  Have concern for hematological process more so than acute infectious process.  Currently at freestanding ED, unable to obtain type and screen, or provide platelets.  To that end, believe patient would benefit from  being transferred to a higher level of care in the event that she were to require administration of products.  Believe patient is reasonably stabilized at this point despite her workup not being completed.  Have reached out to hospitalist, will discuss admission versus ED to ED transfer.  Reassessment 10:47 PM-patient's CT imaging shows question of vasculitis.  Have added on sed rate and CRP.  Still awaiting callback from hospitalist.  Considered adding steroids for the patient, however patient is diagnosis not clear, is stable.  Of note, patient mentions that she was treated for strep pharyngitis 1 to 2 months ago.  Says that she did have a positive strep swab.   Additional history obtained: -Additional history obtained from daughter at bedside -External records from outside source obtained and reviewed including: Chart review including previous notes, labs, imaging, consultation notes   Lab Tests: -I ordered, reviewed, and interpreted labs.   The pertinent results include:   Labs Reviewed  COMPREHENSIVE METABOLIC PANEL - Abnormal; Notable for the following components:      Result Value   Sodium 131 (*)    Glucose,  Bld 106 (*)    Calcium 8.5 (*)    All other components within normal limits  CBC - Abnormal; Notable for the following components:   WBC 42.0 (*)    RBC 1.87 (*)    Hemoglobin 6.7 (*)    HCT 21.8 (*)    MCV 116.6 (*)    MCH 35.8 (*)    RDW 20.0 (*)    Platelets 17 (*)    nRBC 1.6 (*)    All other components within normal limits  URINALYSIS, ROUTINE W REFLEX MICROSCOPIC - Abnormal; Notable for the following components:   Color, Urine ORANGE (*)    APPearance CLEAR (*)    Glucose, UA   (*)    Value: TEST NOT REPORTED DUE TO COLOR INTERFERENCE OF URINE PIGMENT   Hgb urine dipstick   (*)    Value: TEST NOT REPORTED DUE TO COLOR INTERFERENCE OF URINE PIGMENT   Bilirubin Urine   (*)    Value: TEST NOT REPORTED DUE TO COLOR INTERFERENCE OF URINE PIGMENT   Ketones, ur   (*)    Value: TEST NOT REPORTED DUE TO COLOR INTERFERENCE OF URINE PIGMENT   Protein, ur   (*)    Value: TEST NOT REPORTED DUE TO COLOR INTERFERENCE OF URINE PIGMENT   Nitrite   (*)    Value: TEST NOT REPORTED DUE TO COLOR INTERFERENCE OF URINE PIGMENT   Leukocytes,Ua   (*)    Value: TEST NOT REPORTED DUE TO COLOR INTERFERENCE OF URINE PIGMENT   Bacteria, UA RARE (*)    All other components within normal limits  CULTURE, BLOOD (ROUTINE X 2)  CULTURE, BLOOD (ROUTINE X 2)  LIPASE, BLOOD  LACTIC ACID, PLASMA  SEDIMENTATION RATE  C-REACTIVE PROTEIN      EKG my independent review the patient's EKG shows no ST segment depressions or elevations, no T wave inversions, no evidence of acute ischemia.  EKG Interpretation Date/Time:  Monday March 03 2023 19:50:17 EST Ventricular Rate:  98 PR Interval:  166 QRS Duration:  84 QT Interval:  342 QTC Calculation: 436 R Axis:   44  Text Interpretation: Normal sinus rhythm Cannot rule out Anterior infarct , age undetermined Abnormal ECG When compared with ECG of 12-Apr-2008 04:26, No significant change was found Confirmed by Anders Simmonds [phone]) on 03/03/2023 10:47:30  PM  Imaging Studies ordered: I ordered imaging studies including chest x-ray, CT imaging of the abdomen pelvis I independently visualized and interpreted imaging. I agree with the radiologist interpretation   Medicines ordered and prescription drug management: Meds ordered this encounter  Medications   lactated ringers bolus 1,000 mL   ceFEPIme (MAXIPIME) 2 g in sodium chloride 0.9 % 100 mL IVPB    Order Specific Question:   Antibiotic Indication:    Answer:   Sepsis   vancomycin (VANCOCIN) IVPB 1000 mg/200 mL premix    Order Specific Question:   Indication:    Answer:   Sepsis   iohexol (OMNIPAQUE) 300 MG/ML solution 100 mL   ondansetron (ZOFRAN) injection 4 mg   fentaNYL (SUBLIMAZE) injection 50 mcg    -I have reviewed the patients home medicines and have made adjustments as needed    Cardiac Monitoring: The patient was maintained on a cardiac monitor.  I personally viewed and interpreted the cardiac monitored which showed an underlying rhythm of: Sinus rhythm  Social Determinants of Health:  Factors impacting patients care include: Lack of access to primary care   Reevaluation: After the interventions noted above, I reevaluated the patient and found that they have :improved  Co morbidities that complicate the patient evaluation  Past Medical History:  Diagnosis Date   GERD (gastroesophageal reflux disease)       Dispostion: Admission     Final Clinical Impression(s) / ED Diagnoses Final diagnoses:  Thrombocytopenia (HCC)  Anemia, unspecified type  Leukocytosis, unspecified type     @PCDICTATION @    Anders Simmonds T, DO 03/03/23 2309

## 2023-03-03 NOTE — ED Notes (Signed)
ED Provider at bedside. 

## 2023-03-03 NOTE — ED Triage Notes (Signed)
Pt states that she has been "sick on her stomach" x2 weeks. Pt reports nausea and vomiting.

## 2023-03-03 NOTE — Plan of Care (Incomplete)
Plan of Care Note for accepted transfer   Patient name: [name] ZOX:[id number] DOB: December 02, 1955  Facility requesting transfer: [facility] Requesting Provider: Dr. Andria Meuse Facility course: 67 year old female with history of hypertension, GERD presented to the ED with complaint of nausea and generalized weakness/malaise for the past 3 weeks and recent urinary symptoms.  Vital signs stable on arrival.  CBC notable for WBC count 42.0, hemoglobin 6.7, platelet count 17k.  Repeat CBC with differential currently pending.  CMP notable for sodium 131, normal LFTs.  Lipase is normal.  Urine orange in color/UA could not be analyzed due to color interference of urine pigment.  Urine microscopy showing 6-10 RBCs, 21-50 WBCs, and rare bacteria.  Lactic acid 1.4.  Blood cultures collected.    Chest x-ray showing mild left basilar linear scarring and/or atelectasis.    CT abdomen pelvis with contrast showing: IMPRESSION: 1. Question mild wall thickening about the distal abdominal aorta extending along the proximal right common iliac artery with subtle adjacent stranding. This is nonspecific but can be seen in the setting of vasculitis. Correlate with symptoms and inflammatory markers. If desired, this could be further evaluated with MRA of the abdomen and pelvis. 2. Splenomegaly.   Aortic Atherosclerosis (ICD10-I70.0).  ESR and CRP pending.  Patient was given fentanyl, Zofran, cefepime, vancomycin, and 1 L LR.  Plan of care: The patient is accepted for admission to {Level_of_Care:26774} unit at {Campus_List:26775}.  [facility] will assume care on arrival to accepting facility. Until arrival, care as per EDP. However, [facility] available 24/7 for questions and assistance.  Check [email or web address] for on-call coverage.  Nursing staff, please call [facility] Admits & Consults System-Wide number under Amion on patient's arrival so appropriate admitting provider can evaluate the pt.

## 2023-03-04 DIAGNOSIS — D696 Thrombocytopenia, unspecified: Secondary | ICD-10-CM | POA: Diagnosis not present

## 2023-03-04 DIAGNOSIS — I700 Atherosclerosis of aorta: Secondary | ICD-10-CM | POA: Insufficient documentation

## 2023-03-04 DIAGNOSIS — D72829 Elevated white blood cell count, unspecified: Secondary | ICD-10-CM | POA: Diagnosis not present

## 2023-03-04 DIAGNOSIS — R112 Nausea with vomiting, unspecified: Secondary | ICD-10-CM | POA: Diagnosis present

## 2023-03-04 DIAGNOSIS — D649 Anemia, unspecified: Secondary | ICD-10-CM | POA: Diagnosis not present

## 2023-03-04 DIAGNOSIS — Z20822 Contact with and (suspected) exposure to covid-19: Secondary | ICD-10-CM | POA: Diagnosis not present

## 2023-03-04 DIAGNOSIS — F17200 Nicotine dependence, unspecified, uncomplicated: Secondary | ICD-10-CM | POA: Diagnosis not present

## 2023-03-04 LAB — CBC WITH DIFFERENTIAL/PLATELET
Abs Immature Granulocytes: 4.69 10*3/uL — ABNORMAL HIGH (ref 0.00–0.07)
Basophils Absolute: 0.1 10*3/uL (ref 0.0–0.1)
Basophils Relative: 0 %
Eosinophils Absolute: 0.4 10*3/uL (ref 0.0–0.5)
Eosinophils Relative: 1 %
HCT: 19.7 % — ABNORMAL LOW (ref 36.0–46.0)
Hemoglobin: 6.1 g/dL — CL (ref 12.0–15.0)
Immature Granulocytes: 14 %
Lymphocytes Relative: 32 %
Lymphs Abs: 11.2 10*3/uL — ABNORMAL HIGH (ref 0.7–4.0)
MCH: 35.7 pg — ABNORMAL HIGH (ref 26.0–34.0)
MCHC: 31 g/dL (ref 30.0–36.0)
MCV: 115.2 fL — ABNORMAL HIGH (ref 80.0–100.0)
Monocytes Absolute: 9.8 10*3/uL — ABNORMAL HIGH (ref 0.1–1.0)
Monocytes Relative: 28 %
Neutro Abs: 8.7 10*3/uL — ABNORMAL HIGH (ref 1.7–7.7)
Neutrophils Relative %: 25 %
Platelets: 14 10*3/uL — CL (ref 150–400)
RBC: 1.71 MIL/uL — ABNORMAL LOW (ref 3.87–5.11)
RDW: 19.7 % — ABNORMAL HIGH (ref 11.5–15.5)
WBC: 34.9 10*3/uL — ABNORMAL HIGH (ref 4.0–10.5)
nRBC: 1.1 % — ABNORMAL HIGH (ref 0.0–0.2)

## 2023-03-04 LAB — SEDIMENTATION RATE: Sed Rate: 48 mm/h — ABNORMAL HIGH (ref 0–22)

## 2023-03-04 LAB — C-REACTIVE PROTEIN: CRP: 2.3 mg/dL — ABNORMAL HIGH (ref <1.0)

## 2023-03-04 LAB — PATHOLOGIST SMEAR REVIEW

## 2023-03-04 LAB — SARS CORONAVIRUS 2 BY RT PCR: SARS Coronavirus 2 by RT PCR: NEGATIVE

## 2023-03-04 MED ORDER — MORPHINE SULFATE (PF) 4 MG/ML IV SOLN
4.0000 mg | Freq: Once | INTRAVENOUS | Status: AC
  Administered 2023-03-04: 4 mg via INTRAVENOUS
  Filled 2023-03-04: qty 1

## 2023-03-04 MED ORDER — ONDANSETRON HCL 4 MG/2ML IJ SOLN
4.0000 mg | Freq: Once | INTRAMUSCULAR | Status: AC
  Administered 2023-03-04: 4 mg via INTRAVENOUS
  Filled 2023-03-04: qty 2

## 2023-03-04 MED ORDER — ONDANSETRON HCL 4 MG/2ML IJ SOLN
4.0000 mg | Freq: Four times a day (QID) | INTRAMUSCULAR | Status: DC | PRN
  Administered 2023-03-04: 4 mg via INTRAVENOUS
  Filled 2023-03-04: qty 2

## 2023-03-04 NOTE — ED Provider Notes (Signed)
Care assumed from Dr. Andria Meuse.  On transfer of care, I was awaiting call from hospitalist.  I have discussed case with Dr. Loney Loh, who is concerned that patient may need specialty care not available in our system.  I did discuss the case with Dr. Myna Hidalgo of oncology service who felt that she would best be managed at a tertiary care center.  Patient requested transfer to [facility].  I talked with their PALS line, but they had no beds available.  I have discussed the case with [facility].  They state that she will be placed on administrative review and will get back to me at about 6:30 AM.  I have discussed the case with the transfer center at [facility], they state they will have to get back with me.  [facility] transfer center states they are unable to accept the patient because of bed availability.  [facility] states they can accept the patient.  I have discussed the case with Lindaann Pascal, MD, who agrees to accept the patient.  She will requests COVID test to be done, and I have ordered this.  [facility] will arrange for transportation.   Dione Booze, MD 03/04/23 [phone]

## 2023-03-04 NOTE — ED Notes (Signed)
ED Provider updated pt on plan at this time. Pt ambulated to restroom with steady gait. Sitting at bedside in chair with her friend at this time.

## 2023-03-04 NOTE — ED Notes (Signed)
Monisha with cl called for transport for [facility]

## 2023-03-04 NOTE — ED Notes (Signed)
Clydie Braun from [facility] bed control called with room number 1115. Will call report to number: [phone].

## 2023-03-04 NOTE — ED Notes (Signed)
Report given to [employer] [facility]) and [employer]. [employer] stated they did not have a truck available until after 1900 today and wanted [place] to reach out to [facility] for transportation.

## 2023-03-04 NOTE — ED Notes (Signed)
Rn spoke with [facility] Transfer services about next steps for patient to be transferred to [facility] system. Provider notified and patient updated.

## 2023-03-04 NOTE — ED Provider Notes (Addendum)
COVID is negative.  Will recontact the transfer service.  They were waiting on those results.  Patient to be transferred to medicine service at [facility]   Vanetta Mulders, MD 03/04/23 [phone]  Discussed with oncology service at [facility].  They are still planning on this being a medicine service admission.  Still awaiting [facility] transportation.   Vanetta Mulders, MD 03/04/23 1116

## 2023-03-04 NOTE — ED Notes (Signed)
Report given to [facility]. ETA 20 minutes

## 2023-03-09 LAB — CULTURE, BLOOD (ROUTINE X 2)
Culture: NO GROWTH
Culture: NO GROWTH
Special Requests: ADEQUATE
Special Requests: ADEQUATE

## 2023-03-10 DIAGNOSIS — C9200 Acute myeloblastic leukemia, not having achieved remission: Secondary | ICD-10-CM | POA: Insufficient documentation

## 2023-05-06 ENCOUNTER — Telehealth: Payer: Self-pay | Admitting: *Deleted

## 2023-05-06 ENCOUNTER — Inpatient Hospital Stay: Payer: Medicare Other | Attending: Oncology | Admitting: Oncology

## 2023-05-06 ENCOUNTER — Encounter: Payer: Self-pay | Admitting: Oncology

## 2023-05-06 ENCOUNTER — Inpatient Hospital Stay: Payer: Medicare Other

## 2023-05-06 VITALS — BP 105/76 | HR 103 | Temp 98.1°F | Resp 20 | Wt 153.4 lb

## 2023-05-06 DIAGNOSIS — Z7189 Other specified counseling: Secondary | ICD-10-CM

## 2023-05-06 DIAGNOSIS — F17210 Nicotine dependence, cigarettes, uncomplicated: Secondary | ICD-10-CM | POA: Diagnosis not present

## 2023-05-06 DIAGNOSIS — C9200 Acute myeloblastic leukemia, not having achieved remission: Secondary | ICD-10-CM | POA: Insufficient documentation

## 2023-05-06 DIAGNOSIS — Z7964 Long term (current) use of myelosuppressive agent: Secondary | ICD-10-CM | POA: Diagnosis not present

## 2023-05-06 DIAGNOSIS — Z79899 Other long term (current) drug therapy: Secondary | ICD-10-CM | POA: Insufficient documentation

## 2023-05-06 LAB — CBC WITH DIFFERENTIAL (CANCER CENTER ONLY)
Abs Immature Granulocytes: 0.4 10*3/uL — ABNORMAL HIGH (ref 0.00–0.07)
Basophils Absolute: 0 10*3/uL (ref 0.0–0.1)
Basophils Relative: 0 %
Blasts: 33 %
Eosinophils Absolute: 0 10*3/uL (ref 0.0–0.5)
Eosinophils Relative: 0 %
HCT: 27.4 % — ABNORMAL LOW (ref 36.0–46.0)
Hemoglobin: 9.2 g/dL — ABNORMAL LOW (ref 12.0–15.0)
Lymphocytes Relative: 39 %
Lymphs Abs: 5.1 10*3/uL — ABNORMAL HIGH (ref 0.7–4.0)
MCH: 30.6 pg (ref 26.0–34.0)
MCHC: 33.6 g/dL (ref 30.0–36.0)
MCV: 91 fL (ref 80.0–100.0)
Metamyelocytes Relative: 1 %
Monocytes Absolute: 1.7 10*3/uL — ABNORMAL HIGH (ref 0.1–1.0)
Monocytes Relative: 13 %
Myelocytes: 2 %
Neutro Abs: 1.6 10*3/uL — ABNORMAL LOW (ref 1.7–7.7)
Neutrophils Relative %: 12 %
Platelet Count: 14 10*3/uL — ABNORMAL LOW (ref 150–400)
RBC: 3.01 MIL/uL — ABNORMAL LOW (ref 3.87–5.11)
RDW: 19.5 % — ABNORMAL HIGH (ref 11.5–15.5)
Smear Review: NORMAL
WBC Count: 13.2 10*3/uL — ABNORMAL HIGH (ref 4.0–10.5)
nRBC: 0.6 % — ABNORMAL HIGH (ref 0.0–0.2)

## 2023-05-06 LAB — SAMPLE TO BLOOD BANK

## 2023-05-06 LAB — ABO/RH: ABO/RH(D): A POS

## 2023-05-06 LAB — PATHOLOGIST SMEAR REVIEW

## 2023-05-06 NOTE — Telephone Encounter (Signed)
Called and informed patient that per Dr. Smith [name] she will not need a transfusion tomorrow.

## 2023-05-07 ENCOUNTER — Ambulatory Visit: Payer: Medicare Other

## 2023-05-08 ENCOUNTER — Other Ambulatory Visit: Payer: Self-pay

## 2023-05-08 ENCOUNTER — Encounter: Payer: Self-pay | Admitting: Oncology

## 2023-05-08 DIAGNOSIS — C9200 Acute myeloblastic leukemia, not having achieved remission: Secondary | ICD-10-CM

## 2023-05-08 MED ORDER — PROCHLORPERAZINE MALEATE 10 MG PO TABS: 10.0000 mg | ORAL_TABLET | Freq: Four times a day (QID) | ORAL | 1 refills | Status: DC | PRN

## 2023-05-08 MED ORDER — ONDANSETRON HCL 8 MG PO TABS: 8.0000 mg | ORAL_TABLET | Freq: Three times a day (TID) | ORAL | 1 refills | Status: DC | PRN

## 2023-05-08 MED ORDER — ALLOPURINOL 300 MG PO TABS: 300.0000 mg | ORAL_TABLET | Freq: Every day | ORAL | 2 refills | Status: DC

## 2023-05-09 ENCOUNTER — Other Ambulatory Visit: Payer: Self-pay

## 2023-05-09 NOTE — Progress Notes (Signed)
Pharmacist Chemotherapy Monitoring - Initial Assessment    Anticipated start date: 05/12/23   The following has been reviewed per standard work regarding the patient's treatment regimen: The patient's diagnosis, treatment plan and drug doses, and organ/hematologic function Lab orders and baseline tests specific to treatment regimen  The treatment plan start date, drug sequencing, and pre-medications Prior authorization status  Patient's documented medication list, including drug-drug interaction screen and prescriptions for anti-emetics and supportive care specific to the treatment regimen The drug concentrations, fluid compatibility, administration routes, and timing of the medications to be used The patient's access for treatment and lifetime cumulative dose history, if applicable  The patient's medication allergies and previous infusion related reactions, if applicable   Changes made to treatment plan:  N/A  Follow up needed:  N/A   Sharen Hones, PharmD, BCPS Clinical Pharmacist   05/09/2023  12:47 PM

## 2023-05-11 ENCOUNTER — Encounter: Payer: Self-pay | Admitting: Oncology

## 2023-05-11 NOTE — Progress Notes (Signed)
Hematology/Oncology Consult note [facility] Telephone:([phone] [phone]  Patient Care Team: Corky Downs, MD as PCP - General (Internal Medicine)   Name of the patient: [name]  [id number]  1955/11/08    Reason for referral-transfer of care after new diagnosis of acute myeloid leukemia from [name]   Referring physician-Dr. Audrie Gallus  Date of visit: 05/11/23   History of presenting illness- Patient is a 68 year old female with a past medical history significant for GERD with no other major comorbidities who was seen at [facility] with symptoms of nausea vomiting and diarrhea.  Labs showed symptomatic anemia significant thrombocytopenia and a white cell count of 33.  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  64% blasts on manual aspirate differential.  Karyotype was normal.  SF 3 B1, TET 2, R UN X1, P HF 6 and FLT3 mutations were detected.    She was seen by Dr. Bradly Bienenstock from [facility] and patient did not desire intensive induction chemotherapy and consideration for transplant.  He gave her various treatment options including metronomic therapy with low-dose weekly decitabine plus venetoclax versus azacitidine plus venetoclax versus single agent hypomethylating agent such as azacitidine or decitabine alone.  However patient declined treatment and she was concerned about travel to [facility] for treatment.  So far she is only on Hydrea for cytoreduction and has been getting periodic CBCs checked for blood and/or platelet transfusions.  She is a widow and lives by herself and has considerable anxiety about starting treatment for AML which could involve potential side effects.  She denies any bleeding bruising or infections at this time.  She reports ongoing fatigue.  ECOG PS- 1  Pain scale- 0   Review of systems- Review of Systems  Constitutional:  Positive for  malaise/fatigue. Negative for chills, fever and weight loss.  HENT:  Negative for congestion, ear discharge and nosebleeds.   Eyes:  Negative for blurred vision.  Respiratory:  Negative for cough, hemoptysis, sputum production, shortness of breath and wheezing.   Cardiovascular:  Negative for chest pain, palpitations, orthopnea and claudication.  Gastrointestinal:  Negative for abdominal pain, blood in stool, constipation, diarrhea, heartburn, melena, nausea and vomiting.  Genitourinary:  Negative for dysuria, flank pain, frequency, hematuria and urgency.  Musculoskeletal:  Negative for back pain, joint pain and myalgias.  Skin:  Negative for rash.  Neurological:  Negative for dizziness, tingling, focal weakness, seizures, weakness and headaches.  Endo/Heme/Allergies:  Does not bruise/bleed easily.  Psychiatric/Behavioral:  Negative for depression and suicidal ideas. The patient does not have insomnia.     Allergies  Allergen Reactions   Prednisone Anaphylaxis    Pt states it turned her into a monster, lips started swelling and couldn't breath   Amoxicillin    Codeine    Heparin     Patient Active Problem List   Diagnosis Date Noted   Acute myeloid leukemia not having achieved remission (HCC) 03/10/2023   Anemia 03/04/2023   Aortic atherosclerosis (HCC) 03/04/2023     Past Medical History:  Diagnosis Date   GERD (gastroesophageal reflux disease)      Past Surgical History:  Procedure Laterality Date   CARDIAC SURGERY     CHOLECYSTECTOMY     HIP ARTHROSCOPY     surgery on left foot Left     Social History   Socioeconomic History   Marital status: Widowed    Spouse name: Not on  file   Number of children: Not on file   Years of education: Not on file   Highest education level: Not on file  Occupational History   Not on file  Tobacco Use   Smoking status: Every Day    Current packs/day: 0.25    Average packs/day: 0.3 packs/day for 4.0 years (1.0 ttl pk-yrs)     Types: Cigarettes   Smokeless tobacco: Never  Vaping Use   Vaping status: Never Used  Substance and Sexual Activity   Alcohol use: Not Currently   Drug use: Not Currently   Sexual activity: Not Currently  Other Topics Concern   Not on file  Social History Narrative   Not on file   Social Drivers of Health   Financial Resource Strain: Low Risk  (03/05/2023)   Received from [facility]   Overall Financial Resource Strain (CARDIA)    Difficulty of Paying Living Expenses: Not very hard  Food Insecurity: No Food Insecurity (05/06/2023)   Hunger Vital Sign    Worried About Running Out of Food in the Last Year: Never true    Ran Out of Food in the Last Year: Never true  Transportation Needs: No Transportation Needs (05/06/2023)   PRAPARE - Administrator, Civil Service (Medical): No    Lack of Transportation (Non-Medical): No  Physical Activity: Insufficiently Active (03/22/2021)   Exercise Vital Sign    Days of Exercise per Week: 2 days    Minutes of Exercise per Session: 10 min  Stress: No Stress Concern Present (03/22/2021)   [employer] of Occupational Health - Occupational Stress Questionnaire    Feeling of Stress : Not at all  Social Connections: Socially Isolated (03/22/2021)   Social Connection and Isolation Panel [NHANES]    Frequency of Communication with Friends and Family: More than three times a week    Frequency of Social Gatherings with Friends and Family: Not on file    Attends Religious Services: Never    Active Member of Clubs or Organizations: No    Attends Banker Meetings: Never    Marital Status: Widowed  Intimate Partner Violence: Not At Risk (05/06/2023)   Humiliation, Afraid, Rape, and Kick questionnaire    Fear of Current or Ex-Partner: No    Emotionally Abused: No    Physically Abused: No    Sexually Abused: No     Family History  Problem Relation Age of Onset   Cancer Mother    Heart failure Father    Heart  failure Sister    Cancer Maternal Grandmother    Cancer Maternal Grandfather      Current Outpatient Medications:    ALPRAZolam (XANAX) 0.5 MG tablet, Take 0.5 mg by mouth at bedtime as needed for sleep., Disp: , Rfl:    ascorbic acid (VITAMIN C) 500 MG tablet, Take 500 mg by mouth daily., Disp: , Rfl:    b complex vitamins capsule, Take 1 capsule by mouth daily., Disp: , Rfl:    cholecalciferol (VITAMIN D3) 10 MCG (400 UNIT) TABS tablet, Take 400 Units by mouth., Disp: , Rfl:    hydroxyurea (HYDREA) 500 MG capsule, Take 1,000 mg by mouth 2 (two) times daily. May take with food to minimize GI side effects., Disp: , Rfl:    pantoprazole (PROTONIX) 20 MG tablet, Take 20 mg by mouth daily., Disp: , Rfl:    polyethylene glycol (MIRALAX / GLYCOLAX) 17 g packet, Take 17 g by mouth daily as needed for  mild constipation., Disp: , Rfl:    allopurinol (ZYLOPRIM) 300 MG tablet, Take 1 tablet (300 mg total) by mouth daily., Disp: 30 tablet, Rfl: 2   enalapril (VASOTEC) 20 MG tablet, TAKE 1 TABLET BY MOUTH ONCE A DAY (Patient not taking: Reported on 05/06/2023), Disp: 30 tablet, Rfl: 6   ibuprofen (ADVIL,MOTRIN) 600 MG tablet, Take 1 tablet (600 mg total) by mouth every 6 (six) hours as needed. (Patient not taking: Reported on 03/04/2023), Disp: 30 tablet, Rfl: 0   omeprazole (PRILOSEC) 20 MG capsule, TAKE 1 CAPSULE BY MOUTH ONCE DAILY (Patient not taking: Reported on 05/06/2023), Disp: 90 capsule, Rfl: 3   ondansetron (ZOFRAN) 8 MG tablet, Take 1 tablet (8 mg total) by mouth every 8 (eight) hours as needed for nausea or vomiting., Disp: 30 tablet, Rfl: 1   prochlorperazine (COMPAZINE) 10 MG tablet, Take 1 tablet (10 mg total) by mouth every 6 (six) hours as needed for nausea or vomiting., Disp: 30 tablet, Rfl: 1   Physical exam:  Vitals:   05/06/23 1323  BP: 105/76  Pulse: (!) 103  Resp: 20  Temp: 98.1 F (36.7 C)  TempSrc: Tympanic  SpO2: 98%  Weight: 153 lb 6.4 oz (69.6 kg)   Physical  Exam Cardiovascular:     Rate and Rhythm: Normal rate and regular rhythm.     Heart sounds: Normal heart sounds.  Pulmonary:     Effort: Pulmonary effort is normal.     Breath sounds: Normal breath sounds.  Abdominal:     General: Bowel sounds are normal.     Palpations: Abdomen is soft.     Comments: No palpable hepatosplenomegaly  Skin:    General: Skin is warm and dry.  Neurological:     Mental Status: She is alert and oriented to person, place, and time.           Latest Ref Rng & Units 03/03/2023    7:47 PM  CMP  Glucose 70 - 99 mg/dL 130   BUN 8 - 23 mg/dL 13   Creatinine 8.65 - 1.00 mg/dL 7.84   Sodium 696 - 295 mmol/L 131   Potassium 3.5 - 5.1 mmol/L 3.6   Chloride 98 - 111 mmol/L 98   CO2 22 - 32 mmol/L 27   Calcium 8.9 - 10.3 mg/dL 8.5   Total Protein 6.5 - 8.1 g/dL 6.5   Total Bilirubin <2.8 mg/dL 0.7   Alkaline Phos 38 - 126 U/L 69   AST 15 - 41 U/L 15   ALT 0 - 44 U/L 9       Latest Ref Rng & Units 05/06/2023    2:54 PM  CBC  WBC 4.0 - 10.5 K/uL 13.2   Hemoglobin 12.0 - 15.0 g/dL 9.2   Hematocrit 41.3 - 46.0 % 27.4   Platelets 150 - 400 K/uL 14    Assessment and plan- Patient is a 68 y.o. female with newly diagnosed AML adverse risk profile FLT 3+, currently on hydrea is here to discuss further management of AML  As per my conversation with the patient she clearly does not desire any induction chemotherapy or consideration for bone marrow transplant in the future.  For nontransplant candidates potential treatment options include either single agent hypomethylating agent such as azacitidine which is typically given 75 mg/m for 7 days each month subcutaneously until progression or toxicity or Decitabine given at 20 mg/m IV for 5 days every month until progression or toxicity.  These hypomethylating agents can  be combined with venetoclax as well for deeper responses.  Discussed risks and benefits of hypomethylating agents including all but not limited to  nausea vomiting diarrhea low blood counts risk of infections and hospitalizations.  Risk of infection will go up when venetoclax is added to the regimen.azacytidine and decitabine have not been compared head to head.   Metronomic treatment using weekly Decitabine subcutaneously at 0.2 mg along with venetoclax 400 mg given 1 day before decitabine was also discussed by Dr. Bradly Bienenstock with the patient. In a retrospective, single-center study of 63 patients (Levitz et al., Clin Cancer Res, 2023), this approach demonstrated robust activity, with an 88% overall response rate in newly diagnosed AML and 64% in MDS. Notably, patients with TP53 mutations achieved a composite complete response rate of 71% and a median overall survival of 10.7 months. Compared to standard HMA/VEN regimens, patients on LDDec/VEN remained on therapy longer (median 175 vs. 78 days; P=0.014) and showed a trend toward higher transfusion independence (47% vs. 26%). Additionally, the once-weekly dosing was associated with less severe myelosuppression, with neutropenic fever occurring in 31% of patients and a median of one hospitalization during treatment.   Plan is to stop hydrea when she starts one of the treatment options above. Hydrea alone only serves as cytoreductive therapy that may work for a short while for days but subsequently there is a high risk of AML to go into blast crisis which is uniformly fatal. Supportive transfusions alone carry poor prognosis likely in weeks and may not sustain her counts in the safe zone for more than a few days.  I will discuss her case with DR. Bradly Bienenstock and call the patient in 1-2 days to see what she decides. She will check her cbc weekly for possible transfusion each week. I also feel that her care should be co managed between me and Dr Bradly Bienenstock instead of [facility] alone.   Thank you for this kind referral and the opportunity to participate in the care of this patient   Visit Diagnosis 1. Acute myeloid  leukemia not having achieved remission (HCC)   2. Goals of care, counseling/discussion     Dr. Owens Shark, MD, MPH [facility] at [facility] [phone] 05/11/2023

## 2023-05-12 ENCOUNTER — Inpatient Hospital Stay (HOSPITAL_BASED_OUTPATIENT_CLINIC_OR_DEPARTMENT_OTHER): Payer: Medicare Other | Admitting: Oncology

## 2023-05-12 ENCOUNTER — Encounter: Payer: Self-pay | Admitting: Oncology

## 2023-05-12 ENCOUNTER — Other Ambulatory Visit: Payer: Self-pay | Admitting: Oncology

## 2023-05-12 ENCOUNTER — Inpatient Hospital Stay: Payer: Medicare Other

## 2023-05-12 ENCOUNTER — Other Ambulatory Visit: Payer: Self-pay

## 2023-05-12 ENCOUNTER — Inpatient Hospital Stay: Payer: Medicare Other | Attending: Oncology

## 2023-05-12 VITALS — BP 108/59 | HR 90 | Resp 18

## 2023-05-12 VITALS — BP 80/71 | HR 99 | Temp 98.8°F | Resp 18 | Wt 159.9 lb

## 2023-05-12 DIAGNOSIS — Z79624 Long term (current) use of inhibitors of nucleotide synthesis: Secondary | ICD-10-CM | POA: Insufficient documentation

## 2023-05-12 DIAGNOSIS — D696 Thrombocytopenia, unspecified: Secondary | ICD-10-CM | POA: Diagnosis not present

## 2023-05-12 DIAGNOSIS — C9200 Acute myeloblastic leukemia, not having achieved remission: Secondary | ICD-10-CM

## 2023-05-12 DIAGNOSIS — Z5111 Encounter for antineoplastic chemotherapy: Secondary | ICD-10-CM

## 2023-05-12 DIAGNOSIS — F17210 Nicotine dependence, cigarettes, uncomplicated: Secondary | ICD-10-CM | POA: Insufficient documentation

## 2023-05-12 DIAGNOSIS — D61818 Other pancytopenia: Secondary | ICD-10-CM | POA: Diagnosis not present

## 2023-05-12 DIAGNOSIS — Z79899 Other long term (current) drug therapy: Secondary | ICD-10-CM | POA: Diagnosis not present

## 2023-05-12 LAB — CBC WITH DIFFERENTIAL (CANCER CENTER ONLY)
Abs Immature Granulocytes: 0 10*3/uL (ref 0.00–0.07)
Basophils Absolute: 0 10*3/uL (ref 0.0–0.1)
Basophils Relative: 0 %
Blasts: 24 %
Eosinophils Absolute: 0 10*3/uL (ref 0.0–0.5)
Eosinophils Relative: 0 %
HCT: 22.2 % — ABNORMAL LOW (ref 36.0–46.0)
Hemoglobin: 7.5 g/dL — ABNORMAL LOW (ref 12.0–15.0)
Lymphocytes Relative: 51 %
Lymphs Abs: 4.3 10*3/uL — ABNORMAL HIGH (ref 0.7–4.0)
MCH: 30.4 pg (ref 26.0–34.0)
MCHC: 33.8 g/dL (ref 30.0–36.0)
MCV: 89.9 fL (ref 80.0–100.0)
Monocytes Absolute: 1.4 10*3/uL — ABNORMAL HIGH (ref 0.1–1.0)
Monocytes Relative: 17 %
Neutro Abs: 0.7 10*3/uL — ABNORMAL LOW (ref 1.7–7.7)
Neutrophils Relative %: 8 %
Platelet Count: 5 10*3/uL — CL (ref 150–400)
RBC: 2.47 MIL/uL — ABNORMAL LOW (ref 3.87–5.11)
RDW: 18.5 % — ABNORMAL HIGH (ref 11.5–15.5)
Smear Review: NORMAL
WBC Count: 8.4 10*3/uL (ref 4.0–10.5)
nRBC: 0.7 % — ABNORMAL HIGH (ref 0.0–0.2)

## 2023-05-12 LAB — TYPE AND SCREEN
ABO/RH(D): A POS
Antibody Screen: NEGATIVE

## 2023-05-12 LAB — CMP (CANCER CENTER ONLY)
ALT: 17 U/L (ref 0–44)
AST: 17 U/L (ref 15–41)
Albumin: 4 g/dL (ref 3.5–5.0)
Alkaline Phosphatase: 86 U/L (ref 38–126)
Anion gap: 4 — ABNORMAL LOW (ref 5–15)
BUN: 15 mg/dL (ref 8–23)
CO2: 24 mmol/L (ref 22–32)
Calcium: 8.5 mg/dL — ABNORMAL LOW (ref 8.9–10.3)
Chloride: 100 mmol/L (ref 98–111)
Creatinine: 0.83 mg/dL (ref 0.44–1.00)
GFR, Estimated: >60 mL/min (ref >60)
Glucose, Bld: 112 mg/dL — ABNORMAL HIGH (ref 70–99)
Potassium: 4.1 mmol/L (ref 3.5–5.1)
Sodium: 128 mmol/L — ABNORMAL LOW (ref 135–145)
Total Bilirubin: 0.6 mg/dL (ref 0.0–1.2)
Total Protein: 6.9 g/dL (ref 6.5–8.1)

## 2023-05-12 MED ORDER — SODIUM CHLORIDE 0.9 % IV SOLN
INTRAVENOUS | Status: DC
Start: 2023-05-12 — End: 2023-05-12
  Filled 2023-05-12 (×2): qty 250

## 2023-05-12 MED ORDER — VALACYCLOVIR HCL 500 MG PO TABS: 500.0000 mg | ORAL_TABLET | Freq: Two times a day (BID) | ORAL | 1 refills | Status: DC

## 2023-05-12 MED ORDER — SODIUM CHLORIDE 0.9 % IV SOLN
20.0000 mg/m2 | Freq: Once | INTRAVENOUS | Status: AC
  Administered 2023-05-12: 35 mg via INTRAVENOUS
  Filled 2023-05-12: qty 7

## 2023-05-12 MED ORDER — PROCHLORPERAZINE MALEATE 10 MG PO TABS
10.0000 mg | ORAL_TABLET | Freq: Once | ORAL | Status: AC
  Administered 2023-05-12: 10 mg via ORAL
  Filled 2023-05-12: qty 1

## 2023-05-12 NOTE — Progress Notes (Signed)
Hematology/Oncology Consult note [facility]  Telephone:([phone] [phone]  Patient Care Team: Corky Downs, MD as PCP - General (Internal Medicine)   Name of the patient: [name]  [id number]  05-May-1955   Date of visit: 05/12/23  Diagnosis-acute myeloid leukemia  Chief complaint/ Reason for visit-on treatment assessment prior to cycle 1 day 1 of decitabine  Heme/Onc history: Patient is a 68 year old female with a past medical history significant for GERD with no other major comorbidities who was seen at [facility] with symptoms of nausea vomiting and diarrhea.  Labs showed symptomatic anemia significant thrombocytopenia and a white cell count of 33.  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  64% blasts on manual aspirate differential.  Karyotype was normal.  SF 3 B1, TET 2, R UN X1, P HF 6 and FLT3 mutations were detected.     She was seen by Dr. Bradly Bienenstock from [facility] and patient did not desire intensive induction chemotherapy and consideration for transplant.  He gave her various treatment options including metronomic therapy with low-dose weekly decitabine plus venetoclax versus azacitidine plus venetoclax versus single agent hypomethylating agent such as azacitidine or decitabine alone.  However patient declined treatment and she was concerned about travel to [facility] for treatment.    She is presently agreeable to proceeding with Decitabin monotherapy  Interval history-patient has been having occasional nosebleeds over the last few days.  No recent falls.  Reports ongoing fatigue  ECOG PS- 1 Pain scale- 0   Review of systems- Review of Systems  Constitutional:  Positive for malaise/fatigue.  Endo/Heme/Allergies:  Bruises/bleeds easily.      Allergies  Allergen Reactions   Prednisone Anaphylaxis    Pt states it turned her into a monster, lips started  swelling and couldn't breath   Amoxicillin    Codeine    Heparin      Past Medical History:  Diagnosis Date   GERD (gastroesophageal reflux disease)      Past Surgical History:  Procedure Laterality Date   CARDIAC SURGERY     CHOLECYSTECTOMY     HIP ARTHROSCOPY     surgery on left foot Left     Social History   Socioeconomic History   Marital status: Widowed    Spouse name: Not on file   Number of children: Not on file   Years of education: Not on file   Highest education level: Not on file  Occupational History   Not on file  Tobacco Use   Smoking status: Every Day    Current packs/day: 0.25    Average packs/day: 0.3 packs/day for 4.0 years (1.0 ttl pk-yrs)    Types: Cigarettes   Smokeless tobacco: Never  Vaping Use   Vaping status: Never Used  Substance and Sexual Activity   Alcohol use: Not Currently   Drug use: Not Currently   Sexual activity: Not Currently  Other Topics Concern   Not on file  Social History Narrative   Not on file   Social Drivers of Health   Financial Resource Strain: Low Risk  (03/05/2023)   Received from [facility]   Overall Financial Resource Strain (CARDIA)    Difficulty of Paying Living Expenses: Not very hard  Food Insecurity: No Food Insecurity (05/06/2023)   Hunger Vital Sign    Worried About Running Out of Food in the Last Year: Never true  Ran Out of Food in the Last Year: Never true  Transportation Needs: No Transportation Needs (05/06/2023)   PRAPARE - Administrator, Civil Service (Medical): No    Lack of Transportation (Non-Medical): No  Physical Activity: Insufficiently Active (03/22/2021)   Exercise Vital Sign    Days of Exercise per Week: 2 days    Minutes of Exercise per Session: 10 min  Stress: No Stress Concern Present (03/22/2021)   [employer] of Occupational Health - Occupational Stress Questionnaire    Feeling of Stress : Not at all  Social Connections: Socially Isolated  (03/22/2021)   Social Connection and Isolation Panel [NHANES]    Frequency of Communication with Friends and Family: More than three times a week    Frequency of Social Gatherings with Friends and Family: Not on file    Attends Religious Services: Never    Active Member of Clubs or Organizations: No    Attends Banker Meetings: Never    Marital Status: Widowed  Intimate Partner Violence: Not At Risk (05/06/2023)   Humiliation, Afraid, Rape, and Kick questionnaire    Fear of Current or Ex-Partner: No    Emotionally Abused: No    Physically Abused: No    Sexually Abused: No    Family History  Problem Relation Age of Onset   Cancer Mother    Heart failure Father    Heart failure Sister    Cancer Maternal Grandmother    Cancer Maternal Grandfather      Current Outpatient Medications:    valACYclovir (VALTREX) 500 MG tablet, Take 1 tablet (500 mg total) by mouth 2 (two) times daily., Disp: 60 tablet, Rfl: 1   allopurinol (ZYLOPRIM) 300 MG tablet, Take 1 tablet (300 mg total) by mouth daily., Disp: 30 tablet, Rfl: 2   ALPRAZolam (XANAX) 0.5 MG tablet, Take 0.5 mg by mouth at bedtime as needed for sleep., Disp: , Rfl:    ascorbic acid (VITAMIN C) 500 MG tablet, Take 500 mg by mouth daily., Disp: , Rfl:    b complex vitamins capsule, Take 1 capsule by mouth daily., Disp: , Rfl:    cholecalciferol (VITAMIN D3) 10 MCG (400 UNIT) TABS tablet, Take 400 Units by mouth., Disp: , Rfl:    enalapril (VASOTEC) 20 MG tablet, TAKE 1 TABLET BY MOUTH ONCE A DAY (Patient not taking: Reported on 05/06/2023), Disp: 30 tablet, Rfl: 6   hydroxyurea (HYDREA) 500 MG capsule, Take 1,000 mg by mouth 2 (two) times daily. May take with food to minimize GI side effects., Disp: , Rfl:    ibuprofen (ADVIL,MOTRIN) 600 MG tablet, Take 1 tablet (600 mg total) by mouth every 6 (six) hours as needed. (Patient not taking: Reported on 03/04/2023), Disp: 30 tablet, Rfl: 0   omeprazole (PRILOSEC) 20 MG capsule,  TAKE 1 CAPSULE BY MOUTH ONCE DAILY (Patient not taking: Reported on 05/06/2023), Disp: 90 capsule, Rfl: 3   ondansetron (ZOFRAN) 8 MG tablet, Take 1 tablet (8 mg total) by mouth every 8 (eight) hours as needed for nausea or vomiting., Disp: 30 tablet, Rfl: 1   pantoprazole (PROTONIX) 20 MG tablet, Take 20 mg by mouth daily., Disp: , Rfl:    polyethylene glycol (MIRALAX / GLYCOLAX) 17 g packet, Take 17 g by mouth daily as needed for mild constipation., Disp: , Rfl:    prochlorperazine (COMPAZINE) 10 MG tablet, Take 1 tablet (10 mg total) by mouth every 6 (six) hours as needed for nausea or vomiting., Disp: 30 tablet,  Rfl: 1 No current facility-administered medications for this visit.  Facility-Administered Medications Ordered in Other Visits:    0.9 %  sodium chloride infusion, , Intravenous, Continuous, Creig Hines, MD, Stopped at 05/12/23 1633  Physical exam:  Vitals:   05/12/23 1338  BP: (!) 80/71  Pulse: 99  Resp: 18  Temp: 98.8 F (37.1 C)  TempSrc: Tympanic  SpO2: 99%  Weight: 159 lb 14.4 oz (72.5 kg)   Physical Exam Cardiovascular:     Rate and Rhythm: Normal rate and regular rhythm.     Heart sounds: Normal heart sounds.  Pulmonary:     Effort: Pulmonary effort is normal.     Breath sounds: Normal breath sounds.  Skin:    General: Skin is warm and dry.  Neurological:     Mental Status: She is alert and oriented to person, place, and time.         Latest Ref Rng & Units 05/12/2023    1:09 PM  CMP  Glucose 70 - 99 mg/dL 604   BUN 8 - 23 mg/dL 15   Creatinine 5.40 - 1.00 mg/dL 9.81   Sodium 191 - 478 mmol/L 128   Potassium 3.5 - 5.1 mmol/L 4.1   Chloride 98 - 111 mmol/L 100   CO2 22 - 32 mmol/L 24   Calcium 8.9 - 10.3 mg/dL 8.5   Total Protein 6.5 - 8.1 g/dL 6.9   Total Bilirubin 0.0 - 1.2 mg/dL 0.6   Alkaline Phos 38 - 126 U/L 86   AST 15 - 41 U/L 17   ALT 0 - 44 U/L 17       Latest Ref Rng & Units 05/12/2023    1:09 PM  CBC  WBC 4.0 - 10.5 K/uL 8.4    Hemoglobin 12.0 - 15.0 g/dL 7.5   Hematocrit 29.5 - 46.0 % 22.2   Platelets 150 - 400 K/uL 5      Assessment and plan- Patient is a 68 y.o. female with h/o acute AML here for on treatment assessment prior to cycle 1 day 1 of decitabine  Counts okay to proceed with cycle 1 day 1 of decitabine today which she will receive for 5 continuous days every month.  She has severe thrombocytopenia secondary to AML and therefore pancytopenia is not related to Decitabine.  I have recommended that she should start acyclovir prophylaxis and we will send prescription for Valtrex 500 mg twice daily to her pharmacy.  If her ANC drops to less than 0.5 I will also start her on Levaquin prophylaxis.  Discussed acetaminophens of density been again including all but not limited to possible risk of low blood counts risk of infections nausea vomiting and diarrhea.  Patient understands and agrees to proceed as planned.  Severe thrombocytopenia: Patient will receive 1 unit of platelets tomorrow.  Hemoglobin is 7.6 and I am holding off on blood transfusion this week but she may require blood transfusion next week  Patient will come for labs every week for possible transfusion.  She comprehends my plan well.  She will stop taking her Hydrea at this time   Visit Diagnosis 1. Acute myeloid leukemia not having achieved remission (HCC)   2. Encounter for antineoplastic chemotherapy   3. Severe thrombocytopenia (HCC)      Dr. Owens Shark, MD, MPH [facility] at [facility] [phone] 05/12/2023 7:41 PM

## 2023-05-12 NOTE — Patient Instructions (Addendum)
[facility] - A DEPT OF [facility]  Discharge Instructions: Thank you for choosing [facility] to provide your oncology and hematology care.  If you have a lab appointment with the Cancer Center, please go directly to the Cancer Center and check in at the registration area.  Wear comfortable clothing and clothing appropriate for easy access to any Portacath or PICC line.   We strive to give you quality time with your provider. You may need to reschedule your appointment if you arrive late (15 or more minutes).  Arriving late affects you and other patients whose appointments are after yours.  Also, if you miss three or more appointments without notifying the office, you may be dismissed from the clinic at the provider's discretion.      For prescription refill requests, have your pharmacy contact our office and allow 72 hours for refills to be completed.    Today you received the following chemotherapy and/or immunotherapy agents Dacogen      To help prevent nausea and vomiting after your treatment, we encourage you to take your nausea medication as directed.  BELOW ARE SYMPTOMS THAT SHOULD BE REPORTED IMMEDIATELY: *FEVER GREATER THAN 100.4 F (38 C) OR HIGHER *CHILLS OR SWEATING *NAUSEA AND VOMITING THAT IS NOT CONTROLLED WITH YOUR NAUSEA MEDICATION *UNUSUAL SHORTNESS OF BREATH *UNUSUAL BRUISING OR BLEEDING *URINARY PROBLEMS (pain or burning when urinating, or frequent urination) *BOWEL PROBLEMS (unusual diarrhea, constipation, pain near the anus) TENDERNESS IN MOUTH AND THROAT WITH OR WITHOUT PRESENCE OF ULCERS (sore throat, sores in mouth, or a toothache) UNUSUAL RASH, SWELLING OR PAIN  UNUSUAL VAGINAL DISCHARGE OR ITCHING   Items with * indicate a potential emergency and should be followed up as soon as possible or go to the Emergency Department if any problems should occur.  Please show the CHEMOTHERAPY ALERT CARD or IMMUNOTHERAPY  ALERT CARD at check-in to the Emergency Department and triage nurse.  Should you have questions after your visit or need to cancel or reschedule your appointment, please contact [facility] - A DEPT OF [facility]  [phone] and follow the prompts.  Office hours are 8:00 a.m. to 4:30 p.m. Monday - Friday. Please note that voicemails left after 4:00 p.m. may not be returned until the following business day.  We are closed weekends and major holidays. You have access to a nurse at all times for urgent questions. Please call the main number to the clinic [phone] and follow the prompts.  For any non-urgent questions, you may also contact your provider using MyChart. We now offer e-Visits for anyone 63 and older to request care online for non-urgent symptoms. For details visit [email or web address].   Also download the MyChart app! Go to the app store, search "MyChart", open the app, select , and log in with your MyChart username and password.   Decitabine Injection What is this medication? DECITABINE (dee SYE ta been) treats blood and bone marrow cancers. It works by slowing down the growth of cancer cells. This medicine may be used for other purposes; ask your health care provider or pharmacist if you have questions. COMMON BRAND NAME(S): Dacogen What should I tell my care team before I take this medication? They need to know if you have any of these conditions: Infection Kidney disease Liver disease An unusual or allergic reaction to decitabine, other medications, foods, dyes, or preservatives Pregnant or trying to get pregnant  Breast-feeding How should I use this medication? This medication is infused into a vein. It is given by your care team in a hospital or clinic setting. Talk to your care team about the use of this medication in children. Special care may be needed. Overdosage: If you think you have taken too much of this medicine  contact a poison control center or emergency room at once. NOTE: This medicine is only for you. Do not share this medicine with others. What if I miss a dose? Keep appointments for follow-up doses. It is important not to miss your dose. Call your care team if you are unable to keep an appointment. What may interact with this medication? Interactions are not expected. This list may not describe all possible interactions. Give your health care provider a list of all the medicines, herbs, non-prescription drugs, or dietary supplements you use. Also tell them if you smoke, drink alcohol, or use illegal drugs. Some items may interact with your medicine. What should I watch for while using this medication? Visit your care team for regular checks on your progress. You may need blood work while taking this medication. This medication may make you feel generally unwell. This is not uncommon as chemotherapy can affect healthy cells as well as cancer cells. Report any side effects. Continue your course of treatment even though you feel ill unless your care team tells you to stop. This medication may increase your risk of getting an infection. Call your care team for advice if you get a fever, chills, sore throat, or other symptoms of a cold or flu. Do not treat yourself. Try to avoid being around people who are sick. Be careful brushing or flossing your teeth or using a toothpick because you may get an infection or bleed more easily. If you have any dental work done, tell your dentist you are receiving this medication. Call your care team if you are around anyone with measles, chickenpox, or if you develop sores or blisters that do not heal properly. Avoid taking medications that contain aspirin, acetaminophen, ibuprofen, naproxen, or ketoprofen unless instructed by your care team. These medications may hide a fever. Talk to your care team if you or your partner wish to become pregnant or think either of you  might be pregnant. This medication can cause serious birth defects if taken during pregnancy or for 6 months after the last dose. A negative pregnancy test is required before starting this medication. A reliable form of contraception is recommended while taking this medication and for 6 months after the last dose. Talk to your care team about reliable forms of contraception. Do not father a child while taking this medication or for 3 months after the last dose. Use a condom while having sex during this time period. Do not breast-feed while taking this medication and for 2 weeks after the last dose. This medication may cause infertility. Talk to your care team if you are concerned about your fertility. What side effects may I notice from receiving this medication? Side effects that you should report to your care team as soon as possible: Allergic reactions--skin rash, itching, hives, swelling of the face, lips, tongue, or throat Infection--fever, chills, cough, sore throat, wounds that don't heal, pain or trouble when passing urine, general feeling of discomfort or being unwell Low red blood cell level--unusual weakness or fatigue, dizziness, headache, trouble breathing Unusual bruising or bleeding Side effects that usually do not require medical attention (report to your care team if  they continue or are bothersome): Constipation Diarrhea Fatigue Nausea Pain, redness, or swelling with sores inside the mouth or throat Stomach pain This list may not describe all possible side effects. Call your doctor for medical advice about side effects. You may report side effects to FDA at 1-800-FDA-1088. Where should I keep my medication? This medication is given in a hospital or clinic. It will not be stored at home. NOTE: This sheet is a summary. It may not cover all possible information. If you have questions about this medicine, talk to your doctor, pharmacist, or health care provider.  2024 [employer]vier/Gold  Standard (2021-07-04 00:00:00)

## 2023-05-13 ENCOUNTER — Other Ambulatory Visit: Payer: Self-pay

## 2023-05-13 ENCOUNTER — Inpatient Hospital Stay: Payer: Medicare Other

## 2023-05-13 VITALS — BP 111/47 | HR 90 | Temp 97.5°F | Resp 18

## 2023-05-13 DIAGNOSIS — C9200 Acute myeloblastic leukemia, not having achieved remission: Secondary | ICD-10-CM

## 2023-05-13 DIAGNOSIS — D696 Thrombocytopenia, unspecified: Secondary | ICD-10-CM

## 2023-05-13 DIAGNOSIS — Z5111 Encounter for antineoplastic chemotherapy: Secondary | ICD-10-CM | POA: Diagnosis not present

## 2023-05-13 MED ORDER — PROCHLORPERAZINE MALEATE 10 MG PO TABS
10.0000 mg | ORAL_TABLET | Freq: Once | ORAL | Status: AC
  Administered 2023-05-13: 10 mg via ORAL

## 2023-05-13 MED ORDER — SODIUM CHLORIDE 0.9% IV SOLUTION
250.0000 mL | INTRAVENOUS | Status: DC
  Filled 2023-05-13: qty 250

## 2023-05-13 MED ORDER — SODIUM CHLORIDE 0.9 % IV SOLN
INTRAVENOUS | Status: DC
Start: 2023-05-13 — End: 2023-05-13
  Filled 2023-05-13: qty 250

## 2023-05-13 MED ORDER — SODIUM CHLORIDE 0.9 % IV SOLN
20.0000 mg/m2 | Freq: Once | INTRAVENOUS | Status: AC
  Administered 2023-05-13: 35 mg via INTRAVENOUS
  Filled 2023-05-13: qty 7

## 2023-05-13 MED ORDER — ACETAMINOPHEN 325 MG PO TABS
650.0000 mg | ORAL_TABLET | Freq: Once | ORAL | Status: AC
  Administered 2023-05-13: 650 mg via ORAL
  Filled 2023-05-13: qty 2

## 2023-05-13 NOTE — Patient Instructions (Signed)

## 2023-05-14 ENCOUNTER — Inpatient Hospital Stay: Payer: Medicare Other

## 2023-05-14 VITALS — BP 123/49 | HR 82 | Temp 98.1°F | Resp 18

## 2023-05-14 DIAGNOSIS — C9200 Acute myeloblastic leukemia, not having achieved remission: Secondary | ICD-10-CM

## 2023-05-14 DIAGNOSIS — Z5111 Encounter for antineoplastic chemotherapy: Secondary | ICD-10-CM | POA: Diagnosis not present

## 2023-05-14 LAB — PREPARE PLATELET PHERESIS: Unit division: 0

## 2023-05-14 LAB — BPAM PLATELET PHERESIS
Blood Product Expiration Date: 202502062359
ISSUE DATE / TIME: 202502041208
Unit Type and Rh: 5100

## 2023-05-14 MED ORDER — SODIUM CHLORIDE 0.9 % IV SOLN
INTRAVENOUS | Status: DC
  Filled 2023-05-14: qty 250

## 2023-05-14 MED ORDER — PROCHLORPERAZINE MALEATE 10 MG PO TABS
10.0000 mg | ORAL_TABLET | Freq: Once | ORAL | Status: AC
  Administered 2023-05-14: 10 mg via ORAL
  Filled 2023-05-14: qty 1

## 2023-05-14 MED ORDER — SODIUM CHLORIDE 0.9 % IV SOLN
20.0000 mg/m2 | Freq: Once | INTRAVENOUS | Status: AC
  Administered 2023-05-14: 35 mg via INTRAVENOUS
  Filled 2023-05-14: qty 7

## 2023-05-14 NOTE — Patient Instructions (Signed)

## 2023-05-15 ENCOUNTER — Inpatient Hospital Stay: Payer: Medicare Other

## 2023-05-15 VITALS — BP 122/62 | HR 99 | Temp 99.4°F | Resp 16

## 2023-05-15 DIAGNOSIS — C9200 Acute myeloblastic leukemia, not having achieved remission: Secondary | ICD-10-CM

## 2023-05-15 DIAGNOSIS — Z5111 Encounter for antineoplastic chemotherapy: Secondary | ICD-10-CM | POA: Diagnosis not present

## 2023-05-15 MED ORDER — PROCHLORPERAZINE MALEATE 10 MG PO TABS
10.0000 mg | ORAL_TABLET | Freq: Once | ORAL | Status: AC
  Administered 2023-05-15: 10 mg via ORAL
  Filled 2023-05-15: qty 1

## 2023-05-15 MED ORDER — SODIUM CHLORIDE 0.9 % IV SOLN
INTRAVENOUS | Status: DC
  Filled 2023-05-15: qty 250

## 2023-05-15 MED ORDER — SODIUM CHLORIDE 0.9 % IV SOLN
20.0000 mg/m2 | Freq: Once | INTRAVENOUS | Status: AC
  Administered 2023-05-15: 35 mg via INTRAVENOUS
  Filled 2023-05-15: qty 7

## 2023-05-15 NOTE — Progress Notes (Signed)
 Patient for IR Port Insertion on Monday 05/19/2023, I called and spoke with the patient on the phone and gave pre-procedure instructions. Pt was made aware to be here at 12p, NPO after MN prior to procedure as well as driver post procedure/recovery/discharge. Pt stated understanding.  Called 05/15/2023

## 2023-05-15 NOTE — Patient Instructions (Signed)
 [facility] - A DEPT OF [facility]  Discharge Instructions: Thank you for choosing [facility] to provide your oncology and hematology care.  If you have a lab appointment with the Cancer Center, please go directly to the Cancer Center and check in at the registration area.  Wear comfortable clothing and clothing appropriate for easy access to any Portacath or PICC line.   We strive to give you quality time with your provider. You may need to reschedule your appointment if you arrive late (15 or more minutes).  Arriving late affects you and other patients whose appointments are after yours.  Also, if you miss three or more appointments without notifying the office, you may be dismissed from the clinic at the provider's discretion.      For prescription refill requests, have your pharmacy contact our office and allow 72 hours for refills to be completed.    Today you received the following chemotherapy and/or immunotherapy agents- dacitibine      To help prevent nausea and vomiting after your treatment, we encourage you to take your nausea medication as directed.  BELOW ARE SYMPTOMS THAT SHOULD BE REPORTED IMMEDIATELY: *FEVER GREATER THAN 100.4 F (38 C) OR HIGHER *CHILLS OR SWEATING *NAUSEA AND VOMITING THAT IS NOT CONTROLLED WITH YOUR NAUSEA MEDICATION *UNUSUAL SHORTNESS OF BREATH *UNUSUAL BRUISING OR BLEEDING *URINARY PROBLEMS (pain or burning when urinating, or frequent urination) *BOWEL PROBLEMS (unusual diarrhea, constipation, pain near the anus) TENDERNESS IN MOUTH AND THROAT WITH OR WITHOUT PRESENCE OF ULCERS (sore throat, sores in mouth, or a toothache) UNUSUAL RASH, SWELLING OR PAIN  UNUSUAL VAGINAL DISCHARGE OR ITCHING   Items with * indicate a potential emergency and should be followed up as soon as possible or go to the Emergency Department if any problems should occur.  Please show the CHEMOTHERAPY ALERT CARD or IMMUNOTHERAPY  ALERT CARD at check-in to the Emergency Department and triage nurse.  Should you have questions after your visit or need to cancel or reschedule your appointment, please contact [facility] - A DEPT OF [facility]  [phone] and follow the prompts.  Office hours are 8:00 a.m. to 4:30 p.m. Monday - Friday. Please note that voicemails left after 4:00 p.m. may not be returned until the following business day.  We are closed weekends and major holidays. You have access to a nurse at all times for urgent questions. Please call the main number to the clinic [phone] and follow the prompts.  For any non-urgent questions, you may also contact your provider using MyChart. We now offer e-Visits for anyone 63 and older to request care online for non-urgent symptoms. For details visit [email or web address].   Also download the MyChart app! Go to the app store, search MyChart, open the app, select [place], and log in with your MyChart username and password.

## 2023-05-16 ENCOUNTER — Other Ambulatory Visit: Payer: Self-pay | Admitting: Radiology

## 2023-05-16 ENCOUNTER — Inpatient Hospital Stay: Payer: Medicare Other

## 2023-05-16 VITALS — BP 103/50 | HR 85 | Temp 98.7°F | Resp 18

## 2023-05-16 DIAGNOSIS — C9200 Acute myeloblastic leukemia, not having achieved remission: Secondary | ICD-10-CM

## 2023-05-16 DIAGNOSIS — Z5111 Encounter for antineoplastic chemotherapy: Secondary | ICD-10-CM | POA: Diagnosis not present

## 2023-05-16 MED ORDER — SODIUM CHLORIDE 0.9 % IV SOLN
INTRAVENOUS | Status: DC
  Filled 2023-05-16: qty 250

## 2023-05-16 MED ORDER — SODIUM CHLORIDE 0.9 % IV SOLN
20.0000 mg/m2 | Freq: Once | INTRAVENOUS | Status: AC
  Administered 2023-05-16: 35 mg via INTRAVENOUS
  Filled 2023-05-16: qty 7

## 2023-05-16 MED ORDER — PROCHLORPERAZINE MALEATE 10 MG PO TABS
10.0000 mg | ORAL_TABLET | Freq: Once | ORAL | Status: AC
  Administered 2023-05-16: 10 mg via ORAL
  Filled 2023-05-16: qty 1

## 2023-05-16 NOTE — Patient Instructions (Signed)
 [facility] - A DEPT OF [facility]  Discharge Instructions: Thank you for choosing [facility] to provide your oncology and hematology care.  If you have a lab appointment with the Cancer Center, please go directly to the Cancer Center and check in at the registration area.  Wear comfortable clothing and clothing appropriate for easy access to any Portacath or PICC line.   We strive to give you quality time with your provider. You may need to reschedule your appointment if you arrive late (15 or more minutes).  Arriving late affects you and other patients whose appointments are after yours.  Also, if you miss three or more appointments without notifying the office, you may be dismissed from the clinic at the provider's discretion.      For prescription refill requests, have your pharmacy contact our office and allow 72 hours for refills to be completed.    Today you received the following chemotherapy and/or immunotherapy agents- decitibine      To help prevent nausea and vomiting after your treatment, we encourage you to take your nausea medication as directed.  BELOW ARE SYMPTOMS THAT SHOULD BE REPORTED IMMEDIATELY: *FEVER GREATER THAN 100.4 F (38 C) OR HIGHER *CHILLS OR SWEATING *NAUSEA AND VOMITING THAT IS NOT CONTROLLED WITH YOUR NAUSEA MEDICATION *UNUSUAL SHORTNESS OF BREATH *UNUSUAL BRUISING OR BLEEDING *URINARY PROBLEMS (pain or burning when urinating, or frequent urination) *BOWEL PROBLEMS (unusual diarrhea, constipation, pain near the anus) TENDERNESS IN MOUTH AND THROAT WITH OR WITHOUT PRESENCE OF ULCERS (sore throat, sores in mouth, or a toothache) UNUSUAL RASH, SWELLING OR PAIN  UNUSUAL VAGINAL DISCHARGE OR ITCHING   Items with * indicate a potential emergency and should be followed up as soon as possible or go to the Emergency Department if any problems should occur.  Please show the CHEMOTHERAPY ALERT CARD or IMMUNOTHERAPY  ALERT CARD at check-in to the Emergency Department and triage nurse.  Should you have questions after your visit or need to cancel or reschedule your appointment, please contact [facility] - A DEPT OF [facility]  [phone] and follow the prompts.  Office hours are 8:00 a.m. to 4:30 p.m. Monday - Friday. Please note that voicemails left after 4:00 p.m. may not be returned until the following business day.  We are closed weekends and major holidays. You have access to a nurse at all times for urgent questions. Please call the main number to the clinic [phone] and follow the prompts.  For any non-urgent questions, you may also contact your provider using MyChart. We now offer e-Visits for anyone 33 and older to request care online for non-urgent symptoms. For details visit [email or web address].   Also download the MyChart app! Go to the app store, search MyChart, open the app, select [place], and log in with your MyChart username and password.

## 2023-05-16 NOTE — H&P (Signed)
 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 female with a history of GERD who initially presented to ED at [facility] with c/o 3 weeks of nausea and abdominal discomfort. ED workup significant for WBC count of 42 and platelets of 17. Patient was transferred to [facility] for further workup. 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 64% blasts on manual aspirate differential. She was seen by Dr. Sterling Eisenmenger with [facility], but declined treatment as she was concerned about travelling to [facility] for treatment. She has been on Hydrea and getting periodic CBCs. Now agreeable for Decitabin monotherapy and referred to IR for port placement.   Pleasant female currently resting in bed with family at bedside. States that her only concern is becoming nauseous from the anesthesia medications as this has happened to her before. Discussed this with nurse who has already spoken with IR team. She has no other complaints at this time. VSS NPO since 7:00am this morning. WBC 11.5. Afebrile.   Code Status: Full code   Past Medical History:  Diagnosis Date   GERD (gastroesophageal reflux disease)    Leukemia (HCC)     Past Surgical History:  Procedure Laterality Date   CARDIAC SURGERY     CHOLECYSTECTOMY     HIP ARTHROSCOPY     surgery on left foot Left     Allergies: Prednisone, Amoxicillin, Codeine, and Heparin   Medications: Prior to Admission medications   Medication Sig Start Date End Date Taking? Authorizing Provider  allopurinol  (ZYLOPRIM ) 300 MG tablet Take 1 tablet (300 mg total) by mouth daily. 05/08/23   Avonne Boettcher, MD  ALPRAZolam  (XANAX ) 0.5 MG tablet Take 0.5 mg by mouth at bedtime as needed for  sleep.    [provider]  ascorbic acid  (VITAMIN C ) 500 MG tablet Take 500 mg by mouth daily.    [provider]  b complex vitamins capsule Take 1 capsule by mouth daily.    [provider]  cholecalciferol  (VITAMIN D3) 10 MCG (400 UNIT) TABS tablet Take 400 Units by mouth.    [provider]  enalapril (VASOTEC) 20 MG tablet TAKE 1 TABLET BY MOUTH ONCE A DAY Patient not taking: Reported on 05/06/2023 05/24/21   Theron Flavin, MD  hydroxyurea (HYDREA) 500 MG capsule Take 1,000 mg by mouth 2 (two) times daily. May take with food to minimize GI side effects.    [provider]  ibuprofen  (ADVIL ,MOTRIN ) 600 MG tablet Take 1 tablet (600 mg total) by mouth every 6 (six) hours as needed. Patient not taking: Reported on 03/04/2023 01/15/18   Lind Repine, MD  omeprazole  (PRILOSEC) 20 MG capsule TAKE 1 CAPSULE BY MOUTH ONCE DAILY Patient not taking: Reported on 05/06/2023 01/01/22   Theron Flavin, MD  ondansetron  (ZOFRAN ) 8 MG tablet Take 1 tablet (8 mg total) by mouth every 8 (eight) hours as needed for nausea or vomiting. 05/08/23   Avonne Boettcher, MD  pantoprazole  (PROTONIX ) 20 MG tablet Take 20 mg by mouth daily.    [provider]  polyethylene glycol (MIRALAX  / GLYCOLAX ) 17 g packet Take 17 g by mouth daily as needed for mild constipation.    [provider]  prochlorperazine  (COMPAZINE ) 10 MG tablet Take 1 tablet (10 mg  total) by mouth every 6 (six) hours as needed for nausea or vomiting. 05/08/23   Avonne Boettcher, MD  valACYclovir  (VALTREX ) 500 MG tablet Take 1 tablet (500 mg total) by mouth 2 (two) times daily. 05/12/23   Avonne Boettcher, MD     Family History  Problem Relation Age of Onset   Cancer Mother    Heart failure Father    Heart failure Sister    Cancer Maternal Grandmother    Cancer Maternal Grandfather     Social History   Socioeconomic History   Marital status: Widowed    Spouse name: Not on file   Number of  children: Not on file   Years of education: Not on file   Highest education level: Not on file  Occupational History   Not on file  Tobacco Use   Smoking status: Every Day    Current packs/day: 0.25    Average packs/day: 0.3 packs/day for 4.0 years (1.0 ttl pk-yrs)    Types: Cigarettes   Smokeless tobacco: Never  Vaping Use   Vaping status: Never Used  Substance and Sexual Activity   Alcohol use: Not Currently   Drug use: Not Currently   Sexual activity: Not Currently  Other Topics Concern   Not on file  Social History Narrative   Not on file   Social Drivers of Health   Financial Resource Strain: Low Risk  (03/05/2023)   Received from [facility]   Overall Financial Resource Strain (CARDIA)    Difficulty of Paying Living Expenses: Not very hard  Food Insecurity: No Food Insecurity (05/06/2023)   Hunger Vital Sign    Worried About Running Out of Food in the Last Year: Never true    Ran Out of Food in the Last Year: Never true  Transportation Needs: No Transportation Needs (05/06/2023)   PRAPARE - Administrator, Civil Service (Medical): No    Lack of Transportation (Non-Medical): No  Physical Activity: Insufficiently Active (03/22/2021)   Exercise Vital Sign    Days of Exercise per Week: 2 days    Minutes of Exercise per Session: 10 min  Stress: No Stress Concern Present (03/22/2021)   [employer] of Occupational Health - Occupational Stress Questionnaire    Feeling of Stress : Not at all  Social Connections: Socially Isolated (03/22/2021)   Social Connection and Isolation Panel [NHANES]    Frequency of Communication with Friends and Family: More than three times a week    Frequency of Social Gatherings with Friends and Family: Not on file    Attends Religious Services: Never    Active Member of Clubs or Organizations: No    Attends Banker Meetings: Never    Marital Status: Widowed    Review of Systems Denies any N/V, chest pain,  shortness of breath, fevers/chills. All other ROS negative.  Vital Signs: BP (!) 122/57   Pulse 89   Temp 98 F (36.7 C) (Oral)   Resp 17   SpO2 96%    Physical Exam Vitals reviewed.  Constitutional:      Appearance: Normal appearance.  HENT:     Head: Normocephalic and atraumatic.     Mouth/Throat:     Mouth: Mucous membranes are moist.     Pharynx: Oropharynx is clear.  Cardiovascular:     Rate and Rhythm: Normal rate and regular rhythm.     Heart sounds: Normal heart sounds.  Pulmonary:     Effort: Pulmonary effort is  normal.     Breath sounds: Normal breath sounds.  Abdominal:     General: Abdomen is flat.     Palpations: Abdomen is soft.     Tenderness: There is no abdominal tenderness.  Musculoskeletal:        General: Normal range of motion.     Cervical back: Normal range of motion.  Skin:    General: Skin is warm and dry.  Neurological:     General: No focal deficit present.     Mental Status: She is alert and oriented to person, place, and time. Mental status is at baseline.  Psychiatric:        Mood and Affect: Mood normal.        Behavior: Behavior normal.        Judgment: Judgment normal.     Imaging: CT ABDOMEN PELVIS W CONTRAST Result Date: 05/17/2023 CLINICAL DATA:  Left flank pain for 1 day. Status post chemotherapy yesterday for leukemia. EXAM: CT ABDOMEN AND PELVIS WITH CONTRAST TECHNIQUE: Multidetector CT imaging of the abdomen and pelvis was performed using the standard protocol following bolus administration of intravenous contrast. RADIATION DOSE REDUCTION: This exam was performed according to the departmental dose-optimization program which includes automated exposure control, adjustment of the mA and/or kV according to patient size and/or use of iterative reconstruction technique. CONTRAST:  OMNIPAQUE  IOHEXOL  300 MG/ML  SOLN COMPARISON:  03/03/2023. FINDINGS: Lower chest: Interval small left pleural effusion. Subsegmental  atelectasis/scarring in the lingula. Mild compressive atelectasis in the left lower lobe. Several interval small nodular densities in the right lower lobe measuring up to 3 mm in maximum diameter on image number 12/4. Borderline enlarged heart. Hepatobiliary: No focal liver abnormality is seen. Status post cholecystectomy. No biliary dilatation. Pancreas: Unremarkable. No pancreatic ductal dilatation or surrounding inflammatory changes. Spleen: Stable enlarged spleen. Adrenals/Urinary Tract: Adrenal glands are unremarkable. Kidneys are normal, without renal calculi, focal lesion, or hydronephrosis. Bladder is unremarkable. Stomach/Bowel: Stable moderately large distal duodenal diverticulum with an air-fluid level. Small to moderate-sized hiatal hernia. Mild colonic diverticulosis without evidence of diverticulitis. Normal-appearing appendix. Vascular/Lymphatic: Atheromatous arterial calcifications without aneurysm. Multiple mildly enlarged mesenteric lymph nodes with a mild interval increase in size of some of the nodes. These include right upper pelvic node with a short axis diameter of 9 mm on image number 46/7, previously 7 mm. Reproductive: Calcified uterine fibroids. Stable calcified and noncalcified uterine fibroids. No adnexal masses. Other: No abdominal wall hernia or abnormality. No abdominopelvic ascites. Musculoskeletal: Lumbar and lower thoracic spine degenerative changes.1 IMPRESSION: 1. No urinary tract calculi or hydronephrosis. 2. Interval small left pleural effusion with mild compressive atelectasis in the left lower lobe. 3. Several interval small nodular densities in the right lower lobe measuring up to 3 mm in maximum diameter. These are nonspecific. Attention to these on follow-up is recommended. 4. Stable splenomegaly. 5. Multiple mildly enlarged mesenteric lymph nodes with a mild interval increase in size of some of the nodes. These are most likely related to the patient's leukemia. 6. Small  to moderate-sized hiatal hernia. 7. Mild colonic diverticulosis without evidence of diverticulitis. 8. Stable calcified and noncalcified uterine fibroids. Electronically Signed   By: Catherin Closs M.D.   On: 05/17/2023 13:31    Labs:  CBC: Recent Labs    05/06/23 1454 05/12/23 1309 05/17/23 1208 05/19/23 1126  WBC 13.2* 8.4 12.6* 11.5*  HGB 9.2* 7.5* 6.0* 7.2*  HCT 27.4* 22.2* 17.9* 21.4*  PLT 14* 5* 9* 10*  COAGS: No results for input(s): "INR", "APTT" in the last 8760 hours.  BMP: Recent Labs    03/03/23 1947 05/12/23 1309 05/17/23 1208  NA 131* 128* 134*  K 3.6 4.1 4.1  CL 98 100 102  CO2 27 24 25   GLUCOSE 106* 112* 98  BUN 13 15 16   CALCIUM 8.5* 8.5* 8.4*  CREATININE 0.77 0.83 0.79  GFRNONAA >60 >60 >60    LIVER FUNCTION TESTS: Recent Labs    03/03/23 1947 05/12/23 1309 05/17/23 1208  BILITOT 0.7 0.6 0.4  AST 15 17 17   ALT 9 17 13   ALKPHOS 69 86 70  PROT 6.5 6.9 6.5  ALBUMIN 3.9 4.0 3.7    TUMOR MARKERS: No results for input(s): "AFPTM", "CEA", "CA199", "CHROMGRNA" in the last 8760 hours.  Assessment and Plan:  68 y.o. female with a history of GERD who initially presented to ED at [facility] with c/o 3 weeks of nausea and abdominal discomfort. ED workup significant for WBC count of 42 and platelets of 17. Patient was transferred to [facility] for further workup. 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 64% blasts on manual aspirate differential. She was seen by Dr. Sterling Eisenmenger with [facility], but declined treatment as she was concerned about travelling to [facility] for treatment. She has been on Hydrea and getting periodic CBCs. Now agreeable for Decitabin monotherapy and referred to IR for port placement.   Plan for port-a-catheter placement on 05/19/23 with Dr. Irine Manning  Risks and benefits of image guided port-a-catheter placement was discussed with the patient including, but not limited to bleeding, infection,  pneumothorax, or fibrin sheath development and need for additional procedures.  All of the patient's questions were answered, patient is agreeable to proceed. Consent signed and in chart.  Thank you for this interesting consult. I greatly enjoyed meeting Carlo H Breece and look forward to participating in their care. A copy of this report was sent to the requesting provider on this date.  Electronically Signed: Chantalle Defilippo M Trini Christiansen, PA-C 05/19/2023, 2:10 PM   I spent a total of  15 Minutes in face to face clinical consultation, greater than 50% of which was counseling/coordinating care for port placement.

## 2023-05-17 ENCOUNTER — Encounter: Payer: Self-pay | Admitting: Emergency Medicine

## 2023-05-17 ENCOUNTER — Emergency Department
Admission: EM | Admit: 2023-05-17 | Discharge: 2023-05-17 | Disposition: A | Discharge status: Home / Self Care | Payer: Medicare Other | Attending: Emergency Medicine | Admitting: Emergency Medicine

## 2023-05-17 ENCOUNTER — Emergency Department: Payer: Medicare Other

## 2023-05-17 ENCOUNTER — Other Ambulatory Visit: Payer: Self-pay

## 2023-05-17 DIAGNOSIS — R109 Unspecified abdominal pain: Secondary | ICD-10-CM | POA: Insufficient documentation

## 2023-05-17 DIAGNOSIS — D649 Anemia, unspecified: Secondary | ICD-10-CM | POA: Diagnosis not present

## 2023-05-17 DIAGNOSIS — D696 Thrombocytopenia, unspecified: Secondary | ICD-10-CM | POA: Insufficient documentation

## 2023-05-17 DIAGNOSIS — R110 Nausea: Secondary | ICD-10-CM | POA: Diagnosis not present

## 2023-05-17 HISTORY — DX: Leukemia, unspecified not having achieved remission: C95.90

## 2023-05-17 LAB — COMPREHENSIVE METABOLIC PANEL
ALT: 13 U/L (ref 0–44)
AST: 17 U/L (ref 15–41)
Albumin: 3.7 g/dL (ref 3.5–5.0)
Alkaline Phosphatase: 70 U/L (ref 38–126)
Anion gap: 7 (ref 5–15)
BUN: 16 mg/dL (ref 8–23)
CO2: 25 mmol/L (ref 22–32)
Calcium: 8.4 mg/dL — ABNORMAL LOW (ref 8.9–10.3)
Chloride: 102 mmol/L (ref 98–111)
Creatinine, Ser: 0.79 mg/dL (ref 0.44–1.00)
GFR, Estimated: >60 mL/min (ref >60)
Glucose, Bld: 98 mg/dL (ref 70–99)
Potassium: 4.1 mmol/L (ref 3.5–5.1)
Sodium: 134 mmol/L — ABNORMAL LOW (ref 135–145)
Total Bilirubin: 0.4 mg/dL (ref 0.0–1.2)
Total Protein: 6.5 g/dL (ref 6.5–8.1)

## 2023-05-17 LAB — URINALYSIS, ROUTINE W REFLEX MICROSCOPIC
Bacteria, UA: NONE SEEN
Bilirubin Urine: NEGATIVE
Glucose, UA: NEGATIVE mg/dL
Ketones, ur: NEGATIVE mg/dL
Leukocytes,Ua: NEGATIVE
Nitrite: NEGATIVE
Protein, ur: NEGATIVE mg/dL
Specific Gravity, Urine: >1.046 — ABNORMAL HIGH (ref 1.005–1.030)
pH: 5 (ref 5.0–8.0)

## 2023-05-17 LAB — CBC
HCT: 17.9 % — ABNORMAL LOW (ref 36.0–46.0)
Hemoglobin: 6 g/dL — ABNORMAL LOW (ref 12.0–15.0)
MCH: 30.3 pg (ref 26.0–34.0)
MCHC: 33.5 g/dL (ref 30.0–36.0)
MCV: 90.4 fL (ref 80.0–100.0)
Platelets: 9 10*3/uL — CL (ref 150–400)
RBC: 1.98 MIL/uL — ABNORMAL LOW (ref 3.87–5.11)
RDW: 18.2 % — ABNORMAL HIGH (ref 11.5–15.5)
WBC: 12.6 10*3/uL — ABNORMAL HIGH (ref 4.0–10.5)
nRBC: 0.2 % (ref 0.0–0.2)

## 2023-05-17 LAB — PREPARE RBC (CROSSMATCH)

## 2023-05-17 LAB — LIPASE, BLOOD: Lipase: 22 U/L (ref 11–51)

## 2023-05-17 MED ORDER — SODIUM CHLORIDE 0.9 % IV SOLN: 10.0000 mL/h | Freq: Once | INTRAVENOUS | Status: DC

## 2023-05-17 MED ORDER — ONDANSETRON HCL 4 MG/2ML IJ SOLN
4.0000 mg | Freq: Once | INTRAMUSCULAR | Status: AC
Start: 2023-05-17 — End: 2023-05-17
  Administered 2023-05-17: 4 mg via INTRAVENOUS
  Filled 2023-05-17: qty 2

## 2023-05-17 MED ORDER — FENTANYL CITRATE PF 50 MCG/ML IJ SOSY
100.0000 ug | PREFILLED_SYRINGE | Freq: Once | INTRAMUSCULAR | Status: AC
  Administered 2023-05-17: 100 ug via INTRAVENOUS
  Filled 2023-05-17: qty 2

## 2023-05-17 MED ORDER — ACETAMINOPHEN 500 MG PO TABS
1000.0000 mg | ORAL_TABLET | Freq: Once | ORAL | Status: AC
  Administered 2023-05-17: 1000 mg via ORAL
  Filled 2023-05-17: qty 2

## 2023-05-17 MED ORDER — OXYCODONE-ACETAMINOPHEN 5-325 MG PO TABS: 1.0000 | ORAL_TABLET | ORAL | 0 refills | Status: DC | PRN

## 2023-05-17 MED ORDER — DIPHENHYDRAMINE HCL 50 MG/ML IJ SOLN
50.0000 mg | Freq: Once | INTRAMUSCULAR | Status: AC
  Administered 2023-05-17: 50 mg via INTRAVENOUS
  Filled 2023-05-17: qty 1

## 2023-05-17 MED ORDER — OXYCODONE HCL 5 MG PO TABS
5.0000 mg | ORAL_TABLET | Freq: Once | ORAL | Status: AC
  Administered 2023-05-17: 5 mg via ORAL
  Filled 2023-05-17: qty 1

## 2023-05-17 MED ORDER — IOHEXOL 300 MG/ML  SOLN
100.0000 mL | Freq: Once | INTRAMUSCULAR | Status: AC | PRN
  Administered 2023-05-17: 100 mL via INTRAVENOUS

## 2023-05-17 MED ORDER — SODIUM CHLORIDE 0.9 % IV BOLUS
1000.0000 mL | Freq: Once | INTRAVENOUS | Status: AC
  Administered 2023-05-17: 1000 mL via INTRAVENOUS

## 2023-05-17 NOTE — ED Provider Notes (Signed)
 [facility] Provider Note    Event Date/Time   First MD Initiated Contact with Patient 05/17/23 1226     (approximate)  History   Chief Complaint: Flank Pain  HPI  [name] is a 68 y.o. female with a past medical history of gastric reflux, leukemia on chemotherapy, presents to the emergency department for left flank pain.  According to the patient for the past 2 days or so patient has been experiencing fairly significant pain to her left flank.  States it is worse with movement.  Patient states some nausea but denies any vomiting denies any diarrhea.  Denies any hematuria or dysuria.  No fever.  Patient denies any known injury.  Physical Exam   Triage Vital Signs: ED Triage Vitals  Encounter Vitals Group     BP 05/17/23 1201 (!) 114/52     Systolic BP Percentile --      Diastolic BP Percentile --      Pulse Rate 05/17/23 1201 (!) 101     Resp 05/17/23 1201 16     Temp 05/17/23 1201 98.4 F (36.9 C)     Temp Source 05/17/23 1201 Oral     SpO2 05/17/23 1201 98 %     Weight 05/17/23 1159 159 lb 13.3 oz (72.5 kg)     Height --      Head Circumference --      Peak Flow --      Pain Score 05/17/23 1159 9     Pain Loc --      Pain Education --      Exclude from Growth Chart --     Most recent vital signs: Vitals:   05/17/23 1201  BP: (!) 114/52  Pulse: (!) 101  Resp: 16  Temp: 98.4 F (36.9 C)  SpO2: 98%    General: Awake, no distress.  CV:  Good peripheral perfusion.  Regular rate and rhythm  Resp:  Normal effort.  Equal breath sounds bilaterally.  Abd:  No distention.  Soft, nontender.  No rebound or guarding.  ED Results / Procedures / Treatments   RADIOLOGY  I have reviewed interpret the CT images.  No large bleed seen on my evaluation or other significant abnormality. Radiology is read the CT scan small left pleural effusion.  Several right lower lobe nodules.  MEDICATIONS ORDERED IN ED: Medications  ondansetron  (ZOFRAN )  injection 4 mg (has no administration in time range)  fentaNYL  (SUBLIMAZE ) injection 100 mcg (has no administration in time range)     IMPRESSION / MDM / ASSESSMENT AND PLAN / ED COURSE  I reviewed the triage vital signs and the nursing notes.  Patient's presentation is most consistent with acute presentation with potential threat to life or bodily function.  Patient presents to the emergency department for left flank pain worse with movement.  Exam is reassuring, no abdominal tenderness.  No CVA tenderness.  No rash or lesions.  Differential is quite broad but would include musculoskeletal pain, neuropathic pain, ureterolithiasis UTI or pyelonephritis, colitis or diverticulitis.  We will check labs treat pain nausea IV hydrate and obtain a CT scan of the abdomen/pelvis to further evaluate.  Patient agreeable to plan of care.  Patient's workup in the emergency department overall reassuring, chemistry shows no significant finding, normal LFTs and lipase.  CBC does show a low hemoglobin 6.0 as well as low platelet count of 9.  CT scan does not appear to show any significant abnormality.  I was  able to speak to hematology/oncology at [name] where the patient receives her care.  They recommend transfusing 1 unit of platelets as well as 1 unit of packed red blood cells.  No obvious cause for the patient's left flank pain besides the pleural effusion which could be contributing to her pain.  Patient strongly wishes to go home.  Will discharge after transfusion, patient will follow-up with her hematology/oncology team on Monday.  Will provide pain medication for the patient.  Patient agreeable to plan.  [facility] has requested irradiated products if possible.  FINAL CLINICAL IMPRESSION(S) / ED DIAGNOSES   Left flank pain Anemia Thrombocytopenia   Note:  This document was prepared using Dragon voice recognition software and may include unintentional dictation errors.   Dorothyann Drivers, MD 05/17/23 [phone]

## 2023-05-17 NOTE — ED Notes (Signed)
 Patient discharged from ED by provider. Discharge instructions reviewed with patient and all questions answered. Patient wheeled from ED in NAD.

## 2023-05-17 NOTE — ED Triage Notes (Signed)
 Left flank pain x 1 day.  Hx leukemia, had Chemo yesterday.  No fever/chills.  No new nausea.  Denies D/V

## 2023-05-17 NOTE — ED Provider Notes (Signed)
 Patient received in signout from Dr. Dorothyann pending transfusions of 1 unit of PRBC as well as platelets after consultation with hematology/oncology in the setting of known leukemia.  Transfusions provided without complication.  Suitable for outpatient management per original plan of care.  .Critical Care  Performed by: Claudene Rover, MD Authorized by: Claudene Rover, MD   Critical care provider statement:    Critical care time (minutes):  30   Critical care time was exclusive of:  Separately billable procedures and treating other patients   Critical care was necessary to treat or prevent imminent or life-threatening deterioration of the following conditions:  Circulatory failure   Critical care was time spent personally by me on the following activities:  Development of treatment plan with patient or surrogate, discussions with consultants, evaluation of patient's response to treatment, examination of patient, ordering and review of laboratory studies, ordering and review of radiographic studies, ordering and performing treatments and interventions, pulse oximetry, re-evaluation of patient's condition and review of old charts     Claudene Rover, MD 05/17/23 2200

## 2023-05-17 NOTE — Discharge Instructions (Addendum)
 Please follow-up with your oncology team on Monday.  Return to the emergency department for any worsening pain or any other symptom personally concerning to yourself.

## 2023-05-18 LAB — PREPARE PLATELET PHERESIS: Unit division: 0

## 2023-05-18 LAB — BPAM PLATELET PHERESIS
Blood Product Expiration Date: 202502082359
ISSUE DATE / TIME: 202502081947
Unit Type and Rh: 6200

## 2023-05-18 LAB — BPAM RBC
Blood Product Expiration Date: 202502232359
ISSUE DATE / TIME: 202502081617
Unit Type and Rh: 5100

## 2023-05-18 LAB — TYPE AND SCREEN
ABO/RH(D): A POS
Antibody Screen: NEGATIVE
Unit division: 0

## 2023-05-19 ENCOUNTER — Inpatient Hospital Stay: Payer: Medicare Other

## 2023-05-19 ENCOUNTER — Ambulatory Visit
Admission: RE | Admit: 2023-05-19 | Discharge: 2023-05-19 | Disposition: A | Discharge status: Home / Self Care | Payer: Medicare Other | Source: Ambulatory Visit | Attending: Oncology | Admitting: Oncology

## 2023-05-19 ENCOUNTER — Telehealth: Payer: Self-pay

## 2023-05-19 ENCOUNTER — Other Ambulatory Visit: Payer: Self-pay

## 2023-05-19 ENCOUNTER — Other Ambulatory Visit: Payer: Medicare Other

## 2023-05-19 DIAGNOSIS — K219 Gastro-esophageal reflux disease without esophagitis: Secondary | ICD-10-CM | POA: Diagnosis not present

## 2023-05-19 DIAGNOSIS — D696 Thrombocytopenia, unspecified: Secondary | ICD-10-CM

## 2023-05-19 DIAGNOSIS — R110 Nausea: Secondary | ICD-10-CM | POA: Insufficient documentation

## 2023-05-19 DIAGNOSIS — C9200 Acute myeloblastic leukemia, not having achieved remission: Secondary | ICD-10-CM

## 2023-05-19 DIAGNOSIS — Z5111 Encounter for antineoplastic chemotherapy: Secondary | ICD-10-CM | POA: Diagnosis not present

## 2023-05-19 HISTORY — PX: IR IMAGING GUIDED PORT INSERTION: IMG5740

## 2023-05-19 LAB — CBC WITH DIFFERENTIAL (CANCER CENTER ONLY)
Abs Immature Granulocytes: 0.5 10*3/uL — ABNORMAL HIGH (ref 0.00–0.07)
Basophils Absolute: 0 10*3/uL (ref 0.0–0.1)
Basophils Relative: 0 %
Blasts: 32 %
Eosinophils Absolute: 0.2 10*3/uL (ref 0.0–0.5)
Eosinophils Relative: 2 %
HCT: 21.4 % — ABNORMAL LOW (ref 36.0–46.0)
Hemoglobin: 7.2 g/dL — ABNORMAL LOW (ref 12.0–15.0)
Lymphocytes Relative: 39 %
Lymphs Abs: 4.5 10*3/uL — ABNORMAL HIGH (ref 0.7–4.0)
MCH: 29.8 pg (ref 26.0–34.0)
MCHC: 33.6 g/dL (ref 30.0–36.0)
MCV: 88.4 fL (ref 80.0–100.0)
Metamyelocytes Relative: 1 %
Monocytes Absolute: 1.7 10*3/uL — ABNORMAL HIGH (ref 0.1–1.0)
Monocytes Relative: 15 %
Myelocytes: 3 %
Neutro Abs: 0.9 10*3/uL — ABNORMAL LOW (ref 1.7–7.7)
Neutrophils Relative %: 8 %
Platelet Count: 10 10*3/uL — ABNORMAL LOW (ref 150–400)
RBC: 2.42 MIL/uL — ABNORMAL LOW (ref 3.87–5.11)
RDW: 17.4 % — ABNORMAL HIGH (ref 11.5–15.5)
Smear Review: NORMAL
WBC Count: 11.5 10*3/uL — ABNORMAL HIGH (ref 4.0–10.5)
WBC Morphology: 28
nRBC: 0 % (ref 0.0–0.2)

## 2023-05-19 LAB — SAMPLE TO BLOOD BANK

## 2023-05-19 MED ORDER — SODIUM CHLORIDE 0.9 % IV SOLN: INTRAVENOUS | Status: DC

## 2023-05-19 MED ORDER — LIDOCAINE-EPINEPHRINE 1 %-1:100000 IJ SOLN
7.0000 mL | Freq: Once | INTRAMUSCULAR | Status: AC
  Administered 2023-05-19: 7 mL via INTRADERMAL

## 2023-05-19 MED ORDER — FENTANYL CITRATE (PF) 100 MCG/2ML IJ SOLN
INTRAMUSCULAR | Status: AC | PRN
  Administered 2023-05-19 (×2): 50 ug via INTRAVENOUS
  Administered 2023-05-19: 25 ug via INTRAVENOUS

## 2023-05-19 MED ORDER — MIDAZOLAM HCL 2 MG/2ML IJ SOLN
INTRAMUSCULAR | Status: AC
  Filled 2023-05-19: qty 2

## 2023-05-19 MED ORDER — LIDOCAINE HCL 1 % IJ SOLN
INTRAMUSCULAR | Status: AC
  Filled 2023-05-19: qty 20

## 2023-05-19 MED ORDER — FENTANYL CITRATE (PF) 100 MCG/2ML IJ SOLN
INTRAMUSCULAR | Status: AC
  Filled 2023-05-19: qty 2

## 2023-05-19 MED ORDER — MIDAZOLAM HCL 2 MG/2ML IJ SOLN
INTRAMUSCULAR | Status: AC | PRN
Start: 2023-05-19 — End: 2023-05-19
  Administered 2023-05-19 (×2): 1 mg via INTRAVENOUS

## 2023-05-19 MED ORDER — HEPARIN SOD (PORK) LOCK FLUSH 100 UNIT/ML IV SOLN
INTRAVENOUS | Status: AC
  Filled 2023-05-19: qty 5

## 2023-05-19 MED ORDER — LIDOCAINE-EPINEPHRINE 1 %-1:100000 IJ SOLN
INTRAMUSCULAR | Status: AC
  Filled 2023-05-19: qty 1

## 2023-05-19 MED ORDER — LIDOCAINE HCL 1 % IJ SOLN
10.0000 mL | Freq: Once | INTRAMUSCULAR | Status: AC
  Administered 2023-05-19: 10 mL via INTRADERMAL

## 2023-05-19 NOTE — Telephone Encounter (Addendum)
 Received critical lab from Chester Costa @ [facility] lab  32% blast.   Dr. Wilhelmenia Harada notified. No blood or platelets recommended unless pt is symptomatic.   Spoke to pt and  she states that she is not having symptoms. Pt states that she went to ER over the weekend and had platelet transfusion. Advised pt that if she starts getting symptomatic she can contact us  back for lab recheck/ poss blood. pt verbalized understanding. Pt advised to keep future appts as scheduled.

## 2023-05-19 NOTE — Procedures (Signed)
 Interventional Radiology Procedure:   Indications: Acute myeloid leukemia  Procedure: Port placement  Findings: Right jugular port, tip at SVC/RA junction.    Complications: None     EBL: Minimal, less than 10 ml  Plan: Discharge in one hour.  Keep port site and incisions dry for at least 24 hours.     Harvie Morua R. Julietta Ogren, MD  Pager: [phone]

## 2023-05-20 ENCOUNTER — Other Ambulatory Visit: Payer: Self-pay | Admitting: Oncology

## 2023-05-20 DIAGNOSIS — D696 Thrombocytopenia, unspecified: Secondary | ICD-10-CM

## 2023-05-20 MED ORDER — LIDOCAINE-PRILOCAINE 2.5-2.5 % EX CREA: 1.0000 | TOPICAL_CREAM | CUTANEOUS | 0 refills | Status: DC | PRN

## 2023-05-21 ENCOUNTER — Ambulatory Visit: Payer: Medicare Other

## 2023-05-21 ENCOUNTER — Inpatient Hospital Stay: Payer: Medicare Other

## 2023-05-22 ENCOUNTER — Inpatient Hospital Stay: Payer: Medicare Other

## 2023-05-22 DIAGNOSIS — D696 Thrombocytopenia, unspecified: Secondary | ICD-10-CM

## 2023-05-22 DIAGNOSIS — Z5111 Encounter for antineoplastic chemotherapy: Secondary | ICD-10-CM | POA: Diagnosis not present

## 2023-05-22 MED ORDER — ACETAMINOPHEN 325 MG PO TABS
650.0000 mg | ORAL_TABLET | Freq: Once | ORAL | Status: AC
Start: 2023-05-22 — End: 2023-05-22
  Administered 2023-05-22: 650 mg via ORAL
  Filled 2023-05-22: qty 2

## 2023-05-22 MED ORDER — SODIUM CHLORIDE 0.9% IV SOLUTION
250.0000 mL | INTRAVENOUS | Status: DC
  Administered 2023-05-22: 250 mL via INTRAVENOUS
  Filled 2023-05-22: qty 250

## 2023-05-23 LAB — PREPARE PLATELET PHERESIS: Unit division: 0

## 2023-05-23 LAB — BPAM PLATELET PHERESIS
Blood Product Expiration Date: 202502142359
ISSUE DATE / TIME: 202502131331
Unit Type and Rh: 202502142359
Unit Type and Rh: 7300

## 2023-05-26 ENCOUNTER — Inpatient Hospital Stay: Payer: Medicare Other

## 2023-05-26 ENCOUNTER — Inpatient Hospital Stay (HOSPITAL_BASED_OUTPATIENT_CLINIC_OR_DEPARTMENT_OTHER): Payer: Medicare Other | Admitting: Oncology

## 2023-05-26 ENCOUNTER — Encounter: Payer: Self-pay | Admitting: Oncology

## 2023-05-26 VITALS — BP 127/54 | HR 86

## 2023-05-26 VITALS — BP 117/55 | HR 96 | Temp 97.2°F | Resp 18 | Wt 158.2 lb

## 2023-05-26 DIAGNOSIS — Z5111 Encounter for antineoplastic chemotherapy: Secondary | ICD-10-CM | POA: Diagnosis not present

## 2023-05-26 DIAGNOSIS — D649 Anemia, unspecified: Secondary | ICD-10-CM

## 2023-05-26 DIAGNOSIS — D696 Thrombocytopenia, unspecified: Secondary | ICD-10-CM | POA: Diagnosis not present

## 2023-05-26 DIAGNOSIS — C9200 Acute myeloblastic leukemia, not having achieved remission: Secondary | ICD-10-CM

## 2023-05-26 DIAGNOSIS — D709 Neutropenia, unspecified: Secondary | ICD-10-CM

## 2023-05-26 LAB — CBC WITH DIFFERENTIAL (CANCER CENTER ONLY)
Abs Immature Granulocytes: 2.1 10*3/uL — ABNORMAL HIGH (ref 0.00–0.07)
Basophils Absolute: 0 10*3/uL (ref 0.0–0.1)
Basophils Relative: 0 %
Blasts: 39 %
Eosinophils Absolute: 0 10*3/uL (ref 0.0–0.5)
Eosinophils Relative: 0 %
HCT: 18.5 % — ABNORMAL LOW (ref 36.0–46.0)
Hemoglobin: 6.3 g/dL — CL (ref 12.0–15.0)
Lymphocytes Relative: 43 %
Lymphs Abs: 13.1 10*3/uL — ABNORMAL HIGH (ref 0.7–4.0)
MCH: 30.1 pg (ref 26.0–34.0)
MCHC: 34.1 g/dL (ref 30.0–36.0)
MCV: 88.5 fL (ref 80.0–100.0)
Metamyelocytes Relative: 3 %
Monocytes Absolute: 3 10*3/uL — ABNORMAL HIGH (ref 0.1–1.0)
Monocytes Relative: 10 %
Myelocytes: 3 %
Neutro Abs: 0.3 10*3/uL — CL (ref 1.7–7.7)
Neutrophils Relative %: 1 %
Platelet Count: 6 10*3/uL — CL (ref 150–400)
Promyelocytes Relative: 1 %
RBC: 2.09 MIL/uL — ABNORMAL LOW (ref 3.87–5.11)
RDW: 17.5 % — ABNORMAL HIGH (ref 11.5–15.5)
Smear Review: NORMAL
WBC Count: 30.4 10*3/uL — ABNORMAL HIGH (ref 4.0–10.5)
nRBC: 0.9 % — ABNORMAL HIGH (ref 0.0–0.2)

## 2023-05-26 LAB — PREPARE RBC (CROSSMATCH)

## 2023-05-26 MED ORDER — LEVOFLOXACIN 250 MG PO TABS: 500.0000 mg | ORAL_TABLET | Freq: Every day | ORAL | 2 refills | Status: DC

## 2023-05-26 MED ORDER — DEXAMETHASONE SODIUM PHOSPHATE 10 MG/ML IJ SOLN
10.0000 mg | Freq: Once | INTRAMUSCULAR | Status: DC
Start: 2023-05-26 — End: 2023-05-26

## 2023-05-26 MED ORDER — PANTOPRAZOLE SODIUM 20 MG PO TBEC: 20.0000 mg | DELAYED_RELEASE_TABLET | Freq: Every day | ORAL | 0 refills | Status: DC

## 2023-05-26 MED ORDER — ONDANSETRON HCL 4 MG/2ML IJ SOLN
4.0000 mg | Freq: Once | INTRAMUSCULAR | Status: AC
  Administered 2023-05-26: 4 mg via INTRAVENOUS
  Filled 2023-05-26: qty 2

## 2023-05-26 MED ORDER — SODIUM CHLORIDE 0.9 % IV SOLN
Freq: Once | INTRAVENOUS | Status: AC
  Filled 2023-05-26: qty 250

## 2023-05-26 NOTE — Progress Notes (Signed)
 Hematology/Oncology Consult note [facility]  [phone] [phone]  Patient Care Team: Corky Downs, MD as PCP - General (Internal Medicine)   Name of the patient: [name]  [id number]  1956-02-02   Date of visit: 05/26/23  Diagnosis-acute myeloid leukemia  Chief complaint/ Reason for visit-routine follow-up of acute myeloid leukemia s/p 1 cycle of Decitabin  Heme/Onc history: Patient is a 68 year old female with a past medical history significant for GERD with no other major comorbidities who was seen at [facility] with symptoms of nausea vomiting and diarrhea.  Labs showed symptomatic anemia significant thrombocytopenia and a white cell count of 33.  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  64% blasts on manual aspirate differential.  Karyotype was normal.  SF 3 B1, TET 2, R UN X1, P HF 6 and FLT3 mutations were detected.     She was seen by Dr. Bradly Bienenstock from [facility] and patient did not desire intensive induction chemotherapy and consideration for transplant.  He gave her various treatment options including metronomic therapy with low-dose weekly decitabine plus venetoclax versus azacitidine plus venetoclax versus single agent hypomethylating agent such as azacitidine or decitabine alone.  However patient declined treatment and she was concerned about travel to [facility] for treatment.     She is presently agreeable to proceeding with Decitabin monotherapy IV 5 days each month.  She has a port in place  Interval history-feels poorly today.  She is experiencing more fatigue as well as nausea.  Appetite has been poor  ECOG PS- 1 Pain scale- 0   Review of systems- Review of Systems  Constitutional:  Positive for malaise/fatigue. Negative for chills, fever and weight loss.  HENT:  Negative for congestion, ear discharge and nosebleeds.   Eyes:  Negative for  blurred vision.  Respiratory:  Negative for cough, hemoptysis, sputum production, shortness of breath and wheezing.   Cardiovascular:  Negative for chest pain, palpitations, orthopnea and claudication.  Gastrointestinal:  Positive for nausea. Negative for abdominal pain, blood in stool, constipation, diarrhea, heartburn, melena and vomiting.  Genitourinary:  Negative for dysuria, flank pain, frequency, hematuria and urgency.  Musculoskeletal:  Negative for back pain, joint pain and myalgias.  Skin:  Negative for rash.  Neurological:  Negative for dizziness, tingling, focal weakness, seizures, weakness and headaches.  Endo/Heme/Allergies:  Does not bruise/bleed easily.  Psychiatric/Behavioral:  Negative for depression and suicidal ideas. The patient does not have insomnia.       Allergies  Allergen Reactions   Prednisone Anaphylaxis    Pt states it turned her into a monster, lips started swelling and couldn't breath   Amoxicillin    Codeine    Heparin      Past Medical History:  Diagnosis Date   GERD (gastroesophageal reflux disease)    Leukemia (HCC)      Past Surgical History:  Procedure Laterality Date   CARDIAC SURGERY     CHOLECYSTECTOMY     HIP ARTHROSCOPY     IR IMAGING GUIDED PORT INSERTION  05/19/2023   surgery on left foot Left     Social History   Socioeconomic History   Marital status: Widowed    Spouse name: Not on file   Number of children: Not on file   Years of education: Not on file   Highest education level: Not on file  Occupational History   Not on file  Tobacco Use   Smoking status: Every Day    Current packs/day: 0.25    Average packs/day: 0.3 packs/day for 4.0 years (1.0 ttl pk-yrs)    Types: Cigarettes   Smokeless tobacco: Never  Vaping Use   Vaping status: Never Used  Substance and Sexual Activity   Alcohol use: Not Currently   Drug use: Not Currently   Sexual activity: Not Currently  Other Topics Concern   Not on file  Social  History Narrative   Not on file   Social Drivers of Health   Financial Resource Strain: Low Risk  (03/05/2023)   Received from [facility]   Overall Financial Resource Strain (CARDIA)    Difficulty of Paying Living Expenses: Not very hard  Food Insecurity: No Food Insecurity (05/06/2023)   Hunger Vital Sign    Worried About Running Out of Food in the Last Year: Never true    Ran Out of Food in the Last Year: Never true  Transportation Needs: No Transportation Needs (05/06/2023)   PRAPARE - Administrator, Civil Service (Medical): No    Lack of Transportation (Non-Medical): No  Physical Activity: Insufficiently Active (03/22/2021)   Exercise Vital Sign    Days of Exercise per Week: 2 days    Minutes of Exercise per Session: 10 min  Stress: No Stress Concern Present (03/22/2021)   [employer] of Occupational Health - Occupational Stress Questionnaire    Feeling of Stress : Not at all  Social Connections: Socially Isolated (03/22/2021)   Social Connection and Isolation Panel [NHANES]    Frequency of Communication with Friends and Family: More than three times a week    Frequency of Social Gatherings with Friends and Family: Not on file    Attends Religious Services: Never    Active Member of Clubs or Organizations: No    Attends Banker Meetings: Never    Marital Status: Widowed  Intimate Partner Violence: Not At Risk (05/06/2023)   Humiliation, Afraid, Rape, and Kick questionnaire    Fear of Current or Ex-Partner: No    Emotionally Abused: No    Physically Abused: No    Sexually Abused: No    Family History  Problem Relation Age of Onset   Cancer Mother    Heart failure Father    Heart failure Sister    Cancer Maternal Grandmother    Cancer Maternal Grandfather      Current Outpatient Medications:    allopurinol (ZYLOPRIM) 300 MG tablet, Take 1 tablet (300 mg total) by mouth daily., Disp: 30 tablet, Rfl: 2   ALPRAZolam (XANAX) 0.5 MG  tablet, Take 0.5 mg by mouth at bedtime as needed for sleep., Disp: , Rfl:    ascorbic acid (VITAMIN C) 500 MG tablet, Take 500 mg by mouth daily., Disp: , Rfl:    b complex vitamins capsule, Take 1 capsule by mouth daily., Disp: , Rfl:    cholecalciferol (VITAMIN D3) 10 MCG (400 UNIT) TABS tablet, Take 400 Units by mouth., Disp: , Rfl:    enalapril (VASOTEC) 20 MG tablet, TAKE 1 TABLET BY MOUTH ONCE A DAY (Patient not taking: Reported on 05/06/2023), Disp: 30 tablet, Rfl: 6   hydroxyurea (HYDREA) 500 MG capsule, Take 1,000 mg by mouth 2 (two) times daily. May take with food to minimize GI side effects., Disp: , Rfl:    ibuprofen (ADVIL,MOTRIN) 600 MG tablet, Take 1 tablet (600 mg total) by mouth every 6 (six) hours as needed., Disp: 30 tablet,  Rfl: 0   lidocaine-prilocaine (EMLA) cream, Apply 1 Application topically as needed., Disp: 30 g, Rfl: 0   omeprazole (PRILOSEC) 20 MG capsule, TAKE 1 CAPSULE BY MOUTH ONCE DAILY (Patient not taking: Reported on 05/06/2023), Disp: 90 capsule, Rfl: 3   ondansetron (ZOFRAN) 8 MG tablet, Take 1 tablet (8 mg total) by mouth every 8 (eight) hours as needed for nausea or vomiting., Disp: 30 tablet, Rfl: 1   oxyCODONE-acetaminophen (PERCOCET) 5-325 MG tablet, Take 1 tablet by mouth every 4 (four) hours as needed for severe pain (pain score 7-10)., Disp: 20 tablet, Rfl: 0   pantoprazole (PROTONIX) 20 MG tablet, Take 20 mg by mouth daily., Disp: , Rfl:    polyethylene glycol (MIRALAX / GLYCOLAX) 17 g packet, Take 17 g by mouth daily as needed for mild constipation., Disp: , Rfl:    prochlorperazine (COMPAZINE) 10 MG tablet, Take 1 tablet (10 mg total) by mouth every 6 (six) hours as needed for nausea or vomiting., Disp: 30 tablet, Rfl: 1   valACYclovir (VALTREX) 500 MG tablet, Take 1 tablet (500 mg total) by mouth 2 (two) times daily., Disp: 60 tablet, Rfl: 1  Physical exam: There were no vitals filed for this visit. Physical Exam Constitutional:      Comments:  Appears fatigued  Cardiovascular:     Rate and Rhythm: Normal rate and regular rhythm.     Heart sounds: Normal heart sounds.  Pulmonary:     Effort: Pulmonary effort is normal.     Breath sounds: Normal breath sounds.  Abdominal:     General: Bowel sounds are normal.     Palpations: Abdomen is soft.  Musculoskeletal:     Comments: There is diffuse bruising noted over port site and the bruising tracks down to her right breast  Skin:    General: Skin is warm and dry.  Neurological:     Mental Status: She is alert and oriented to person, place, and time.         Latest Ref Rng & Units 05/17/2023   12:08 PM  CMP  Glucose 70 - 99 mg/dL 98   BUN 8 - 23 mg/dL 16   Creatinine 1.61 - 1.00 mg/dL 0.96   Sodium 045 - 409 mmol/L 134   Potassium 3.5 - 5.1 mmol/L 4.1   Chloride 98 - 111 mmol/L 102   CO2 22 - 32 mmol/L 25   Calcium 8.9 - 10.3 mg/dL 8.4   Total Protein 6.5 - 8.1 g/dL 6.5   Total Bilirubin 0.0 - 1.2 mg/dL 0.4   Alkaline Phos 38 - 126 U/L 70   AST 15 - 41 U/L 17   ALT 0 - 44 U/L 13       Latest Ref Rng & Units 05/26/2023    1:19 PM  CBC  WBC 4.0 - 10.5 K/uL 30.4   Hemoglobin 12.0 - 15.0 g/dL 6.3   Hematocrit 81.1 - 46.0 % 18.5   Platelets 150 - 400 K/uL 6     No images are attached to the encounter.  IR IMAGING GUIDED PORT INSERTION Result Date: 05/19/2023 INDICATION: 68 year old with acute myeloid leukemia. Port-A-Cath needed for treatment. EXAM: FLUOROSCOPIC AND ULTRASOUND GUIDED PLACEMENT OF A SUBCUTANEOUS PORT COMPARISON:  None Available. MEDICATIONS: Moderate sedation ANESTHESIA/SEDATION: Moderate (conscious) sedation was employed during this procedure. A total of Versed 2 mg and fentanyl 75 mcg was administered intravenously at the order of the provider performing the procedure. Total intra-service moderate sedation time: 29 minutes. Patient's level of  consciousness and vital signs were monitored continuously by radiology nurse throughout the procedure under the  supervision of the provider performing the procedure. FLUOROSCOPY TIME:  Radiation Exposure Index (as provided by the fluoroscopic device): 1 mGy Kerma COMPLICATIONS: None immediate. PROCEDURE: The procedure, risks, benefits, and alternatives were explained to the patient. Questions regarding the procedure were encouraged and answered. The patient understands and consents to the procedure. Patient was placed supine on the interventional table. Ultrasound confirmed a patent right internal jugular vein. Ultrasound image was saved for documentation. The right chest and neck were cleaned with a skin antiseptic and a sterile drape was placed. Maximal barrier sterile technique was utilized including caps, mask, sterile gowns, sterile gloves, sterile drape, hand hygiene and skin antiseptic. The right neck was anesthetized with 1% lidocaine. Small incision was made in the right neck with a blade. Micropuncture set was placed in the right internal jugular vein with ultrasound guidance. The micropuncture wire was used for measurement purposes. The right chest was anesthetized with 1% lidocaine with epinephrine. #15 blade was used to make an incision and a subcutaneous port pocket was formed. 8 french Power Port was assembled. Subcutaneous tunnel was formed with a stiff tunneling device. The port catheter was brought through the subcutaneous tunnel. The port was placed in the subcutaneous pocket. The micropuncture set was exchanged for a peel-away sheath. The catheter was placed through the peel-away sheath and the tip was positioned at the superior cavoatrial junction. Catheter placement was confirmed with fluoroscopy. The port was accessed and flushed with saline. The port pocket was closed using two layers of absorbable sutures and Dermabond. The vein skin site was closed using a single layer of absorbable suture and Dermabond. Sterile dressings were applied. Patient tolerated the procedure well without an immediate  complication. Ultrasound and fluoroscopic images were taken and saved for this procedure. IMPRESSION: Placement of a subcutaneous power-injectable port device. Catheter tip at the superior cavoatrial junction. Electronically Signed   By: Richarda Overlie M.D.   On: 05/19/2023 15:38   CT ABDOMEN PELVIS W CONTRAST Result Date: 05/17/2023 CLINICAL DATA:  Left flank pain for 1 day. Status post chemotherapy yesterday for leukemia. EXAM: CT ABDOMEN AND PELVIS WITH CONTRAST TECHNIQUE: Multidetector CT imaging of the abdomen and pelvis was performed using the standard protocol following bolus administration of intravenous contrast. RADIATION DOSE REDUCTION: This exam was performed according to the departmental dose-optimization program which includes automated exposure control, adjustment of the mA and/or kV according to patient size and/or use of iterative reconstruction technique. CONTRAST:  OMNIPAQUE IOHEXOL 300 MG/ML  SOLN COMPARISON:  03/03/2023. FINDINGS: Lower chest: Interval small left pleural effusion. Subsegmental atelectasis/scarring in the lingula. Mild compressive atelectasis in the left lower lobe. Several interval small nodular densities in the right lower lobe measuring up to 3 mm in maximum diameter on image number 12/4. Borderline enlarged heart. Hepatobiliary: No focal liver abnormality is seen. Status post cholecystectomy. No biliary dilatation. Pancreas: Unremarkable. No pancreatic ductal dilatation or surrounding inflammatory changes. Spleen: Stable enlarged spleen. Adrenals/Urinary Tract: Adrenal glands are unremarkable. Kidneys are normal, without renal calculi, focal lesion, or hydronephrosis. Bladder is unremarkable. Stomach/Bowel: Stable moderately large distal duodenal diverticulum with an air-fluid level. Small to moderate-sized hiatal hernia. Mild colonic diverticulosis without evidence of diverticulitis. Normal-appearing appendix. Vascular/Lymphatic: Atheromatous arterial calcifications  without aneurysm. Multiple mildly enlarged mesenteric lymph nodes with a mild interval increase in size of some of the nodes. These include right upper pelvic node with a short axis diameter  of 9 mm on image number 46/7, previously 7 mm. Reproductive: Calcified uterine fibroids. Stable calcified and noncalcified uterine fibroids. No adnexal masses. Other: No abdominal wall hernia or abnormality. No abdominopelvic ascites. Musculoskeletal: Lumbar and lower thoracic spine degenerative changes.1 IMPRESSION: 1. No urinary tract calculi or hydronephrosis. 2. Interval small left pleural effusion with mild compressive atelectasis in the left lower lobe. 3. Several interval small nodular densities in the right lower lobe measuring up to 3 mm in maximum diameter. These are nonspecific. Attention to these on follow-up is recommended. 4. Stable splenomegaly. 5. Multiple mildly enlarged mesenteric lymph nodes with a mild interval increase in size of some of the nodes. These are most likely related to the patient's leukemia. 6. Small to moderate-sized hiatal hernia. 7. Mild colonic diverticulosis without evidence of diverticulitis. 8. Stable calcified and noncalcified uterine fibroids. Electronically Signed   By: Beckie Salts M.D.   On: 05/17/2023 13:31     Assessment and plan- Patient is a 69 y.o. female with history of acute myeloid leukemia adverse cytogenetics s/p 1 cycle of decitabine here for routine follow-up  Patient's white cell count is 30 today with 39% blasts which have gone up as compared to last week.  We are asked her to stop Hydrea when she started decitabine.  Given increase in her blast count have asked her to restart the Hydrea for now and depending on her counts next week I will have her stop taking the Hydrea again.  Hydrea can potentially worsen her cytopenias but are increasing blast count is also concerning and we would like to keep her out of a blast crisis at this time.  I discussed with patient  and her family member that we could be looking at adding venetoclax to decitabine if her counts are not controlled with decitabine alone.  I did reach out to Dr. Sharen Hones from [facility] as well and he is willing to see her next week to have this discussion.  It would likely be in patient's best interest to get venetoclax ramp-up as an inpatient given her rising blast count.  1 unit of PRBC and platelets tomorrow.  We will continue with weekly CBC monitoring and based on how she is doing we will see if they are able to proceed with treatment for cycle 2 of this to be in 2 weeks.  ANC 0.3 today and I am therefore starting her on Levaquin prophylaxis.  Neutropenic precautions have been reviewed.  Fall precautions reviewed  I have encouraged patient to think about her CODE STATUS as her prognosis is overall poor.  1 L of IV fluids today and I will tentatively see her back in 2 weeks for cycle 2 of decitabine   Visit Diagnosis 1. Acute myeloid leukemia not having achieved remission (HCC)   2. Symptomatic anemia   3. Severe neutropenia (HCC)   4. Severe thrombocytopenia (HCC)      Dr. Owens Shark, MD, MPH [facility] at [facility] [phone] 05/26/2023 1:22 PM

## 2023-05-27 ENCOUNTER — Inpatient Hospital Stay: Payer: Medicare Other

## 2023-05-27 DIAGNOSIS — Z5111 Encounter for antineoplastic chemotherapy: Secondary | ICD-10-CM | POA: Diagnosis not present

## 2023-05-27 DIAGNOSIS — C9200 Acute myeloblastic leukemia, not having achieved remission: Secondary | ICD-10-CM

## 2023-05-27 DIAGNOSIS — D649 Anemia, unspecified: Secondary | ICD-10-CM

## 2023-05-27 MED ORDER — ACETAMINOPHEN 325 MG PO TABS
650.0000 mg | ORAL_TABLET | Freq: Once | ORAL | Status: AC
  Administered 2023-05-27: 650 mg via ORAL
  Filled 2023-05-27: qty 2

## 2023-05-27 MED ORDER — SODIUM CHLORIDE 0.9% IV SOLUTION
250.0000 mL | INTRAVENOUS | Status: DC
Start: 2023-05-27 — End: 2023-05-27
  Administered 2023-05-27: 250 mL via INTRAVENOUS
  Filled 2023-05-27: qty 250

## 2023-05-28 ENCOUNTER — Inpatient Hospital Stay: Payer: Medicare Other

## 2023-05-28 LAB — BPAM RBC
Blood Product Expiration Date: 202503142359
ISSUE DATE / TIME: 202502181137
Unit Type and Rh: 202503142359
Unit Type and Rh: 5100

## 2023-05-28 LAB — TYPE AND SCREEN
ABO/RH(D): A POS
Antibody Screen: NEGATIVE
Unit division: 0

## 2023-05-28 LAB — PREPARE PLATELET PHERESIS: Unit division: 0

## 2023-05-28 LAB — BPAM PLATELET PHERESIS
Blood Product Expiration Date: 202502192359
ISSUE DATE / TIME: 202502181355
Unit Type and Rh: 202502192359
Unit Type and Rh: 6200

## 2023-05-30 ENCOUNTER — Other Ambulatory Visit: Payer: Self-pay

## 2023-06-02 ENCOUNTER — Inpatient Hospital Stay: Payer: Medicare Other

## 2023-06-02 ENCOUNTER — Other Ambulatory Visit: Payer: Self-pay | Admitting: Oncology

## 2023-06-02 DIAGNOSIS — C9200 Acute myeloblastic leukemia, not having achieved remission: Secondary | ICD-10-CM

## 2023-06-02 DIAGNOSIS — Z5111 Encounter for antineoplastic chemotherapy: Secondary | ICD-10-CM | POA: Diagnosis not present

## 2023-06-02 LAB — CBC WITH DIFFERENTIAL (CANCER CENTER ONLY)
Abs Immature Granulocytes: 1 10*3/uL — ABNORMAL HIGH (ref 0.00–0.07)
Basophils Absolute: 0 10*3/uL (ref 0.0–0.1)
Basophils Relative: 0 %
Blasts: 39 %
Eosinophils Absolute: 0 10*3/uL (ref 0.0–0.5)
Eosinophils Relative: 0 %
HCT: 22 % — ABNORMAL LOW (ref 36.0–46.0)
Hemoglobin: 7.3 g/dL — ABNORMAL LOW (ref 12.0–15.0)
Lymphocytes Relative: 33 %
Lymphs Abs: 10.7 10*3/uL — ABNORMAL HIGH (ref 0.7–4.0)
MCH: 30.8 pg (ref 26.0–34.0)
MCHC: 33.2 g/dL (ref 30.0–36.0)
MCV: 92.8 fL (ref 80.0–100.0)
Monocytes Absolute: 6.5 10*3/uL — ABNORMAL HIGH (ref 0.1–1.0)
Monocytes Relative: 20 %
Myelocytes: 3 %
Neutro Abs: 1.6 10*3/uL — ABNORMAL LOW (ref 1.7–7.7)
Neutrophils Relative %: 5 %
Platelet Count: 10 10*3/uL — ABNORMAL LOW (ref 150–400)
RBC: 2.37 MIL/uL — ABNORMAL LOW (ref 3.87–5.11)
RDW: 18.8 % — ABNORMAL HIGH (ref 11.5–15.5)
Smear Review: NORMAL
WBC Count: 32.3 10*3/uL — ABNORMAL HIGH (ref 4.0–10.5)
nRBC: 1.9 % — ABNORMAL HIGH (ref 0.0–0.2)

## 2023-06-02 LAB — SAMPLE TO BLOOD BANK

## 2023-06-04 ENCOUNTER — Inpatient Hospital Stay: Payer: Medicare Other

## 2023-06-04 DIAGNOSIS — C9200 Acute myeloblastic leukemia, not having achieved remission: Secondary | ICD-10-CM

## 2023-06-04 LAB — BPAM RBC
Blood Product Expiration Date: 202503172359
Unit Type and Rh: 600

## 2023-06-04 LAB — PREPARE PLATELET PHERESIS: Unit division: 0

## 2023-06-04 LAB — TYPE AND SCREEN
ABO/RH(D): A POS
Antibody Screen: NEGATIVE
Unit division: 0

## 2023-06-04 LAB — BPAM PLATELET PHERESIS
Blood Product Expiration Date: 202502262359
Unit Type and Rh: 5100

## 2023-06-04 LAB — PREPARE RBC (CROSSMATCH)

## 2023-06-06 ENCOUNTER — Other Ambulatory Visit: Payer: Self-pay | Admitting: Oncology

## 2023-06-06 ENCOUNTER — Encounter: Payer: Self-pay | Admitting: Oncology

## 2023-06-09 ENCOUNTER — Inpatient Hospital Stay: Payer: Medicare Other | Admitting: Oncology

## 2023-06-09 ENCOUNTER — Inpatient Hospital Stay: Payer: Medicare Other

## 2023-06-10 ENCOUNTER — Inpatient Hospital Stay: Payer: Medicare Other

## 2023-06-11 ENCOUNTER — Inpatient Hospital Stay: Payer: Medicare Other

## 2023-06-12 ENCOUNTER — Ambulatory Visit: Payer: Medicare Other

## 2023-06-13 ENCOUNTER — Other Ambulatory Visit: Payer: Self-pay

## 2023-06-13 ENCOUNTER — Ambulatory Visit: Payer: Medicare Other

## 2023-06-13 DIAGNOSIS — C9200 Acute myeloblastic leukemia, not having achieved remission: Secondary | ICD-10-CM

## 2023-06-13 DIAGNOSIS — D649 Anemia, unspecified: Secondary | ICD-10-CM

## 2023-06-13 DIAGNOSIS — D696 Thrombocytopenia, unspecified: Secondary | ICD-10-CM

## 2023-06-14 ENCOUNTER — Other Ambulatory Visit: Payer: Self-pay

## 2023-06-16 ENCOUNTER — Other Ambulatory Visit: Payer: Self-pay | Admitting: Nurse Practitioner

## 2023-06-16 ENCOUNTER — Other Ambulatory Visit: Payer: Self-pay

## 2023-06-16 ENCOUNTER — Inpatient Hospital Stay: Attending: Oncology

## 2023-06-16 DIAGNOSIS — D696 Thrombocytopenia, unspecified: Secondary | ICD-10-CM

## 2023-06-16 DIAGNOSIS — C9200 Acute myeloblastic leukemia, not having achieved remission: Secondary | ICD-10-CM

## 2023-06-16 DIAGNOSIS — D649 Anemia, unspecified: Secondary | ICD-10-CM

## 2023-06-16 LAB — CBC (CANCER CENTER ONLY)
HCT: 29.2 % — ABNORMAL LOW (ref 36.0–46.0)
Hemoglobin: 9.9 g/dL — ABNORMAL LOW (ref 12.0–15.0)
MCH: 28.6 pg (ref 26.0–34.0)
MCHC: 33.9 g/dL (ref 30.0–36.0)
MCV: 84.4 fL (ref 80.0–100.0)
Platelet Count: 6 10*3/uL — CL (ref 150–400)
RBC: 3.46 MIL/uL — ABNORMAL LOW (ref 3.87–5.11)
RDW: 15.9 % — ABNORMAL HIGH (ref 11.5–15.5)
WBC Count: 2.2 10*3/uL — ABNORMAL LOW (ref 4.0–10.5)
nRBC: 0.9 % — ABNORMAL HIGH (ref 0.0–0.2)

## 2023-06-16 LAB — CMP (CANCER CENTER ONLY)
ALT: 18 U/L (ref 0–44)
AST: 17 U/L (ref 15–41)
Albumin: 3.9 g/dL (ref 3.5–5.0)
Alkaline Phosphatase: 68 U/L (ref 38–126)
Anion gap: 7 (ref 5–15)
BUN: 13 mg/dL (ref 8–23)
CO2: 24 mmol/L (ref 22–32)
Calcium: 8.9 mg/dL (ref 8.9–10.3)
Chloride: 100 mmol/L (ref 98–111)
Creatinine: 0.55 mg/dL (ref 0.44–1.00)
GFR, Estimated: >60 mL/min (ref >60)
Glucose, Bld: 105 mg/dL — ABNORMAL HIGH (ref 70–99)
Potassium: 4 mmol/L (ref 3.5–5.1)
Sodium: 131 mmol/L — ABNORMAL LOW (ref 135–145)
Total Bilirubin: 1 mg/dL (ref 0.0–1.2)
Total Protein: 7 g/dL (ref 6.5–8.1)

## 2023-06-16 LAB — TYPE AND SCREEN
ABO/RH(D): A POS
Antibody Screen: NEGATIVE

## 2023-06-16 LAB — PREPARE RBC (CROSSMATCH)

## 2023-06-16 NOTE — Progress Notes (Signed)
 Spoke to patient by phone. Plt count 6. Recommend 2 units platelets. ER precautions for bleeding reviewed.

## 2023-06-18 ENCOUNTER — Inpatient Hospital Stay

## 2023-06-18 DIAGNOSIS — C9200 Acute myeloblastic leukemia, not having achieved remission: Secondary | ICD-10-CM

## 2023-06-18 DIAGNOSIS — D696 Thrombocytopenia, unspecified: Secondary | ICD-10-CM

## 2023-06-18 DIAGNOSIS — D649 Anemia, unspecified: Secondary | ICD-10-CM

## 2023-06-18 MED ORDER — SODIUM CHLORIDE 0.9% IV SOLUTION
250.0000 mL | INTRAVENOUS | Status: DC
Start: 2023-06-18 — End: 2023-06-18
  Administered 2023-06-18: 250 mL via INTRAVENOUS
  Filled 2023-06-18: qty 250

## 2023-06-19 LAB — PREPARE PLATELET PHERESIS
Unit division: 0
Unit division: 0

## 2023-06-19 LAB — BPAM PLATELET PHERESIS
Blood Product Expiration Date: 202503122359
Blood Product Unit Number: 202503122359
ISSUE DATE / TIME: 202503121153
PRODUCT CODE: 202503121249
PRODUCT CODE: 202503122359
Unit Type and Rh: 5100
Unit Type and Rh: 202503122359
Unit Type and Rh: 5100
Unit Type and Rh: 6200

## 2023-06-20 ENCOUNTER — Other Ambulatory Visit: Payer: Self-pay | Admitting: *Deleted

## 2023-06-20 ENCOUNTER — Other Ambulatory Visit: Payer: Self-pay

## 2023-06-20 DIAGNOSIS — D649 Anemia, unspecified: Secondary | ICD-10-CM

## 2023-06-20 DIAGNOSIS — C9200 Acute myeloblastic leukemia, not having achieved remission: Secondary | ICD-10-CM

## 2023-06-22 ENCOUNTER — Other Ambulatory Visit: Payer: Self-pay | Admitting: Oncology

## 2023-06-22 ENCOUNTER — Other Ambulatory Visit: Payer: Self-pay

## 2023-06-23 ENCOUNTER — Inpatient Hospital Stay

## 2023-06-23 ENCOUNTER — Other Ambulatory Visit: Payer: Self-pay

## 2023-06-23 ENCOUNTER — Encounter: Payer: Self-pay | Admitting: Oncology

## 2023-06-23 DIAGNOSIS — D649 Anemia, unspecified: Secondary | ICD-10-CM

## 2023-06-23 DIAGNOSIS — C9200 Acute myeloblastic leukemia, not having achieved remission: Secondary | ICD-10-CM

## 2023-06-23 LAB — CBC WITH DIFFERENTIAL (CANCER CENTER ONLY)
Abs Immature Granulocytes: 0.03 10*3/uL (ref 0.00–0.07)
Basophils Absolute: 0 10*3/uL (ref 0.0–0.1)
Basophils Relative: 1 %
Eosinophils Absolute: 0 10*3/uL (ref 0.0–0.5)
Eosinophils Relative: 1 %
HCT: 24.8 % — ABNORMAL LOW (ref 36.0–46.0)
Hemoglobin: 8.4 g/dL — ABNORMAL LOW (ref 12.0–15.0)
Immature Granulocytes: 2 %
Lymphocytes Relative: 55 %
Lymphs Abs: 0.9 10*3/uL (ref 0.7–4.0)
MCH: 28.7 pg (ref 26.0–34.0)
MCHC: 33.9 g/dL (ref 30.0–36.0)
MCV: 84.6 fL (ref 80.0–100.0)
Monocytes Absolute: 0.2 10*3/uL (ref 0.1–1.0)
Monocytes Relative: 10 %
Neutro Abs: 0.5 10*3/uL — ABNORMAL LOW (ref 1.7–7.7)
Neutrophils Relative %: 31 %
Platelet Count: 13 10*3/uL — ABNORMAL LOW (ref 150–400)
RBC: 2.93 MIL/uL — ABNORMAL LOW (ref 3.87–5.11)
RDW: 15.6 % — ABNORMAL HIGH (ref 11.5–15.5)
Smear Review: NORMAL
WBC Count: 1.6 10*3/uL — ABNORMAL LOW (ref 4.0–10.5)
nRBC: 2.4 % — ABNORMAL HIGH (ref 0.0–0.2)

## 2023-06-23 LAB — SAMPLE TO BLOOD BANK

## 2023-06-23 MED ORDER — PANTOPRAZOLE SODIUM 20 MG PO TBEC: 20.0000 mg | DELAYED_RELEASE_TABLET | Freq: Every day | ORAL | 0 refills | Status: DC

## 2023-06-23 NOTE — Progress Notes (Signed)
 Pt refused heparin flush on d/c of port access. Pt states allergy to heparin. Flushed with saline with good blood return.

## 2023-06-24 ENCOUNTER — Inpatient Hospital Stay

## 2023-06-25 ENCOUNTER — Encounter: Payer: Self-pay | Admitting: Oncology

## 2023-06-25 ENCOUNTER — Inpatient Hospital Stay

## 2023-06-25 ENCOUNTER — Telehealth: Payer: Self-pay

## 2023-06-25 ENCOUNTER — Other Ambulatory Visit: Payer: Self-pay

## 2023-06-25 DIAGNOSIS — C9200 Acute myeloblastic leukemia, not having achieved remission: Secondary | ICD-10-CM

## 2023-06-25 NOTE — Telephone Encounter (Signed)
 Hello Dr. Smith [name].  A message was received earlier today from the patient indicating she was at [facility] today, that she wouldn't need the  labs scheduled for tomorrow and requested labs be cancelled.  Patient had labs done earlier today Hgb 8.7, and PLT 25.  Please advise.

## 2023-06-25 NOTE — Telephone Encounter (Signed)
 See telephone note

## 2023-06-25 NOTE — Telephone Encounter (Signed)
 Cancel this weeks appt

## 2023-06-26 ENCOUNTER — Inpatient Hospital Stay

## 2023-06-26 ENCOUNTER — Encounter: Payer: Self-pay | Admitting: Oncology

## 2023-06-26 NOTE — Telephone Encounter (Signed)
 Per Dr Smith [name] "cbc with diff and hold tube or possible trnsfusion next Thursday labs Thursday. transfusion Friday" 07/03/23 - 07/04/23.  Patient scheduled, labs orders are in.

## 2023-06-27 ENCOUNTER — Other Ambulatory Visit: Payer: Self-pay

## 2023-06-27 ENCOUNTER — Inpatient Hospital Stay

## 2023-07-03 ENCOUNTER — Inpatient Hospital Stay

## 2023-07-03 ENCOUNTER — Other Ambulatory Visit: Payer: Self-pay | Admitting: Oncology

## 2023-07-03 ENCOUNTER — Telehealth: Payer: Self-pay | Admitting: *Deleted

## 2023-07-03 DIAGNOSIS — C9200 Acute myeloblastic leukemia, not having achieved remission: Secondary | ICD-10-CM

## 2023-07-03 NOTE — Telephone Encounter (Signed)
 this pt, called and said that she seen at [facility] at [facility] and She was told that Dr. Bradly Bienenstock will see if  [name] can get better xrays to see what can be done with her issue.she has AML. I called rao and she said this pt, called and said that she seen at [facility] at [facility] and She was told that Dr. Bradly Bienenstock will see if  [name] can get better xrays to see what can be done with her issue.  this pt, called and said that she seen at [facility] at [facility] and She was told that Dr. Bradly Bienenstock will see if  [name] can get better xrays to see what can be done with her issue.she has AML

## 2023-07-04 ENCOUNTER — Encounter: Payer: Self-pay | Admitting: Internal Medicine

## 2023-07-04 ENCOUNTER — Emergency Department

## 2023-07-04 ENCOUNTER — Inpatient Hospital Stay
Admission: EM | Admit: 2023-07-04 | Discharge: 2023-07-07 | DRG: 193 | Disposition: A | Discharge status: Home / Self Care | Attending: Internal Medicine | Admitting: Internal Medicine

## 2023-07-04 ENCOUNTER — Inpatient Hospital Stay

## 2023-07-04 ENCOUNTER — Other Ambulatory Visit: Payer: Self-pay

## 2023-07-04 ENCOUNTER — Other Ambulatory Visit: Payer: Self-pay | Admitting: Oncology

## 2023-07-04 DIAGNOSIS — Z880 Allergy status to penicillin: Secondary | ICD-10-CM

## 2023-07-04 DIAGNOSIS — D61810 Antineoplastic chemotherapy induced pancytopenia: Secondary | ICD-10-CM | POA: Diagnosis present

## 2023-07-04 DIAGNOSIS — J189 Pneumonia, unspecified organism: Secondary | ICD-10-CM | POA: Diagnosis not present

## 2023-07-04 DIAGNOSIS — I10 Essential (primary) hypertension: Secondary | ICD-10-CM | POA: Diagnosis present

## 2023-07-04 DIAGNOSIS — D61818 Other pancytopenia: Secondary | ICD-10-CM | POA: Diagnosis present

## 2023-07-04 DIAGNOSIS — J440 Chronic obstructive pulmonary disease with acute lower respiratory infection: Secondary | ICD-10-CM | POA: Diagnosis present

## 2023-07-04 DIAGNOSIS — R0602 Shortness of breath: Secondary | ICD-10-CM | POA: Diagnosis not present

## 2023-07-04 DIAGNOSIS — Z8249 Family history of ischemic heart disease and other diseases of the circulatory system: Secondary | ICD-10-CM

## 2023-07-04 DIAGNOSIS — J168 Pneumonia due to other specified infectious organisms: Secondary | ICD-10-CM | POA: Diagnosis not present

## 2023-07-04 DIAGNOSIS — K529 Noninfective gastroenteritis and colitis, unspecified: Secondary | ICD-10-CM | POA: Diagnosis present

## 2023-07-04 DIAGNOSIS — I2510 Atherosclerotic heart disease of native coronary artery without angina pectoris: Secondary | ICD-10-CM | POA: Diagnosis present

## 2023-07-04 DIAGNOSIS — A419 Sepsis, unspecified organism: Principal | ICD-10-CM

## 2023-07-04 DIAGNOSIS — M109 Gout, unspecified: Secondary | ICD-10-CM | POA: Diagnosis present

## 2023-07-04 DIAGNOSIS — Z79899 Other long term (current) drug therapy: Secondary | ICD-10-CM

## 2023-07-04 DIAGNOSIS — Z9221 Personal history of antineoplastic chemotherapy: Secondary | ICD-10-CM

## 2023-07-04 DIAGNOSIS — J441 Chronic obstructive pulmonary disease with (acute) exacerbation: Principal | ICD-10-CM

## 2023-07-04 DIAGNOSIS — C9200 Acute myeloblastic leukemia, not having achieved remission: Secondary | ICD-10-CM | POA: Diagnosis not present

## 2023-07-04 DIAGNOSIS — Z888 Allergy status to other drugs, medicaments and biological substances status: Secondary | ICD-10-CM

## 2023-07-04 DIAGNOSIS — F17210 Nicotine dependence, cigarettes, uncomplicated: Secondary | ICD-10-CM | POA: Diagnosis present

## 2023-07-04 DIAGNOSIS — R161 Splenomegaly, not elsewhere classified: Secondary | ICD-10-CM | POA: Diagnosis present

## 2023-07-04 DIAGNOSIS — Z1152 Encounter for screening for COVID-19: Secondary | ICD-10-CM

## 2023-07-04 DIAGNOSIS — Z885 Allergy status to narcotic agent status: Secondary | ICD-10-CM

## 2023-07-04 LAB — COMPREHENSIVE METABOLIC PANEL WITH GFR
ALT: 13 U/L (ref 0–44)
AST: 21 U/L (ref 15–41)
Albumin: 2.9 g/dL — ABNORMAL LOW (ref 3.5–5.0)
Alkaline Phosphatase: 111 U/L (ref 38–126)
Anion gap: 8 (ref 5–15)
BUN: 16 mg/dL (ref 8–23)
CO2: 23 mmol/L (ref 22–32)
Calcium: 8.5 mg/dL — ABNORMAL LOW (ref 8.9–10.3)
Chloride: 99 mmol/L (ref 98–111)
Creatinine, Ser: 0.58 mg/dL (ref 0.44–1.00)
GFR, Estimated: >60 mL/min (ref >60)
Glucose, Bld: 103 mg/dL — ABNORMAL HIGH (ref 70–99)
Potassium: 4 mmol/L (ref 3.5–5.1)
Sodium: 130 mmol/L — ABNORMAL LOW (ref 135–145)
Total Bilirubin: 0.9 mg/dL (ref 0.0–1.2)
Total Protein: 6.2 g/dL — ABNORMAL LOW (ref 6.5–8.1)

## 2023-07-04 LAB — CBC WITH DIFFERENTIAL/PLATELET
Abs Immature Granulocytes: 0.2 10*3/uL — ABNORMAL HIGH (ref 0.00–0.07)
Basophils Absolute: 0 10*3/uL (ref 0.0–0.1)
Basophils Relative: 0 %
Eosinophils Absolute: 0 10*3/uL (ref 0.0–0.5)
Eosinophils Relative: 0 %
HCT: 26.5 % — ABNORMAL LOW (ref 36.0–46.0)
Hemoglobin: 8.9 g/dL — ABNORMAL LOW (ref 12.0–15.0)
Lymphocytes Relative: 60 %
Lymphs Abs: 2.3 10*3/uL (ref 0.7–4.0)
MCH: 28.9 pg (ref 26.0–34.0)
MCHC: 33.6 g/dL (ref 30.0–36.0)
MCV: 86 fL (ref 80.0–100.0)
Monocytes Absolute: 0.6 10*3/uL (ref 0.1–1.0)
Monocytes Relative: 16 %
Myelocytes: 6 %
Neutro Abs: 0.7 10*3/uL — ABNORMAL LOW (ref 1.7–7.7)
Neutrophils Relative %: 18 %
Platelets: 60 10*3/uL — ABNORMAL LOW (ref 150–400)
RBC: 3.08 MIL/uL — ABNORMAL LOW (ref 3.87–5.11)
RDW: 20 % — ABNORMAL HIGH (ref 11.5–15.5)
Smear Review: NORMAL
WBC: 3.9 10*3/uL — ABNORMAL LOW (ref 4.0–10.5)
nRBC: 4.9 % — ABNORMAL HIGH (ref 0.0–0.2)

## 2023-07-04 LAB — APTT: aPTT: 32 s (ref 24–36)

## 2023-07-04 LAB — TROPONIN I (HIGH SENSITIVITY)
Troponin I (High Sensitivity): 9 ng/L (ref <18)
Troponin I (High Sensitivity): 8 ng/L (ref <18)

## 2023-07-04 LAB — LACTIC ACID, PLASMA: Lactic Acid, Venous: 1.1 mmol/L (ref 0.5–1.9)

## 2023-07-04 LAB — RESP PANEL BY RT-PCR (RSV, FLU A&B, COVID)  RVPGX2
Influenza A by PCR: NEGATIVE
Influenza B by PCR: NEGATIVE
Resp Syncytial Virus by PCR: NEGATIVE
SARS Coronavirus 2 by RT PCR: NEGATIVE

## 2023-07-04 LAB — PROCALCITONIN: Procalcitonin: 0.22 ng/mL

## 2023-07-04 LAB — PROTIME-INR
INR: 1.2 (ref 0.8–1.2)
Prothrombin Time: 15.5 s — ABNORMAL HIGH (ref 11.4–15.2)

## 2023-07-04 MED ORDER — SODIUM CHLORIDE 0.9 % IV SOLN
2.0000 g | Freq: Once | INTRAVENOUS | Status: AC
  Administered 2023-07-04: 2 g via INTRAVENOUS
  Filled 2023-07-04: qty 20

## 2023-07-04 MED ORDER — SODIUM CHLORIDE 0.9 % IV SOLN
2.0000 g | Freq: Two times a day (BID) | INTRAVENOUS | Status: DC
  Administered 2023-07-04 – 2023-07-07 (×6): 2 g via INTRAVENOUS
  Filled 2023-07-04 (×9): qty 12.5

## 2023-07-04 MED ORDER — ACETAMINOPHEN 325 MG PO TABS
650.0000 mg | ORAL_TABLET | Freq: Four times a day (QID) | ORAL | Status: DC | PRN
  Administered 2023-07-05 – 2023-07-07 (×5): 650 mg via ORAL
  Filled 2023-07-04 (×5): qty 2

## 2023-07-04 MED ORDER — SODIUM CHLORIDE 0.9 % IV SOLN
500.0000 mg | Freq: Once | INTRAVENOUS | Status: AC
  Administered 2023-07-04: 500 mg via INTRAVENOUS
  Filled 2023-07-04: qty 5

## 2023-07-04 MED ORDER — ALBUTEROL SULFATE (2.5 MG/3ML) 0.083% IN NEBU
2.5000 mg | INHALATION_SOLUTION | RESPIRATORY_TRACT | Status: DC | PRN
  Administered 2023-07-05 – 2023-07-06 (×3): 2.5 mg via RESPIRATORY_TRACT
  Filled 2023-07-04 (×3): qty 3

## 2023-07-04 MED ORDER — NICOTINE 21 MG/24HR TD PT24
21.0000 mg | MEDICATED_PATCH | Freq: Every day | TRANSDERMAL | Status: DC
  Administered 2023-07-04 – 2023-07-07 (×4): 21 mg via TRANSDERMAL
  Filled 2023-07-04 (×4): qty 1

## 2023-07-04 MED ORDER — ONDANSETRON HCL 4 MG/2ML IJ SOLN
4.0000 mg | Freq: Three times a day (TID) | INTRAMUSCULAR | Status: DC | PRN
  Administered 2023-07-05 – 2023-07-07 (×2): 4 mg via INTRAVENOUS
  Filled 2023-07-04 (×2): qty 2

## 2023-07-04 MED ORDER — DM-GUAIFENESIN ER 30-600 MG PO TB12
1.0000 | ORAL_TABLET | Freq: Two times a day (BID) | ORAL | Status: DC | PRN
  Administered 2023-07-07: 1 via ORAL
  Filled 2023-07-04: qty 1

## 2023-07-04 MED ORDER — VANCOMYCIN HCL 1500 MG/300ML IV SOLN
1500.0000 mg | Freq: Once | INTRAVENOUS | Status: AC
  Administered 2023-07-04: 1500 mg via INTRAVENOUS
  Filled 2023-07-04: qty 300

## 2023-07-04 MED ORDER — VANCOMYCIN HCL 1250 MG/250ML IV SOLN: 1250.0000 mg | INTRAVENOUS | Status: DC

## 2023-07-04 MED ORDER — IOHEXOL 350 MG/ML SOLN
75.0000 mL | Freq: Once | INTRAVENOUS | Status: AC | PRN
  Administered 2023-07-04: 75 mL via INTRAVENOUS

## 2023-07-04 MED ORDER — HYDRALAZINE HCL 20 MG/ML IJ SOLN: 5.0000 mg | INTRAMUSCULAR | Status: DC | PRN

## 2023-07-04 NOTE — H&P (Signed)
 History and Physical    Santrice Muzio Uber UJW:[id number] DOB: 07-Nov-1955 DOA: 07/04/2023  Referring MD/NP/PA:   PCP: Creig Hines, MD   Patient coming from:  The patient is coming from home.     Chief Complaint: SOB  HPI: ALEJANDRIA WESSELLS is a 68 y.o. 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  Patient has cough with little mucus production.  Initially she had fever in the first few days which has resolved.  Currently no fever or chills.  Patient also reports chest pain, which is located in the front chest, constant, sharp, moderate, radiating to her upper back, pleuritic, aggravated by deep breath and coughing.  Patient has been taking Augmentin and azithromycin without improvement.  No nausea, vomiting, diarrhea or abdominal pain.  No symptoms of UTI.  Data reviewed independently and ED Course: pt was found to have pancytopenia with WBC 3.9, hemoglobin 8.9, platelets 60 (patient had WBC 1.6, hemoglobin 8.4, platelets 30 on 06/23/2023), negative PCR for COVID, flu and RSV, negative RSV, troponin 9 --> 8, INR 1.1, procalcitonin 0.22.  Temperature normal, blood pressure 133/60, heart rate 107, 88, RR 18, oxygen saturation 97% on room air.  CTA negative for PE, did showed multifocal bilateral infiltration.  Patient is placed in the telemetry bed for obs.  CTA: 1. No evidence of pulmonary embolism. 2. Extensive patchy airspace opacities bilaterally, most pronounced within the mid to lower lung zones bilaterally and with a slightly peripheral-predominant distribution. Findings are most compatible with multifocal pneumonia, including atypical/viral etiologies. Short-term follow-up imaging after appropriate treatment is recommended to document resolution. 3. Mediastinal and bilateral hilar lymphadenopathy, which may  be reactive or related to patient's history of leukemia. 4. Splenomegaly. 5. Aortic and coronary artery atherosclerosis (ICD10-I70.0).   CXR: Multifocal bibasilar and lingular opacities, more pronounced on the left, concerning for multifocal pneumonia.   EKG: I have personally reviewed.  Sinus rhythm, QTc 459, LAE, poor R wave progression.   Review of Systems:   General: no fevers, chills, no body weight gain,  has fatigue HEENT: no blurry vision, hearing changes or sore throat Respiratory: has dyspnea, coughing, no wheezing CV: has chest pain, no palpitations GI: no nausea, vomiting, abdominal pain, diarrhea, constipation GU: no dysuria, burning on urination, increased urinary frequency, hematuria  Ext: no leg edema Neuro: no unilateral weakness, numbness, or tingling, no vision change or hearing loss Skin: no rash, no skin tear. MSK: No muscle spasm, no deformity, no limitation of range of movement in spin Heme: No easy bruising.  Travel history: No recent long distant travel.   Allergy:  Allergies  Allergen Reactions   Penicillin G Anaphylaxis   Prednisone Anaphylaxis    Pt states it turned her into a monster, lips started swelling and couldn't breath   Codeine    Heparin     Past Medical History:  Diagnosis Date   GERD (gastroesophageal reflux disease)    Leukemia (HCC)     Past Surgical History:  Procedure Laterality Date   CARDIAC SURGERY     CHOLECYSTECTOMY     HIP ARTHROSCOPY     IR IMAGING GUIDED PORT INSERTION  05/19/2023   surgery on left foot Left     Social History:  reports that she has been smoking cigarettes. She has a 1 pack-year smoking history. She has never used  smokeless tobacco. She reports that she does not currently use alcohol. She reports that she does not currently use drugs.  Family History:  Family History  Problem Relation Age of Onset   Cancer Mother    Heart failure Father    Heart failure Sister    Cancer Maternal  Grandmother    Cancer Maternal Grandfather      Prior to Admission medications   Medication Sig Start Date End Date Taking? Authorizing Provider  allopurinol (ZYLOPRIM) 300 MG tablet Take 1 tablet (300 mg total) by mouth daily. 05/08/23   Creig Hines, MD  ALPRAZolam Prudy Feeler) 0.5 MG tablet Take 0.5 mg by mouth at bedtime as needed for sleep.    [provider]  ascorbic acid (VITAMIN C) 500 MG tablet Take 500 mg by mouth daily.    [provider]  b complex vitamins capsule Take 1 capsule by mouth daily.    [provider]  cholecalciferol (VITAMIN D3) 10 MCG (400 UNIT) TABS tablet Take 400 Units by mouth.    [provider]  enalapril (VASOTEC) 20 MG tablet TAKE 1 TABLET BY MOUTH ONCE A DAY Patient not taking: Reported on 05/06/2023 05/24/21   Corky Downs, MD  hydroxyurea (HYDREA) 500 MG capsule Take 1,000 mg by mouth 2 (two) times daily. May take with food to minimize GI side effects.    [provider]  ibuprofen (ADVIL,MOTRIN) 600 MG tablet Take 1 tablet (600 mg total) by mouth every 6 (six) hours as needed. 01/15/18   Dionne Bucy, MD  levofloxacin (LEVAQUIN) 250 MG tablet Take 2 tablets (500 mg total) by mouth daily. 05/26/23   Creig Hines, MD  lidocaine-prilocaine (EMLA) cream Apply 1 Application topically as needed. 05/20/23   Creig Hines, MD  omeprazole (PRILOSEC) 20 MG capsule TAKE 1 CAPSULE BY MOUTH ONCE DAILY Patient not taking: Reported on 05/06/2023 01/01/22   Corky Downs, MD  ondansetron (ZOFRAN) 8 MG tablet Take 1 tablet (8 mg total) by mouth every 8 (eight) hours as needed for nausea or vomiting. 05/08/23   Creig Hines, MD  oxyCODONE-acetaminophen (PERCOCET) 5-325 MG tablet Take 1 tablet by mouth every 4 (four) hours as needed for severe pain (pain score 7-10). 05/17/23   Minna Antis, MD  pantoprazole (PROTONIX) 20 MG tablet Take 20 mg by mouth daily.    [provider]  pantoprazole (PROTONIX) 20 MG tablet  Take 1 tablet (20 mg total) by mouth daily. 06/23/23   Creig Hines, MD  polyethylene glycol (MIRALAX / GLYCOLAX) 17 g packet Take 17 g by mouth daily as needed for mild constipation.    [provider]  prochlorperazine (COMPAZINE) 10 MG tablet Take 1 tablet (10 mg total) by mouth every 6 (six) hours as needed for nausea or vomiting. 05/08/23   Creig Hines, MD  valACYclovir (VALTREX) 500 MG tablet Take 1 tablet (500 mg total) by mouth 2 (two) times daily. 05/12/23   Creig Hines, MD  venetoclax (VENCLEXTA) 100 MG tablet Take 4 tablets (400 mg total) by mouth once weekly on day 1, 8, 15, and 22 of each cycle. Take with a meal and water. Do not chew, crush, or break tablets. 04/01/23   [provider]    Physical Exam: Vitals:   07/04/23 1930 07/04/23 1945 07/04/23 2000 07/04/23 2120  BP: 135/63  (!) 147/66 129/68  Pulse: 82 84 87 91  Resp: (!) 21 16 18 20   Temp:  TempSrc:      SpO2: 96% 96% 95% 93%  Weight:      Height:       General: Not in acute distress HEENT:       Eyes: PERRL, EOMI, no jaundice       ENT: No discharge from the ears and nose, no pharynx injection, no tonsillar enlargement.        Neck: No JVD, no bruit, no mass felt. Heme: No neck lymph node enlargement. Cardiac: S1/S2, RRR, No murmurs, No gallops or rubs. Respiratory: No rales, wheezing, rhonchi or rubs. GI: Soft, nondistended, nontender, no rebound pain, BS present. GU: No hematuria Ext: No pitting leg edema bilaterally. 1+DP/PT pulse bilaterally. Musculoskeletal: No joint deformities, No joint redness or warmth, no limitation of ROM in spin. Skin: No rashes.  Neuro: Alert, oriented X3, cranial nerves II-XII grossly intact, moves all extremities normally.  Psych: Patient is not psychotic, no suicidal or hemocidal ideation.  Labs on Admission: I have personally reviewed following labs and imaging studies  CBC: Recent Labs  Lab 07/04/23 1543  WBC 3.9*  NEUTROABS 0.7*  HGB 8.9*   HCT 26.5*  MCV 86.0  PLT 60*   Basic Metabolic Panel: Recent Labs  Lab 07/04/23 1543  NA 130*  K 4.0  CL 99  CO2 23  GLUCOSE 103*  BUN 16  CREATININE 0.58  CALCIUM 8.5*   GFR: Estimated Creatinine Clearance: 59.7 mL/min (by C-G formula based on SCr of 0.58 mg/dL). Liver Function Tests: Recent Labs  Lab 07/04/23 1543  AST 21  ALT 13  ALKPHOS 111  BILITOT 0.9  PROT 6.2*  ALBUMIN 2.9*   No results for input(s): "LIPASE", "AMYLASE" in the last 168 hours. No results for input(s): "AMMONIA" in the last 168 hours. Coagulation Profile: Recent Labs  Lab 07/04/23 1543  INR 1.2   Cardiac Enzymes: No results for input(s): "CKTOTAL", "CKMB", "CKMBINDEX", "TROPONINI" in the last 168 hours. BNP (last 3 results) No results for input(s): "PROBNP" in the last 8760 hours. HbA1C: No results for input(s): "HGBA1C" in the last 72 hours. CBG: No results for input(s): "GLUCAP" in the last 168 hours. Lipid Profile: No results for input(s): "CHOL", "HDL", "LDLCALC", "TRIG", "CHOLHDL", "LDLDIRECT" in the last 72 hours. Thyroid Function Tests: No results for input(s): "TSH", "T4TOTAL", "FREET4", "T3FREE", "THYROIDAB" in the last 72 hours. Anemia Panel: No results for input(s): "VITAMINB12", "FOLATE", "FERRITIN", "TIBC", "IRON", "RETICCTPCT" in the last 72 hours. Urine analysis:    Component Value Date/Time   COLORURINE YELLOW (A) 05/17/2023 1633   APPEARANCEUR CLEAR (A) 05/17/2023 1633   LABSPEC >1.046 (H) 05/17/2023 1633   PHURINE 5.0 05/17/2023 1633   GLUCOSEU NEGATIVE 05/17/2023 1633   HGBUR MODERATE (A) 05/17/2023 1633   BILIRUBINUR NEGATIVE 05/17/2023 1633   KETONESUR NEGATIVE 05/17/2023 1633   PROTEINUR NEGATIVE 05/17/2023 1633   UROBILINOGEN 0.2 09/04/2008 2146   NITRITE NEGATIVE 05/17/2023 1633   LEUKOCYTESUR NEGATIVE 05/17/2023 1633   Sepsis Labs: @LABRCNTIP (procalcitonin:4,lacticidven:4) ) Recent Results (from the past 240 hours)  Resp panel by RT-PCR (RSV, Flu  A&B, Covid) Anterior Nasal Swab     Status: None   Collection Time: 07/04/23  3:41 PM   Specimen: Anterior Nasal Swab  Result Value Ref Range Status   SARS Coronavirus 2 by RT PCR NEGATIVE NEGATIVE Final    Comment: (NOTE) SARS-CoV-2 target nucleic acids are NOT DETECTED.  The SARS-CoV-2 RNA is generally detectable in upper respiratory specimens during the acute phase of infection. The lowest concentration of  SARS-CoV-2 viral copies this assay can detect is 138 copies/mL. A negative result does not preclude SARS-Cov-2 infection and should not be used as the sole basis for treatment or other patient management decisions. A negative result may occur with  improper specimen collection/handling, submission of specimen other than nasopharyngeal swab, presence of viral mutation(s) within the areas targeted by this assay, and inadequate number of viral copies(<138 copies/mL). A negative result must be combined with clinical observations, patient history, and epidemiological information. The expected result is Negative.  Fact Sheet for Patients:  BloggerCourse.com  Fact Sheet for Healthcare Providers:  SeriousBroker.it  This test is no t yet approved or cleared by the [place] FDA and  has been authorized for detection and/or diagnosis of SARS-CoV-2 by FDA under an Emergency Use Authorization (EUA). This EUA will remain  in effect (meaning this test can be used) for the duration of the COVID-19 declaration under Section 564(b)(1) of the Act, 21 U.S.C.section 360bbb-3(b)(1), unless the authorization is terminated  or revoked sooner.       Influenza A by PCR NEGATIVE NEGATIVE Final   Influenza B by PCR NEGATIVE NEGATIVE Final    Comment: (NOTE) The Xpert Xpress SARS-CoV-2/FLU/RSV plus assay is intended as an aid in the diagnosis of influenza from Nasopharyngeal swab specimens and should not be used as a sole basis for treatment.  Nasal washings and aspirates are unacceptable for Xpert Xpress SARS-CoV-2/FLU/RSV testing.  Fact Sheet for Patients: BloggerCourse.com  Fact Sheet for Healthcare Providers: SeriousBroker.it  This test is not yet approved or cleared by the [place] FDA and has been authorized for detection and/or diagnosis of SARS-CoV-2 by FDA under an Emergency Use Authorization (EUA). This EUA will remain in effect (meaning this test can be used) for the duration of the COVID-19 declaration under Section 564(b)(1) of the Act, 21 U.S.C. section 360bbb-3(b)(1), unless the authorization is terminated or revoked.     Resp Syncytial Virus by PCR NEGATIVE NEGATIVE Final    Comment: (NOTE) Fact Sheet for Patients: BloggerCourse.com  Fact Sheet for Healthcare Providers: SeriousBroker.it  This test is not yet approved or cleared by the [place] FDA and has been authorized for detection and/or diagnosis of SARS-CoV-2 by FDA under an Emergency Use Authorization (EUA). This EUA will remain in effect (meaning this test can be used) for the duration of the COVID-19 declaration under Section 564(b)(1) of the Act, 21 U.S.C. section 360bbb-3(b)(1), unless the authorization is terminated or revoked.  Performed at [facility], [address] [place], [place] [address]   Respiratory (~20 pathogens) panel by PCR     Status: None   Collection Time: 07/04/23  7:45 PM   Specimen: Nasopharyngeal Swab; Respiratory  Result Value Ref Range Status   Adenovirus NOT DETECTED NOT DETECTED Final   Coronavirus 229E NOT DETECTED NOT DETECTED Final    Comment: (NOTE) The Coronavirus on the Respiratory Panel, DOES NOT test for the novel  Coronavirus (2019 nCoV)    Coronavirus HKU1 NOT DETECTED NOT DETECTED Final   Coronavirus NL63 NOT DETECTED NOT DETECTED Final   Coronavirus OC43 NOT DETECTED NOT  DETECTED Final   Metapneumovirus NOT DETECTED NOT DETECTED Final   Rhinovirus / Enterovirus NOT DETECTED NOT DETECTED Final   Influenza A NOT DETECTED NOT DETECTED Final   Influenza B NOT DETECTED NOT DETECTED Final   Parainfluenza Virus 1 NOT DETECTED NOT DETECTED Final   Parainfluenza Virus 2 NOT DETECTED NOT DETECTED Final   Parainfluenza Virus 3 NOT DETECTED  NOT DETECTED Final   Parainfluenza Virus 4 NOT DETECTED NOT DETECTED Final   Respiratory Syncytial Virus NOT DETECTED NOT DETECTED Final   Bordetella pertussis NOT DETECTED NOT DETECTED Final   Bordetella Parapertussis NOT DETECTED NOT DETECTED Final   Chlamydophila pneumoniae NOT DETECTED NOT DETECTED Final   Mycoplasma pneumoniae NOT DETECTED NOT DETECTED Final    Comment: Performed at [facility], [address], [place], [place] [address]     Radiological Exams on Admission:   Assessment/Plan Principal Problem:   Multifocal pneumonia Active Problems:   Acute myeloid leukemia not having achieved remission (HCC)   Pancytopenia (HCC)   Gout   Assessment and Plan:  Multifocal pneumonia: Patient does not have fever, lactic acid normal 1.1, clinically does not seem to have sepsis.  No oxygen desaturation.  Patient failed outpatient Augmentin and azithromycin treatment.  Patient is on chemotherapy, immunosuppressed, will need antibiotics with broad coverage.  - Place in tele bed for obs - IV Vancomycin and cefepime (pt received one dose of Rocephin and azithromycin in ED). - Incentive spirometry - Mucinex for cough  - Bronchodilators - Urine legionella and S. pneumococcal antigen - Follow up blood culture x2, sputum culture  Acute myeloid leukemia not having achieved remission [facility]): On chemotherapy, last dose was a week ago per patient. -f/u with oncology in [facility]  Pancytopenia [facility]): Hemoglobin stable. -Follow-up CBC  Gout: Patient is not taking allopurinol currently. -Observe closely     DVT ppx:  SCD  Code Status: Full code    Family Communication:     not done, no family member is at bed side.     Disposition Plan:  Anticipate discharge back to previous environment  Consults called:  none  Admission status and Level of care: Telemetry Medical:    for obs     Dispo: The patient is from: Home              Anticipated d/c is to: Home              Anticipated d/c date is: 1 day              Patient currently is not medically stable to d/c.    Severity of Illness:  The appropriate patient status for this patient is OBSERVATION. Observation status is judged to be reasonable and necessary in order to provide the required intensity of service to ensure the patient's safety. The patient's presenting symptoms, physical exam findings, and initial radiographic and laboratory data in the context of their medical condition is felt to place them at decreased risk for further clinical deterioration. Furthermore, it is anticipated that the patient will be medically stable for discharge from the hospital within 2 midnights of admission.        Date of Service 07/05/2023    Lorretta Harp [facility]   If 7PM-7AM, please contact night-coverage [email or web address] 07/05/2023, 1:14 AM

## 2023-07-04 NOTE — ED Provider Notes (Signed)
 [facility] Provider Note    Event Date/Time   First MD Initiated Contact with Patient 07/04/23 1500     (approximate)   History   Chief Complaint Shortness of Breath   HPI  [name] is a 68 y.o. female with past medical history of leukemia and GERD who presents to the ED complaining of shortness of breath.  Patient reports that for the past 5 days she has been dealing with a cough productive of yellowish sputum along with increasing shortness of breath.  Patient reports she becomes dyspneic with any exertion, did have fevers the first couple days of illness, however those have since resolved.  Shortness of breath has been worsening and she reports coughing up small amounts of blood over the past 24 hours.  She has not noticed any pain or swelling in her legs, is currently receiving chemotherapy for AML and follows at [facility] as well as here at [facility].  She was started on Augmentin and azithromycin earlier this week for possible pneumonia, was supposed to get an x-ray as an outpatient but this did not happen.  She presents today due to worsening difficulty breathing.     Physical Exam   Triage Vital Signs: ED Triage Vitals  Encounter Vitals Group     BP 07/04/23 1426 124/69     Systolic BP Percentile --      Diastolic BP Percentile --      Pulse Rate 07/04/23 1426 (!) 107     Resp 07/04/23 1426 16     Temp 07/04/23 1426 98.2 F (36.8 C)     Temp Source 07/04/23 1426 Oral     SpO2 07/04/23 1426 93 %     Weight 07/04/23 1424 158 lb 4.6 oz (71.8 kg)     Height --      Head Circumference --      Peak Flow --      Pain Score 07/04/23 1424 0     Pain Loc --      Pain Education --      Exclude from Growth Chart --     Most recent vital signs: Vitals:   07/04/23 1600 07/04/23 1630  BP: 115/71 133/60  Pulse: 92 88  Resp: 14 18  Temp:    SpO2: 97% 94%    Constitutional: Alert and oriented. Eyes: Conjunctivae are normal. Head: Atraumatic. Nose:  No congestion/rhinnorhea. Mouth/Throat: Mucous membranes are moist.  Cardiovascular: Tachycardic, regular rhythm. Grossly normal heart sounds.  2+ radial pulses bilaterally. Respiratory: Normal respiratory effort.  No retractions. Lungs CTAB. Gastrointestinal: Soft and nontender. No distention. Musculoskeletal: No lower extremity tenderness nor edema.  Neurologic:  Normal speech and language. No gross focal neurologic deficits are appreciated.    ED Results / Procedures / Treatments   Labs (all labs ordered are listed, but only abnormal results are displayed) Labs Reviewed  CBC WITH DIFFERENTIAL/PLATELET - Abnormal; Notable for the following components:      Result Value   WBC 3.9 (*)    RBC 3.08 (*)    Hemoglobin 8.9 (*)    HCT 26.5 (*)    RDW 20.0 (*)    Platelets 60 (*)    nRBC 4.9 (*)    Neutro Abs 0.7 (*)    Abs Immature Granulocytes 0.20 (*)    All other components within normal limits  COMPREHENSIVE METABOLIC PANEL WITH GFR - Abnormal; Notable for the following components:   Sodium 130 (*)    Glucose,  Bld 103 (*)    Calcium 8.5 (*)    Total Protein 6.2 (*)    Albumin 2.9 (*)    All other components within normal limits  RESP PANEL BY RT-PCR (RSV, FLU A&B, COVID)  [id number]  CULTURE, BLOOD (ROUTINE X 2)  CULTURE, BLOOD (ROUTINE X 2)  LACTIC ACID, PLASMA  PROCALCITONIN  PROTIME-INR  APTT  TYPE AND SCREEN  TROPONIN I (HIGH SENSITIVITY)  TROPONIN I (HIGH SENSITIVITY)     EKG  ED ECG REPORT I, Chesley Noon, the attending physician, personally viewed and interpreted this ECG.   Date: 07/04/2023  EKG Time: 14:29  Rate: 106  Rhythm: sinus tachycardia  Axis: RAD  Intervals:none  ST&T Change: None  RADIOLOGY Chest x-ray reviewed and interpreted by me with left lower lobe infiltrate concerning for pneumonia.  PROCEDURES:  Critical Care performed: No  Procedures   MEDICATIONS ORDERED IN ED: Medications  albuterol (VENTOLIN HFA) 108 (90 Base) MCG/ACT  inhaler 2 puff (has no administration in time range)  dextromethorphan-guaiFENesin (MUCINEX DM) 30-600 MG per 12 hr tablet 1 tablet (has no administration in time range)  ondansetron (ZOFRAN) injection 4 mg (has no administration in time range)  hydrALAZINE (APRESOLINE) injection 5 mg (has no administration in time range)  acetaminophen (TYLENOL) tablet 650 mg (has no administration in time range)  nicotine (NICODERM CQ - dosed in mg/24 hours) patch 21 mg (has no administration in time range)  cefTRIAXone (ROCEPHIN) 2 g in sodium chloride 0.9 % 100 mL IVPB (0 g Intravenous Stopped 07/04/23 1646)  azithromycin (ZITHROMAX) 500 mg in sodium chloride 0.9 % 250 mL IVPB (500 mg Intravenous New Bag/Given 07/04/23 1648)  iohexol (OMNIPAQUE) 350 MG/ML injection 75 mL (75 mLs Intravenous Contrast Given 07/04/23 1709)     IMPRESSION / MDM / ASSESSMENT AND PLAN / ED COURSE  I reviewed the triage vital signs and the nursing notes.                              68 y.o. female with past medical history of AML and GERD who presents to the ED complaining of productive cough with increasing difficulty breathing and now 24 hours of hemoptysis.  Patient's presentation is most consistent with acute presentation with potential threat to life or bodily function.  Differential diagnosis includes, but is not limited to, sepsis, pneumonia, ACS, PE, pneumothorax, anemia, electrolyte abnormality, AKI.  Patient nontoxic-appearing and in no acute distress, vital signs initially remarkable for tachycardia, now improved without intervention.  She is immunocompromise given chemotherapy for leukemia, we will draw blood cultures and further assess with chest x-ray.  Lab results are pending at this time, given hemoptysis and worsening difficulty breathing, will also check CTA chest to rule out PE.  CTA chest is negative for PE, does show extensive pneumonia and we will treat with IV antibiotics.  Labs with mild leukopenia which is  improved compared to previous, anemia and thrombocytopenia are also stable.  Troponin within normal limits, lactic acid and procalcitonin are also reassuring.  No significant electrolyte abnormality or AKI noted, LFTs are also unremarkable.  Case discussed with hospitalist for admission.      FINAL CLINICAL IMPRESSION(S) / ED DIAGNOSES   Final diagnoses:  Sepsis without acute organ dysfunction, due to unspecified organism (HCC)  Pneumonia of both lower lobes due to infectious organism  Acute myeloid leukemia not having achieved remission (HCC)     Rx / DC Orders  ED Discharge Orders     None        Note:  This document was prepared using Dragon voice recognition software and may include unintentional dictation errors.   Chesley Noon, MD 07/04/23 [id number]

## 2023-07-04 NOTE — Consult Note (Signed)
 CODE SEPSIS - PHARMACY COMMUNICATION  **Broad-spectrum antimicrobials should be administered within one hour of sepsis diagnosis**  Time Code Sepsis call or page was received: 1609  Antibiotics ordered: Ceftriaxone, Azithromycin  Time of first antibiotic administration: 1616  Additional action taken by pharmacy: N/A  If necessary, name of provider/nurse contacted: N/A    Will M. Dareen Piano, PharmD Clinical Pharmacist 07/04/2023 5:29 PM

## 2023-07-04 NOTE — Telephone Encounter (Signed)
 I reached out to the patient to determine if she went to the [facility] ER yesterday as we did not see any ED visits on her chart nor xrays. Pt stated that she didn't go and she wanted to [facility]. Her oxygen sats are 87%. She is having worsening shortness of breath. She stated that she didn't want to go to the [facility] ER b/c the last time she went to the ER, the [facility] dept kept her for 48 hours. I advised that Dr. Smith [name] was recommending the [facility] ER. Pt stated that she would not go to [facility] and she would go to the [facility] ER. She was leaving now to head to the [facility] ER now.

## 2023-07-04 NOTE — ED Notes (Signed)
 Pt in CT at this time.

## 2023-07-04 NOTE — ED Notes (Signed)
 Pt to Xray at this time

## 2023-07-04 NOTE — Progress Notes (Signed)
 Pharmacy Antibiotic Note  [name] is a 68 y.o. female admitted on 07/04/2023 with pneumonia.  Pharmacy has been consulted for cefepime and vancomycin dosing.  -Patient diagnosed with pneumonia on Monday 3/24, taking antibiotics PTA, augmentin and azithromycin.  -Hx leukemia, currently receiving chemotherapy for AML  -Chest CT:  Extensive patchy airspace opacities bilaterally, most pronounced within the mid to lower lung zones bilaterally and with a slightly peripheral-predominant distribution. Findings are most compatible with multifocal pneumonia, including atypical/viral etiologies.  Plan: -Will order Cefepime 2 gm IV q12h   -Will order Vancomycin 1586m gIV x 1 for loading dose Will continue with Vancomycin 1250 mg IV Q 24 hrs. Goal AUC 400-550. Expected AUC: 499 SCr used: 0.80 (actual 0.58)  Cmin 10.6  MRSA PCR pending  Follow up renal fxn, cultures, length of therapy, etc    Height: 5\' 5"  (165.1 cm) Weight: 67.1 kg (148 lb) IBW/kg (Calculated) : 57  Temp (24hrs), Avg:98.2 F (36.8 C), Min:98.1 F (36.7 C), Max:98.2 F (36.8 C)  Recent Labs  Lab 07/04/23 1543  WBC 3.9*  CREATININE 0.58  LATICACIDVEN 1.1    Estimated Creatinine Clearance: 59.7 mL/min (by C-G formula based on SCr of 0.58 mg/dL).    Allergies  Allergen Reactions   Prednisone Anaphylaxis    Pt states it turned her into a monster, lips started swelling and couldn't breath   Amoxicillin    Codeine    Heparin     Antimicrobials this admission: Azithromycin 3/28 x 1  Ceftriaxone 3/28 x 1  Cefepime 3/28 >>   Vancomycin 3/38 >>  Dose adjustments this admission:    Microbiology results: 3/28 BCx: pending   UCx:    3/28 Sputum: pending  3/28 MRSA PCR: pending  Thank you for allowing pharmacy to be a part of this patient's care.  Aaliyha Mumford A 07/04/2023 8:35 PM

## 2023-07-04 NOTE — Sepsis Progress Note (Signed)
 eLink is following this Code Sepsis.

## 2023-07-04 NOTE — ED Triage Notes (Signed)
 C.O SOB/ Chest pain.  Patient diagnosed with pneumonia on Monday, taking antibiotics. STates symptoms not improving. Sats at home have been dropping into the 80's per patient.  AAOx3.  Skin warm and dry. NAD. No SOB/ DOE

## 2023-07-05 ENCOUNTER — Other Ambulatory Visit: Payer: Self-pay

## 2023-07-05 ENCOUNTER — Encounter: Payer: Self-pay | Admitting: Internal Medicine

## 2023-07-05 DIAGNOSIS — J440 Chronic obstructive pulmonary disease with acute lower respiratory infection: Secondary | ICD-10-CM | POA: Diagnosis present

## 2023-07-05 DIAGNOSIS — Z885 Allergy status to narcotic agent status: Secondary | ICD-10-CM | POA: Diagnosis not present

## 2023-07-05 DIAGNOSIS — J189 Pneumonia, unspecified organism: Secondary | ICD-10-CM | POA: Diagnosis not present

## 2023-07-05 DIAGNOSIS — K529 Noninfective gastroenteritis and colitis, unspecified: Secondary | ICD-10-CM | POA: Diagnosis present

## 2023-07-05 DIAGNOSIS — Z1152 Encounter for screening for COVID-19: Secondary | ICD-10-CM | POA: Diagnosis not present

## 2023-07-05 DIAGNOSIS — Z8249 Family history of ischemic heart disease and other diseases of the circulatory system: Secondary | ICD-10-CM | POA: Diagnosis not present

## 2023-07-05 DIAGNOSIS — J441 Chronic obstructive pulmonary disease with (acute) exacerbation: Secondary | ICD-10-CM | POA: Diagnosis present

## 2023-07-05 DIAGNOSIS — R161 Splenomegaly, not elsewhere classified: Secondary | ICD-10-CM | POA: Diagnosis present

## 2023-07-05 DIAGNOSIS — I10 Essential (primary) hypertension: Secondary | ICD-10-CM | POA: Diagnosis present

## 2023-07-05 DIAGNOSIS — C9200 Acute myeloblastic leukemia, not having achieved remission: Secondary | ICD-10-CM | POA: Diagnosis present

## 2023-07-05 DIAGNOSIS — I2510 Atherosclerotic heart disease of native coronary artery without angina pectoris: Secondary | ICD-10-CM | POA: Diagnosis present

## 2023-07-05 DIAGNOSIS — M109 Gout, unspecified: Secondary | ICD-10-CM | POA: Diagnosis present

## 2023-07-05 DIAGNOSIS — Z79899 Other long term (current) drug therapy: Secondary | ICD-10-CM | POA: Diagnosis not present

## 2023-07-05 DIAGNOSIS — D61810 Antineoplastic chemotherapy induced pancytopenia: Secondary | ICD-10-CM | POA: Diagnosis present

## 2023-07-05 DIAGNOSIS — J168 Pneumonia due to other specified infectious organisms: Secondary | ICD-10-CM | POA: Diagnosis present

## 2023-07-05 DIAGNOSIS — Z9221 Personal history of antineoplastic chemotherapy: Secondary | ICD-10-CM | POA: Diagnosis not present

## 2023-07-05 DIAGNOSIS — R0602 Shortness of breath: Secondary | ICD-10-CM | POA: Diagnosis present

## 2023-07-05 DIAGNOSIS — Z880 Allergy status to penicillin: Secondary | ICD-10-CM | POA: Diagnosis not present

## 2023-07-05 DIAGNOSIS — Z888 Allergy status to other drugs, medicaments and biological substances status: Secondary | ICD-10-CM | POA: Diagnosis not present

## 2023-07-05 DIAGNOSIS — F17210 Nicotine dependence, cigarettes, uncomplicated: Secondary | ICD-10-CM | POA: Diagnosis present

## 2023-07-05 LAB — RESPIRATORY PANEL BY PCR

## 2023-07-05 LAB — BASIC METABOLIC PANEL WITH GFR
Anion gap: 9 (ref 5–15)
BUN: 13 mg/dL (ref 8–23)
CO2: 18 mmol/L — ABNORMAL LOW (ref 22–32)
Calcium: 8.2 mg/dL — ABNORMAL LOW (ref 8.9–10.3)
Chloride: 102 mmol/L (ref 98–111)
Creatinine, Ser: 0.47 mg/dL (ref 0.44–1.00)
GFR, Estimated: >60 mL/min (ref >60)
Glucose, Bld: 100 mg/dL — ABNORMAL HIGH (ref 70–99)
Potassium: 4 mmol/L (ref 3.5–5.1)
Sodium: 129 mmol/L — ABNORMAL LOW (ref 135–145)

## 2023-07-05 LAB — CBC
HCT: 26.5 % — ABNORMAL LOW (ref 36.0–46.0)
Hemoglobin: 8.8 g/dL — ABNORMAL LOW (ref 12.0–15.0)
MCH: 28.9 pg (ref 26.0–34.0)
MCHC: 33.2 g/dL (ref 30.0–36.0)
MCV: 87.2 fL (ref 80.0–100.0)
Platelets: 49 10*3/uL — ABNORMAL LOW (ref 150–400)
RBC: 3.04 MIL/uL — ABNORMAL LOW (ref 3.87–5.11)
RDW: 19.9 % — ABNORMAL HIGH (ref 11.5–15.5)
WBC: 3.4 10*3/uL — ABNORMAL LOW (ref 4.0–10.5)
nRBC: 2.7 % — ABNORMAL HIGH (ref 0.0–0.2)

## 2023-07-05 LAB — TYPE AND SCREEN
ABO/RH(D): A POS
Antibody Screen: NEGATIVE

## 2023-07-05 LAB — HIV ANTIBODY (ROUTINE TESTING W REFLEX): HIV Screen 4th Generation wRfx: NONREACTIVE

## 2023-07-05 LAB — MRSA NEXT GEN BY PCR, NASAL: MRSA by PCR Next Gen: NOT DETECTED

## 2023-07-05 MED ORDER — ALPRAZOLAM 0.5 MG PO TABS
0.5000 mg | ORAL_TABLET | Freq: Every evening | ORAL | Status: DC | PRN
  Administered 2023-07-05 – 2023-07-06 (×3): 0.5 mg via ORAL
  Filled 2023-07-05 (×3): qty 1

## 2023-07-05 MED ORDER — GUAIFENESIN-DM 100-10 MG/5ML PO SYRP
5.0000 mL | ORAL_SOLUTION | ORAL | Status: DC | PRN
  Administered 2023-07-05 – 2023-07-06 (×2): 5 mL via ORAL
  Filled 2023-07-05 (×2): qty 10

## 2023-07-05 MED ORDER — VITAMIN C 500 MG PO TABS
500.0000 mg | ORAL_TABLET | Freq: Every day | ORAL | Status: DC
  Administered 2023-07-05 – 2023-07-07 (×3): 500 mg via ORAL
  Filled 2023-07-05 (×3): qty 1

## 2023-07-05 MED ORDER — POLYETHYLENE GLYCOL 3350 17 G PO PACK
17.0000 g | PACK | Freq: Every day | ORAL | Status: DC | PRN
  Filled 2023-07-05: qty 1

## 2023-07-05 MED ORDER — VITAMIN D 25 MCG (1000 UNIT) PO TABS
500.0000 [IU] | ORAL_TABLET | Freq: Every day | ORAL | Status: DC
  Administered 2023-07-05 – 2023-07-07 (×3): 500 [IU] via ORAL
  Filled 2023-07-05 (×3): qty 1

## 2023-07-05 MED ORDER — OXYCODONE-ACETAMINOPHEN 5-325 MG PO TABS: 1.0000 | ORAL_TABLET | ORAL | Status: DC | PRN

## 2023-07-05 MED ORDER — RENA-VITE PO TABS
1.0000 | ORAL_TABLET | Freq: Every day | ORAL | Status: DC
  Administered 2023-07-05 – 2023-07-07 (×3): 1 via ORAL
  Filled 2023-07-05 (×3): qty 1

## 2023-07-05 MED ORDER — PANTOPRAZOLE SODIUM 20 MG PO TBEC
20.0000 mg | DELAYED_RELEASE_TABLET | Freq: Every day | ORAL | Status: DC
  Administered 2023-07-05 – 2023-07-07 (×3): 20 mg via ORAL
  Filled 2023-07-05 (×3): qty 1

## 2023-07-05 NOTE — Plan of Care (Signed)

## 2023-07-05 NOTE — ED Notes (Signed)
 Patient requesting medications for back discomfort/pain, anxiety/sleep, and nausea.  Reports "no one brought me my Xanax last night.  He said he was going to order it.  I don't know why no one brought it."  Pt informed that medication was ordered as a PRN and she would need to ask for medication.  Patient reports she has not asked anyone for medication prior to 0500 this morning.  Informed and educated patient about PRN orders and  made her aware that she would need to ask for medication if she needed it.

## 2023-07-05 NOTE — Progress Notes (Signed)
 Progress Note   Patient: [name]:[id number] DOB: 06-27-1955 DOA: 07/04/2023     0 DOS: the patient was seen and examined on 07/05/2023   Brief hospital course:  ELICE CRIGGER is a 68 y.o. 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  Patient has cough with little mucus production.  Initially she had fever in the first few days which has resolved.  Currently no fever or chills.  Patient also reports chest pain, which is located in the front chest, constant, sharp, moderate, radiating to her upper back, pleuritic, aggravated by deep breath and coughing.  Patient has been taking Augmentin and azithromycin without improvement.  No nausea, vomiting, diarrhea or abdominal pain.  No symptoms of UTI.   Data reviewed independently and ED Course: pt was found to have pancytopenia with WBC 3.9, hemoglobin 8.9, platelets 60 (patient had WBC 1.6, hemoglobin 8.4, platelets 30 on 06/23/2023), negative PCR for COVID, flu and RSV, negative RSV, troponin 9 --> 8, INR 1.1, procalcitonin 0.22.  Temperature normal, blood pressure 133/60, heart rate 107, 88, RR 18, oxygen saturation 97% on room air.  CTA negative for PE, did showed multifocal bilateral infiltration.  Patient is placed in the telemetry bed for obs.  The patient was admitted to inpatient status. She was placed on Cefepime and Vancomycin. However. The patietn subsequently has tested negative for MRSA. Respiratory panel by PCR is negative. Blood cultures x 2 have been obtained, but have had no growth.  Assessment and Plan: Multifocal pneumonia: Patient does not have fever, lactic acid normal 1.1, clinically does not seem to have sepsis.  No oxygen desaturation.  Patient failed outpatient Augmentin and azithromycin treatment.  Patient is on chemotherapy, immunosuppressed,  will need antibiotics with broad coverage.   - Place in tele bed for obs -The patient is receiving IV cefepime. She received one dose of Rocephin and azithromycin in the ED.  - Incentive spirometry - Mucinex for cough  - Bronchodilators - Urine legionella and S. pneumococcal antigen are pending. - Follow up blood culture x2, sputum culture - there is no growth.   Acute myeloid leukemia not having achieved remission [facility]): On chemotherapy, last dose was a week ago per patient. -f/u with oncology in [facility]   Pancytopenia [facility]): Hemoglobin stable. -Follow-up CBC   Gout: Patient is not taking allopurinol currently. -Observe closely      Subjective: The patient is awake, alert, and oriented x 3. No new complaints.   Physical Exam: Vitals:   07/05/23 1100 07/05/23 1230 07/05/23 1341 07/05/23 1643  BP: (!) 119/59  (!) 126/54 131/66  Pulse: 79 83 88 87  Resp: (!) 23 (!) 22 20 16   Temp:   (!) 97.5 F (36.4 C) 97.7 F (36.5 C)  TempSrc:    Oral  SpO2: 98% 97% 98% 97%  Weight:      Height:       Exam:  Constitutional:  The patient is awake, alert, and oriented x 3. No acute distress. Respiratory:  No increased work of breathing. Positive for scattered rales. No wheezes or rhonchi auscultated. No tactile fremitus Cardiovascular:  Regular rate and rhythm No murmurs, ectopy, or gallups. No lateral PMI. No thrills. Abdomen:  Abdomen is soft, non-tender, non-distended No hernias, masses, or organomegaly Normoactive bowel sounds.  Musculoskeletal:  No cyanosis, clubbing, or edema  Skin:  No rashes, lesions, ulcers palpation of skin: no induration or nodules Neurologic:  CN 2-12 intact Sensation all 4 extremities intact Psychiatric:  Mental status Mood, affect appropriate Orientation to person, place, time  judgment and insight appear intact  Family Communication: None available  Disposition: Status is: Inpatient Remains inpatient appropriate because: Patient is at  high risk of decompensation and worsening of her respiratory status due to her cancer and her immunosuppressed state.  Planned Discharge Destination: Home    Time spent: 34 minutes  Author: Hisako Bugh, DO 07/05/2023 5:57 PM  For on call review [email or web address].

## 2023-07-06 DIAGNOSIS — J189 Pneumonia, unspecified organism: Secondary | ICD-10-CM | POA: Diagnosis not present

## 2023-07-06 LAB — CBC
HCT: 26.3 % — ABNORMAL LOW (ref 36.0–46.0)
Hemoglobin: 8.8 g/dL — ABNORMAL LOW (ref 12.0–15.0)
MCH: 29.1 pg (ref 26.0–34.0)
MCHC: 33.5 g/dL (ref 30.0–36.0)
MCV: 87.1 fL (ref 80.0–100.0)
Platelets: 43 10*3/uL — ABNORMAL LOW (ref 150–400)
RBC: 3.02 MIL/uL — ABNORMAL LOW (ref 3.87–5.11)
RDW: 20.5 % — ABNORMAL HIGH (ref 11.5–15.5)
WBC: 4.1 10*3/uL (ref 4.0–10.5)
nRBC: 2.2 % — ABNORMAL HIGH (ref 0.0–0.2)

## 2023-07-06 LAB — BASIC METABOLIC PANEL WITH GFR
Anion gap: 8 (ref 5–15)
BUN: 12 mg/dL (ref 8–23)
CO2: 22 mmol/L (ref 22–32)
Calcium: 8.4 mg/dL — ABNORMAL LOW (ref 8.9–10.3)
Chloride: 102 mmol/L (ref 98–111)
Creatinine, Ser: 0.62 mg/dL (ref 0.44–1.00)
GFR, Estimated: >60 mL/min (ref >60)
Glucose, Bld: 87 mg/dL (ref 70–99)
Potassium: 4.1 mmol/L (ref 3.5–5.1)
Sodium: 132 mmol/L — ABNORMAL LOW (ref 135–145)

## 2023-07-06 LAB — STREP PNEUMONIAE URINARY ANTIGEN: Strep Pneumo Urinary Antigen: NEGATIVE

## 2023-07-06 MED ORDER — GUAIFENESIN ER 600 MG PO TB12
1200.0000 mg | ORAL_TABLET | Freq: Two times a day (BID) | ORAL | Status: DC
  Administered 2023-07-06 – 2023-07-07 (×3): 1200 mg via ORAL
  Filled 2023-07-06 (×3): qty 2

## 2023-07-06 NOTE — Progress Notes (Signed)
 Progress Note   Patient: [name] VWU:[id number] DOB: March 08, 1956 DOA: 07/04/2023     1 DOS: the patient was seen and examined on 07/06/2023   Brief hospital course:  [name] is a 68 y.o. 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  Patient has cough with little mucus production.  Initially she had fever in the first few days which has resolved.  Currently no fever or chills.  Patient also reports chest pain, which is located in the front chest, constant, sharp, moderate, radiating to her upper back, pleuritic, aggravated by deep breath and coughing.  Patient has been taking Augmentin and azithromycin without improvement.  No nausea, vomiting, diarrhea or abdominal pain.  No symptoms of UTI.   Data reviewed independently and ED Course: pt was found to have pancytopenia with WBC 3.9, hemoglobin 8.9, platelets 60 (patient had WBC 1.6, hemoglobin 8.4, platelets 30 on 06/23/2023), negative PCR for COVID, flu and RSV, negative RSV, troponin 9 --> 8, INR 1.1, procalcitonin 0.22.  Temperature normal, blood pressure 133/60, heart rate 107, 88, RR 18, oxygen saturation 97% on room air.  CTA negative for PE, did showed multifocal bilateral infiltration.  Patient is placed in the telemetry bed for obs.  The patient was admitted to inpatient status. She was placed on Cefepime and Vancomycin. However. The patient subsequently has tested negative for MRSA. Respiratory panel by PCR is negative. Blood cultures x 2 have been obtained, but have had no growth.  On 07/06/2023 the patient's oxygen requirements have decreased to 2L to maintain saturations in the 90's. She is feeling better. She is insistent upon discharging tomorrow as her grand daughter is due to deliver her baby on 07/08/2023. I have explained to her that she is making  good progress, but that I cannot promise that I will be able to discharge her tomorrow.   Assessment and Plan: Multifocal pneumonia: Patient does not have fever, lactic acid normal 1.1, clinically does not seem to have sepsis.  No oxygen desaturation.  Patient failed outpatient Augmentin and azithromycin treatment.  Patient is on chemotherapy, immunosuppressed, will need antibiotics with broad coverage.   - Place in tele bed for obs -The patient is receiving IV cefepime. She received one dose of Rocephin and azithromycin in the ED.  - She is currently receiving IV cefepime.  - Incentive spirometry - Mucinex for cough  - Flutter valve - Bronchodilators - Urine legionella and S. pneumococcal antigen are pending. - Follow up blood culture x2, sputum culture - there is no growth.   Acute myeloid leukemia not having achieved remission [facility]): On chemotherapy, last dose was a week ago per patient. -f/u with oncology in [facility]   Pancytopenia [facility]): Hemoglobin stable. WBC improved. Platelets have declined to 43. There are no overt signs of bleeding. -Follow CBC   Gout: Patient is not taking allopurinol currently. -Observe closely      Subjective: The patient is awake, alert, and oriented x 3. No new complaints.   Physical Exam: Vitals:   07/05/23 1341 07/05/23 1643 07/05/23 2056 07/06/23 0816  BP: (!) 126/54 131/66 136/61 123/60  Pulse: 88 87 88 87  Resp: 20 16 18 20   Temp: (!) 97.5 F (36.4 C) 97.7 F (36.5 C) 98.1 F (36.7 C) 97.7 F (36.5 C)  TempSrc:  Oral Oral Oral  SpO2: 98% 97% 92% 93%  Weight:      Height:       Exam:  Constitutional:  The patient is awake, alert, and oriented x 3. No acute distress. Respiratory:  No increased work of breathing. Positive for scattered rales. Positive for coughing with deep respiration and conversation No wheezes or rhonchi auscultated. No tactile fremitus Cardiovascular:  Regular rate and rhythm No murmurs, ectopy, or gallups. No  lateral PMI. No thrills. Abdomen:  Abdomen is soft, non-tender, non-distended No hernias, masses, or organomegaly Normoactive bowel sounds.  Musculoskeletal:  No cyanosis, clubbing, or edema Skin:  No rashes, lesions, ulcers palpation of skin: no induration or nodules Neurologic:  CN 2-12 intact Sensation all 4 extremities intact Psychiatric:  Mental status Mood, affect appropriate Orientation to person, place, time  judgment and insight appear intact  Family Communication: None available  Disposition: Status is: Inpatient Remains inpatient appropriate because: Patient is at high risk of decompensation and worsening of her respiratory status due to her cancer and her immunosuppressed state.  Planned Discharge Destination: Home    Time spent: 32 minutes  Author: Harmonii Karle, DO 07/06/2023 2:34 PM  For on call review [email or web address].

## 2023-07-06 NOTE — Plan of Care (Signed)

## 2023-07-06 NOTE — Consult Note (Signed)
 Hematology/Oncology Consult note [facility] Telephone:([phone] [phone]  Patient Care Team: Creig Hines, MD as PCP - General (Oncology)   Name of the patient: [name]  [id number]  [birth year]    Reason for consult: History of AML admitted for healthcare associated pneumonia   Requesting physician: Dr. Gerri Lins  Date of visit: 07/06/2023    History of presenting illness-patient is a 68 year old female with history of AML who received cycle 1 of decitabine at [place].  Subsequently I had referred her back to [facility] for consideration of venetoclax plus decitabine given worsening leukocytosis which was not being controlled with Hydrea.  She received cycle 2 over there and has not started cycle 3 yet.  She was complaining of worsening shortness of breath and was seen by Dr. Paulette Blanch at [facility] and was prescribed a course of Augmentin and azithromycin.  She continued to have worsening dyspnea and therefore came to [facility] ER.  Chest x-rayShowed evidence of multifocal pneumonia.  CT angio chest showed no evidence of PE and extensive patchy airspace opacities bilaterally consistent with multifocal pneumonia.  Mediastinal and bilateral hilar adenopathy probably reactive.Respiratory panel PCR testing negative so far.  Testing for COVID-negative.  Blood cultures negative so far.  Patient is presently hemodynamically stable and afebrile.  She is on 2 L of nasal cannula.  Symptomatically patient feels better. She feels close to her baseline  ECOG PS- 2  Pain scale- 0   Review of systems- Review of Systems  Constitutional:  Positive for malaise/fatigue. Negative for chills, fever and weight loss.  HENT:  Negative for congestion, ear discharge and nosebleeds.   Eyes:  Negative for blurred vision.  Respiratory:  Positive for shortness of breath. Negative for cough, hemoptysis, sputum production and wheezing.   Cardiovascular:  Negative for chest pain,  palpitations, orthopnea and claudication.  Gastrointestinal:  Negative for abdominal pain, blood in stool, constipation, diarrhea, heartburn, melena, nausea and vomiting.  Genitourinary:  Negative for dysuria, flank pain, frequency, hematuria and urgency.  Musculoskeletal:  Negative for back pain, joint pain and myalgias.  Skin:  Negative for rash.  Neurological:  Negative for dizziness, tingling, focal weakness, seizures, weakness and headaches.  Endo/Heme/Allergies:  Does not bruise/bleed easily.  Psychiatric/Behavioral:  Negative for depression and suicidal ideas. The patient does not have insomnia.     Allergies  Allergen Reactions   Penicillin G Anaphylaxis   Prednisone Anaphylaxis    Pt states it turned her into a monster, lips started swelling and couldn't breath   Codeine    Heparin     Patient Active Problem List   Diagnosis Date Noted   Colitis 07/05/2023   Multifocal pneumonia 07/04/2023   Pancytopenia (HCC) 07/04/2023   Gout 07/04/2023   Acute myeloid leukemia not having achieved remission (HCC) 03/10/2023   Anemia 03/04/2023   Aortic atherosclerosis (HCC) 03/04/2023     Past Medical History:  Diagnosis Date   GERD (gastroesophageal reflux disease)    Leukemia (HCC)      Past Surgical History:  Procedure Laterality Date   CARDIAC SURGERY     CHOLECYSTECTOMY     HIP ARTHROSCOPY     IR IMAGING GUIDED PORT INSERTION  05/19/2023   surgery on left foot Left     Social History   Socioeconomic History   Marital status: Widowed    Spouse name: Not on file   Number of children: Not on file   Years of education: Not on file   Highest education  level: Not on file  Occupational History   Not on file  Tobacco Use   Smoking status: Every Day    Current packs/day: 0.25    Average packs/day: 0.3 packs/day for 4.0 years (1.0 ttl pk-yrs)    Types: Cigarettes   Smokeless tobacco: Never  Vaping Use   Vaping status: Never Used  Substance and Sexual Activity    Alcohol use: Not Currently   Drug use: Not Currently   Sexual activity: Not Currently  Other Topics Concern   Not on file  Social History Narrative   Not on file   Social Drivers of Health   Financial Resource Strain: Low Risk  (06/05/2023)   Received from [facility]   Overall Financial Resource Strain (CARDIA)    Difficulty of Paying Living Expenses: Not very hard  Food Insecurity: No Food Insecurity (07/05/2023)   Hunger Vital Sign    Worried About Running Out of Food in the Last Year: Never true    Ran Out of Food in the Last Year: Never true  Transportation Needs: No Transportation Needs (07/05/2023)   PRAPARE - Administrator, Civil Service (Medical): No    Lack of Transportation (Non-Medical): No  Physical Activity: Insufficiently Active (03/22/2021)   Exercise Vital Sign    Days of Exercise per Week: 2 days    Minutes of Exercise per Session: 10 min  Stress: No Stress Concern Present (03/22/2021)   [employer] of Occupational Health - Occupational Stress Questionnaire    Feeling of Stress : Not at all  Social Connections: Socially Isolated (07/05/2023)   Social Connection and Isolation Panel [NHANES]    Frequency of Communication with Friends and Family: More than three times a week    Frequency of Social Gatherings with Friends and Family: Once a week    Attends Religious Services: Never    Database administrator or Organizations: No    Attends Banker Meetings: Never    Marital Status: Widowed  Intimate Partner Violence: Not At Risk (07/05/2023)   Humiliation, Afraid, Rape, and Kick questionnaire    Fear of Current or Ex-Partner: No    Emotionally Abused: No    Physically Abused: No    Sexually Abused: No     Family History  Problem Relation Age of Onset   Cancer Mother    Heart failure Father    Heart failure Sister    Cancer Maternal Grandmother    Cancer Maternal Grandfather      Current Facility-Administered  Medications:    acetaminophen (TYLENOL) tablet 650 mg, 650 mg, Oral, Q6H PRN, Lorretta Harp, MD, 650 mg at 07/06/23 1417   albuterol (PROVENTIL) (2.5 MG/3ML) 0.083% nebulizer solution 2.5 mg, 2.5 mg, Inhalation, Q4H PRN, Lorretta Harp, MD, 2.5 mg at 07/05/23 1451   ALPRAZolam (XANAX) tablet 0.5 mg, 0.5 mg, Oral, QHS PRN, Lorretta Harp, MD, 0.5 mg at 07/05/23 2208   ascorbic acid (VITAMIN C) tablet 500 mg, 500 mg, Oral, Daily, Lorretta Harp, MD, 500 mg at 07/06/23 0834   ceFEPIme (MAXIPIME) 2 g in sodium chloride 0.9 % 100 mL IVPB, 2 g, Intravenous, Q12H, Angelique Blonder, RPH, Last Rate: 200 mL/hr at 07/06/23 0849, 2 g at 07/06/23 0849   cholecalciferol (VITAMIN D3) 25 MCG (1000 UNIT) tablet 500 Units, 500 Units, Oral, Daily, Lorretta Harp, MD, 500 Units at 07/06/23 1914   dextromethorphan-guaiFENesin (MUCINEX DM) 30-600 MG per 12 hr tablet 1 tablet, 1 tablet, Oral, BID PRN, Lorretta Harp,  MD   guaiFENesin (MUCINEX) 12 hr tablet 1,200 mg, 1,200 mg, Oral, BID, Swayze, Ava, DO, 1,200 mg at 07/06/23 1458   hydrALAZINE (APRESOLINE) injection 5 mg, 5 mg, Intravenous, Q2H PRN, Lorretta Harp, MD   multivitamin (RENA-VIT) tablet 1 tablet, 1 tablet, Oral, Daily, Lorretta Harp, MD, 1 tablet at 07/06/23 2841   nicotine (NICODERM CQ - dosed in mg/24 hours) patch 21 mg, 21 mg, Transdermal, Daily, Lorretta Harp, MD, 21 mg at 07/06/23 0935   ondansetron [facility]) injection 4 mg, 4 mg, Intravenous, Q8H PRN, Lorretta Harp, MD, 4 mg at 07/05/23 3244   oxyCODONE-acetaminophen (PERCOCET/ROXICET) 5-325 MG per tablet 1 tablet, 1 tablet, Oral, Q4H PRN, Lorretta Harp, MD   pantoprazole (PROTONIX) EC tablet 20 mg, 20 mg, Oral, Daily, Lorretta Harp, MD, 20 mg at 07/06/23 0102   polyethylene glycol (MIRALAX / GLYCOLAX) packet 17 g, 17 g, Oral, Daily PRN, Lorretta Harp, MD   Physical exam:  Vitals:   07/05/23 1643 07/05/23 2056 07/06/23 0816 07/06/23 1514  BP: 131/66 136/61 123/60 133/69  Pulse: 87 88 87 91  Resp: 16 18 20 16   Temp: 97.7 F (36.5 C) 98.1  F (36.7 C) 97.7 F (36.5 C) 98.6 F (37 C)  TempSrc: Oral Oral Oral   SpO2: 97% 92% 93% 97%  Weight:      Height:       Physical Exam Constitutional:      Comments: On 2 L O2  Cardiovascular:     Rate and Rhythm: Normal rate and regular rhythm.     Heart sounds: Normal heart sounds.  Pulmonary:     Effort: Pulmonary effort is normal.     Comments: Breath sounds decreased over bases Abdominal:     General: Bowel sounds are normal.     Palpations: Abdomen is soft.  Skin:    General: Skin is warm and dry.  Neurological:     Mental Status: She is alert and oriented to person, place, and time.           Latest Ref Rng & Units 07/06/2023    5:23 AM  CMP  Glucose 70 - 99 mg/dL 87   BUN 8 - 23 mg/dL 12   Creatinine 7.25 - 1.00 mg/dL 3.66   Sodium 440 - 347 mmol/L 132   Potassium 3.5 - 5.1 mmol/L 4.1   Chloride 98 - 111 mmol/L 102   CO2 22 - 32 mmol/L 22   Calcium 8.9 - 10.3 mg/dL 8.4       Latest Ref Rng & Units 07/06/2023    5:23 AM  CBC  WBC 4.0 - 10.5 K/uL 4.1   Hemoglobin 12.0 - 15.0 g/dL 8.8   Hematocrit 42.5 - 46.0 % 26.3   Platelets 150 - 400 K/uL 43     @IMAGES @  CT Angio Chest PE W/Cm &/Or Wo Cm Result Date: 07/04/2023 CLINICAL DATA:  Pulmonary embolism (PE) suspected, high prob. History of leukemia EXAM: CT ANGIOGRAPHY CHEST WITH CONTRAST TECHNIQUE: Multidetector CT imaging of the chest was performed using the standard protocol during bolus administration of intravenous contrast. Multiplanar CT image reconstructions and MIPs were obtained to evaluate the vascular anatomy. RADIATION DOSE REDUCTION: This exam was performed according to the departmental dose-optimization program which includes automated exposure control, adjustment of the mA and/or kV according to patient size and/or use of iterative reconstruction technique. CONTRAST:  75mL OMNIPAQUE IOHEXOL 350 MG/ML SOLN COMPARISON:  05/17/2023, 10/30/2004 FINDINGS: Cardiovascular: Satisfactory opacification  of the pulmonary arteries to the  segmental level. No evidence of pulmonary embolism. Thoracic aorta is nonaneurysmal. Scattered atherosclerotic vascular calcifications of the aorta and coronary arteries. Mild cardiomegaly. No pericardial effusion. Right internal jugular approach chest port terminates at the proximal right atrium. Mediastinum/Nodes: No axillary lymphadenopathy. Multiple enlarged mediastinal and bilateral hilar lymph nodes. Reference nodes include 1.2 cm right paratracheal (series 5, image 33), 1.5 cm subcarinal (series 5, image 53), 1.5 cm right hilar (series 5, image 57), and 1.5 cm left hilar (series 5, image 60). Thyroid gland, trachea, and esophagus demonstrate no significant abnormality. Tiny hiatal hernia. Lungs/Pleura: Extensive patchy airspace opacities bilaterally, most pronounced within the mid to lower lung zones bilaterally and with a slightly peripheral-predominant distribution. No pleural effusion. No pneumothorax. Upper Abdomen: Splenomegaly. No acute abnormality within the included upper abdomen. Musculoskeletal: No significant bony or chest wall abnormality. Review of the MIP images confirms the above findings. IMPRESSION: 1. No evidence of pulmonary embolism. 2. Extensive patchy airspace opacities bilaterally, most pronounced within the mid to lower lung zones bilaterally and with a slightly peripheral-predominant distribution. Findings are most compatible with multifocal pneumonia, including atypical/viral etiologies. Short-term follow-up imaging after appropriate treatment is recommended to document resolution. 3. Mediastinal and bilateral hilar lymphadenopathy, which may be reactive or related to patient's history of leukemia. 4. Splenomegaly. 5. Aortic and coronary artery atherosclerosis (ICD10-I70.0). Electronically Signed   By: Duanne Guess D.O.   On: 07/04/2023 17:43   DG Chest 2 View Result Date: 07/04/2023 CLINICAL DATA:  Shortness of breath and chest pain. EXAM:  CHEST - 2 VIEW COMPARISON:  Chest radiograph dated 03/03/2023. FINDINGS: The heart size and mediastinal contours are within normal limits. Right chest Port-A-Cath tip overlies the lower SVC. Multifocal lingular and bibasilar opacities, more pronounced on the left. No pleural effusion or pneumothorax. No acute osseous abnormality. IMPRESSION: Multifocal bibasilar and lingular opacities, more pronounced on the left, concerning for multifocal pneumonia. Followup PA and lateral chest X-ray is recommended in 3-4 weeks following trial of antibiotic therapy to monitor for resolution. Electronically Signed   By: Hart Robinsons M.D.   On: 07/04/2023 16:05    Assessment and plan- Patient is a 68 y.o. female with history of AML admitted for healthcare associated pneumonia  Acute hypoxic respiratory failure secondary to healthcare associated pneumonia: Patient is presently hemodynamically stable and saturating more than 90% on 2 L of nasal cannula.  CT angio chest did not show any evidence of pulmonary embolism but did show multifocal pneumonia for which she is on IV antibiotics.  Management per primary team and hopefully patient can be discharged over the next 1-2 days on oral antibiotics  AML: Patient has evidence of pancytopenia secondary to her AML as well as underlying treatment.  Cycle 3 will be on hold until acute issues resolved.  Pancytopenia: Transfuse irradiated products for hemoglobin less than 7 and platelets less than 10.  Neutropenia will be monitored conservatively.  No indication for Neupogen at this time  Visit Diagnosis 1. Sepsis without acute organ dysfunction, due to unspecified organism (HCC)   2. Pneumonia of both lower lobes due to infectious organism   3. Acute myeloid leukemia not having achieved remission (HCC)     Dr. Owens Shark, MD, MPH [facility] at [facility] [phone] 07/06/2023

## 2023-07-07 ENCOUNTER — Telehealth: Payer: Self-pay

## 2023-07-07 DIAGNOSIS — J189 Pneumonia, unspecified organism: Secondary | ICD-10-CM | POA: Diagnosis not present

## 2023-07-07 LAB — CBC
HCT: 25.4 % — ABNORMAL LOW (ref 36.0–46.0)
Hemoglobin: 8.6 g/dL — ABNORMAL LOW (ref 12.0–15.0)
MCH: 29.4 pg (ref 26.0–34.0)
MCHC: 33.9 g/dL (ref 30.0–36.0)
MCV: 86.7 fL (ref 80.0–100.0)
Platelets: 42 10*3/uL — ABNORMAL LOW (ref 150–400)
RBC: 2.93 MIL/uL — ABNORMAL LOW (ref 3.87–5.11)
RDW: 20.8 % — ABNORMAL HIGH (ref 11.5–15.5)
WBC: 3.4 10*3/uL — ABNORMAL LOW (ref 4.0–10.5)
nRBC: 1.5 % — ABNORMAL HIGH (ref 0.0–0.2)

## 2023-07-07 MED ORDER — NICOTINE 21 MG/24HR TD PT24: 21.0000 mg | MEDICATED_PATCH | Freq: Every day | TRANSDERMAL | 0 refills | Status: DC

## 2023-07-07 MED ORDER — DOXYCYCLINE HYCLATE 100 MG PO TABS: 100.0000 mg | ORAL_TABLET | Freq: Two times a day (BID) | ORAL | 0 refills | Status: AC

## 2023-07-07 MED ORDER — GUAIFENESIN ER 600 MG PO TB12: 1200.0000 mg | ORAL_TABLET | Freq: Two times a day (BID) | ORAL | 0 refills | Status: DC

## 2023-07-07 MED ORDER — ALBUTEROL SULFATE (2.5 MG/3ML) 0.083% IN NEBU: 2.5000 mg | INHALATION_SOLUTION | RESPIRATORY_TRACT | 12 refills | Status: DC | PRN

## 2023-07-07 NOTE — Telephone Encounter (Signed)
 Per Dr. Smith [name] "this patient was discharged from the hospital for pneumonia. please check with her if she has appt with [facility] this week. if she doesn't, we will have her see lauren this week, also let her know that she is scheduled for her next cycle of chemo and to see me on Monday 4/7 for 5 days".  Voice message left on [phone].

## 2023-07-07 NOTE — Plan of Care (Signed)

## 2023-07-07 NOTE — TOC Transition Note (Signed)
 Transition of Care [facility]) - Discharge Note   Patient Details  Name: [name] MRN: [id number] Date of Birth: 1956-04-07  [facility] CM/SW Contact:  Margarito Liner, LCSW Phone Number: 07/07/2023, 1:52 PM   Clinical Narrative:  Patient has orders to discharge home today. MD placed order for a nebulizer machine. Patient does not have an agency preference. Ordered through [employer]. No further concerns. Patient confirmed she will have a ride home. CSW signing off.   Final next level of care: Home/Self Care Barriers to Discharge: No Barriers Identified   Patient Goals and CMS Choice            Discharge Placement                    Patient and family notified of of transfer: 07/07/23  Discharge Plan and Services Additional resources added to the After Visit Summary for                  DME Arranged: Nebulizer machine DME Agency: [employer] Date DME Agency Contacted: 07/07/23   Representative spoke with at DME Agency: Marthann Schiller            Social Drivers of Health (SDOH) Interventions SDOH Screenings   Food Insecurity: No Food Insecurity (07/05/2023)  Housing: Low Risk  (07/05/2023)  Transportation Needs: No Transportation Needs (07/05/2023)  Utilities: Not At Risk (07/05/2023)  Alcohol Screen: Low Risk  (03/22/2021)  Depression (PHQ2-9): Low Risk  (05/06/2023)  Financial Resource Strain: Low Risk  (06/05/2023)   Received from [facility]  Physical Activity: Insufficiently Active (03/22/2021)  Social Connections: Socially Isolated (07/05/2023)  Stress: No Stress Concern Present (03/22/2021)  Tobacco Use: High Risk (07/05/2023)  Health Literacy: Low Risk  (03/13/2023)   Received from [facility]     Readmission Risk Interventions     No data to display

## 2023-07-07 NOTE — Discharge Instructions (Signed)
 the [facility] offers a [employer] that anyone can call toll free at [phone]. The friendship line is available 24 hours a day  [employer] is a Program of All-inclusive Care for the Elderly (PACE). Their mission is to promote and sustain the independence of seniors wishing to remain in the community. They provide seniors with comprehensive long-term health, social, medical and dietary care. Their program is a safe alternative to nursing home care. [phone]  [facility] Physical Address [facility] [address] [place], [place] [address] Phone: [phone]. . Online zoom yoga class, connect with others without leaving your home [facility] offers [place] dance cardio sessions for individuals via Zoom. This program provides: - Dance fitness activities Please contact program for more information. Servinganyone in need adults 18+ hiv/aids individuals families Call [phone]  Email [email or web address] [email or web address] to get more info  [employer] offers an online [employer] to individuals where they can receive help to focus on their best health. Whether you're a [employer] member or not, the neighborhood center offers a... Main Serviceshealth education  exercise & fitness  community support services  recreation  virtual support Other Servicessupport groups Servinganyone in need adults young adults teens seniors individuals families [email or web address] to get more info  Schedule on their website  The [facility] offers an array of activities for adults age 27 and over. This program provides:- Fitness and health programs- Tech classes- Activity books Main Serviceshealth education  community support services  exercise & fitness  recreation  more education Servingseniors  Call [phone]    For more resources go online to [email or web address] and type in you zipcode

## 2023-07-07 NOTE — Telephone Encounter (Signed)
 Per Dr. Smith [name] patient is still in hospital; will readdress when patient is discharged

## 2023-07-07 NOTE — TOC CM/SW Note (Signed)
 Transition of Care [facility]) - Inpatient Brief Assessment   Patient Details  Name: [name] MRN: [id number] Date of Birth: 1955-06-13  Transition of Care [facility]) CM/SW Contact:    Margarito Liner, LCSW Phone Number: 07/07/2023, 11:53 AM   Clinical Narrative: CSW reviewed chart. SDOH flag for social connections. CSW added resources to AVS. No other TOC needs identified at this time. CSW will continue to follow progress. Please place [facility] consult if any needs arise.  Transition of Care Asessment: Insurance and Status: Insurance coverage has been reviewed Patient has primary care physician:  (Has Dr. Smith [name] listed (Oncology)) Home environment has been reviewed: Single family home Prior level of function:: Not documented Prior/Current Home Services: No current home services Social Drivers of Health Review: SDOH reviewed interventions complete Readmission risk has been reviewed: Yes Transition of care needs: no transition of care needs at this time

## 2023-07-07 NOTE — Discharge Summary (Signed)
 Physician Discharge Summary   Patient: [name] MRN: [id number] DOB: 1956/01/15  Admit date:     07/04/2023  Discharge date: 07/07/23  Discharge Physician: Fran Lowes   PCP: Creig Hines, MD   Recommendations at discharge:    Discharge to home Follow up with PCP in 7-10 days. Keep appointments with oncology. Complete antibiotics.  Discharge Diagnoses: Principal Problem:   Multifocal pneumonia Active Problems:   Acute myeloid leukemia not having achieved remission (HCC)   Pancytopenia (HCC)   Gout   Colitis  Resolved Problems:   * No resolved hospital problems. *  Hospital Course: [name] is a 68 y.o. 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  Patient has cough with little mucus production.  Initially she had fever in the first few days which has resolved.  Currently no fever or chills.  Patient also reports chest pain, which is located in the front chest, constant, sharp, moderate, radiating to her upper back, pleuritic, aggravated by deep breath and coughing.  Patient has been taking Augmentin and azithromycin without improvement.  No nausea, vomiting, diarrhea or abdominal pain.  No symptoms of UTI.   Data reviewed independently and ED Course: pt was found to have pancytopenia with WBC 3.9, hemoglobin 8.9, platelets 60 (patient had WBC 1.6, hemoglobin 8.4, platelets 30 on 06/23/2023), negative PCR for COVID, flu and RSV, negative RSV, troponin 9 --> 8, INR 1.1, procalcitonin 0.22.  Temperature normal, blood pressure 133/60, heart rate 107, 88, RR 18, oxygen saturation 97% on room air.  CTA negative for PE, did showed multifocal bilateral infiltration.  Patient is placed in the telemetry bed for obs.   The patient was admitted to inpatient status. She was placed on Cefepime and  Vancomycin. However. The patient subsequently has tested negative for MRSA. Respiratory panel by PCR is negative. Blood cultures x 2 have been obtained, but have had no growth.   On 07/06/2023 the patient's oxygen requirements have decreased to 2L to maintain saturations in the 90's. She is feeling better. She is insistent upon discharging tomorrow as her grand daughter is due to deliver her baby on 07/08/2023. I have explained to her that she is making good progress, but that I cannot promise that I will be able to discharge her tomorrow.   The patient was ambulated in the halls on room air on 07/07/2023. She maintained saturations of 88% and above. She will be discharged to home in fair condition.  Assessment and Plan: Multifocal pneumonia: Patient does not have fever, lactic acid normal 1.1, clinically does not seem to have sepsis.  No oxygen desaturation.  Patient failed outpatient Augmentin and azithromycin treatment.  Patient is on chemotherapy, immunosuppressed, will need antibiotics with broad coverage.   The patient has completed 4 days of IV cefepime. She will be discharged to home on doxycycline for another 6 days.   COPD exacerbation: The patient has benefited from nebulized albuterol while inpatient. She is a long time smoker and it is likely that she has COPD that was exacerbated by this pneumonia. She will be discharged on nebulized albuterol. Nebulizer will also be sent home with her. She is also sent home with an Rx for a nicotine patch. She would benefit from smoking cessation.   Acute myeloid leukemia not having achieved remission [facility]): On chemotherapy, last  dose was a week ago per patient. -f/u with oncology in [facility]   Pancytopenia [facility]): Hemoglobin stable. WBC improved. Platelets have declined to 43. There are no overt signs of bleeding. Resolving.   Gout: Patient is not taking allopurinol currently. -Observe closely        Consultants: None Procedures performed: None   Disposition: Home Diet recommendation:  Discharge Diet Orders (From admission, onward)     Start     Ordered   07/07/23 0000  Diet - low sodium heart healthy        07/07/23 1320           Regular diet DISCHARGE MEDICATION: Allergies as of 07/07/2023       Reactions   Penicillin G Anaphylaxis   Prednisone Anaphylaxis   Pt states it turned her into a monster, lips started swelling and couldn't breath   Codeine    Heparin         Medication List     STOP taking these medications    amoxicillin-clavulanate 875-125 MG tablet Commonly known as: AUGMENTIN   azithromycin 250 MG tablet Commonly known as: ZITHROMAX   levofloxacin 250 MG tablet Commonly known as: Levaquin   ondansetron 8 MG tablet Commonly known as: Zofran       TAKE these medications    albuterol (2.5 MG/3ML) 0.083% nebulizer solution Commonly known as: PROVENTIL Inhale 3 mLs (2.5 mg total) into the lungs every 4 (four) hours as needed for wheezing or shortness of breath.   allopurinol 300 MG tablet Commonly known as: Zyloprim Take 1 tablet (300 mg total) by mouth daily.   ALPRAZolam 0.5 MG tablet Commonly known as: XANAX Take 0.5 mg by mouth at bedtime as needed for sleep.   ascorbic acid 500 MG tablet Commonly known as: VITAMIN C Take 500 mg by mouth daily.   b complex vitamins capsule Take 1 capsule by mouth daily.   cholecalciferol 10 MCG (400 UNIT) Tabs tablet Commonly known as: VITAMIN D3 Take 400 Units by mouth.   doxycycline 100 MG tablet Commonly known as: VIBRA-TABS Take 1 tablet (100 mg total) by mouth 2 (two) times daily for 6 days.   guaiFENesin 600 MG 12 hr tablet Commonly known as: MUCINEX Take 2 tablets (1,200 mg total) by mouth 2 (two) times daily.   hydroxyurea 500 MG capsule Commonly known as: HYDREA Take 1,000 mg by mouth 2 (two) times daily. May take with food to minimize GI side effects.   hydrOXYzine 25 MG tablet Commonly known as: ATARAX Take 25 mg  by mouth 3 (three) times daily as needed for anxiety.   lidocaine-prilocaine cream Commonly known as: EMLA Apply 1 Application topically as needed.   nicotine 21 mg/24hr patch Commonly known as: NICODERM CQ - dosed in mg/24 hours Place 1 patch (21 mg total) onto the skin daily. Start taking on: July 08, 2023   OLANZapine 5 MG tablet Commonly known as: ZYPREXA Take 5 mg by mouth at bedtime.   oxyCODONE-acetaminophen 5-325 MG tablet Commonly known as: Percocet Take 1 tablet by mouth every 4 (four) hours as needed for severe pain (pain score 7-10).   pantoprazole 20 MG tablet Commonly known as: Protonix Take 1 tablet (20 mg total) by mouth daily.   polyethylene glycol 17 g packet Commonly known as: MIRALAX / GLYCOLAX Take 17 g by mouth daily as needed for mild constipation.   prochlorperazine 10 MG tablet Commonly known as: COMPAZINE Take 1 tablet (10 mg total) by mouth every  6 (six) hours as needed for nausea or vomiting.   traZODone 50 MG tablet Commonly known as: DESYREL Take 25 mg by mouth at bedtime.   valACYclovir 500 MG tablet Commonly known as: VALTREX Take 1 tablet (500 mg total) by mouth 2 (two) times daily.   venetoclax 100 MG tablet Commonly known as: VENCLEXTA Take 4 tablets (400 mg total) by mouth once weekly on day 1, 8, 15, and 22 of each cycle. Take with a meal and water. Do not chew, crush, or break tablets.   voriconazole 200 MG tablet Commonly known as: VFEND Take 200 mg by mouth every 12 (twelve) hours.               Durable Medical Equipment  (From admission, onward)           Start     Ordered   07/07/23 0000  For home use only DME Nebulizer machine       Question Answer Comment  Patient needs a nebulizer to treat with the following condition COPD with acute exacerbation (HCC)   Length of Need Lifetime   Additional equipment included Administration kit   Additional equipment included Filter      07/07/23 1320             Discharge Exam: Filed Weights   07/04/23 1424 07/04/23 1617  Weight: 71.8 kg 67.1 kg   Exam:  Constitutional:  The patient is awake, alert, and oriented x 3. No acute distress. Respiratory:  No increased work of breathing. No wheezes, rales, or rhonchi No tactile fremitus Cardiovascular:  Regular rate and rhythm No murmurs, ectopy, or gallups. No lateral PMI. No thrills. Abdomen:  Abdomen is soft, non-tender, non-distended No hernias, masses, or organomegaly Normoactive bowel sounds.  Musculoskeletal:  No cyanosis, clubbing, or edema Skin:  No rashes, lesions, ulcers palpation of skin: no induration or nodules Neurologic:  CN 2-12 intact Sensation all 4 extremities intact Psychiatric:  Mental status Mood, affect appropriate Orientation to person, place, time  judgment and insight appear intact   Condition at discharge: fair  The results of significant diagnostics from this hospitalization (including imaging, microbiology, ancillary and laboratory) are listed below for reference.   Imaging Studies: CT Angio Chest PE W/Cm &/Or Wo Cm Result Date: 07/04/2023 CLINICAL DATA:  Pulmonary embolism (PE) suspected, high prob. History of leukemia EXAM: CT ANGIOGRAPHY CHEST WITH CONTRAST TECHNIQUE: Multidetector CT imaging of the chest was performed using the standard protocol during bolus administration of intravenous contrast. Multiplanar CT image reconstructions and MIPs were obtained to evaluate the vascular anatomy. RADIATION DOSE REDUCTION: This exam was performed according to the departmental dose-optimization program which includes automated exposure control, adjustment of the mA and/or kV according to patient size and/or use of iterative reconstruction technique. CONTRAST:  75mL OMNIPAQUE IOHEXOL 350 MG/ML SOLN COMPARISON:  05/17/2023, 10/30/2004 FINDINGS: Cardiovascular: Satisfactory opacification of the pulmonary arteries to the segmental level. No evidence of pulmonary  embolism. Thoracic aorta is nonaneurysmal. Scattered atherosclerotic vascular calcifications of the aorta and coronary arteries. Mild cardiomegaly. No pericardial effusion. Right internal jugular approach chest port terminates at the proximal right atrium. Mediastinum/Nodes: No axillary lymphadenopathy. Multiple enlarged mediastinal and bilateral hilar lymph nodes. Reference nodes include 1.2 cm right paratracheal (series 5, image 33), 1.5 cm subcarinal (series 5, image 53), 1.5 cm right hilar (series 5, image 57), and 1.5 cm left hilar (series 5, image 60). Thyroid gland, trachea, and esophagus demonstrate no significant abnormality. Tiny hiatal hernia. Lungs/Pleura: Extensive patchy airspace  opacities bilaterally, most pronounced within the mid to lower lung zones bilaterally and with a slightly peripheral-predominant distribution. No pleural effusion. No pneumothorax. Upper Abdomen: Splenomegaly. No acute abnormality within the included upper abdomen. Musculoskeletal: No significant bony or chest wall abnormality. Review of the MIP images confirms the above findings. IMPRESSION: 1. No evidence of pulmonary embolism. 2. Extensive patchy airspace opacities bilaterally, most pronounced within the mid to lower lung zones bilaterally and with a slightly peripheral-predominant distribution. Findings are most compatible with multifocal pneumonia, including atypical/viral etiologies. Short-term follow-up imaging after appropriate treatment is recommended to document resolution. 3. Mediastinal and bilateral hilar lymphadenopathy, which may be reactive or related to patient's history of leukemia. 4. Splenomegaly. 5. Aortic and coronary artery atherosclerosis (ICD10-I70.0). Electronically Signed   By: Duanne Guess D.O.   On: 07/04/2023 17:43   DG Chest 2 View Result Date: 07/04/2023 CLINICAL DATA:  Shortness of breath and chest pain. EXAM: CHEST - 2 VIEW COMPARISON:  Chest radiograph dated 03/03/2023. FINDINGS: The  heart size and mediastinal contours are within normal limits. Right chest Port-A-Cath tip overlies the lower SVC. Multifocal lingular and bibasilar opacities, more pronounced on the left. No pleural effusion or pneumothorax. No acute osseous abnormality. IMPRESSION: Multifocal bibasilar and lingular opacities, more pronounced on the left, concerning for multifocal pneumonia. Followup PA and lateral chest X-ray is recommended in 3-4 weeks following trial of antibiotic therapy to monitor for resolution. Electronically Signed   By: Hart Robinsons M.D.   On: 07/04/2023 16:05    Microbiology: Results for orders placed or performed during the hospital encounter of 07/04/23  Resp panel by RT-PCR (RSV, Flu A&B, Covid) Anterior Nasal Swab     Status: None   Collection Time: 07/04/23  3:41 PM   Specimen: Anterior Nasal Swab  Result Value Ref Range Status   SARS Coronavirus 2 by RT PCR NEGATIVE NEGATIVE Final    Comment: (NOTE) SARS-CoV-2 target nucleic acids are NOT DETECTED.  The SARS-CoV-2 RNA is generally detectable in upper respiratory specimens during the acute phase of infection. The lowest concentration of SARS-CoV-2 viral copies this assay can detect is 138 copies/mL. A negative result does not preclude SARS-Cov-2 infection and should not be used as the sole basis for treatment or other patient management decisions. A negative result may occur with  improper specimen collection/handling, submission of specimen other than nasopharyngeal swab, presence of viral mutation(s) within the areas targeted by this assay, and inadequate number of viral copies(<138 copies/mL). A negative result must be combined with clinical observations, patient history, and epidemiological information. The expected result is Negative.  Fact Sheet for Patients:  BloggerCourse.com  Fact Sheet for Healthcare Providers:  SeriousBroker.it  This test is no t yet  approved or cleared by the [place] FDA and  has been authorized for detection and/or diagnosis of SARS-CoV-2 by FDA under an Emergency Use Authorization (EUA). This EUA will remain  in effect (meaning this test can be used) for the duration of the COVID-19 declaration under Section 564(b)(1) of the Act, 21 U.S.C.section 360bbb-3(b)(1), unless the authorization is terminated  or revoked sooner.       Influenza A by PCR NEGATIVE NEGATIVE Final   Influenza B by PCR NEGATIVE NEGATIVE Final    Comment: (NOTE) The Xpert Xpress SARS-CoV-2/FLU/RSV plus assay is intended as an aid in the diagnosis of influenza from Nasopharyngeal swab specimens and should not be used as a sole basis for treatment. Nasal washings and aspirates are unacceptable for Xpert Xpress SARS-CoV-2/FLU/RSV  testing.  Fact Sheet for Patients: BloggerCourse.com  Fact Sheet for Healthcare Providers: SeriousBroker.it  This test is not yet approved or cleared by the [place] FDA and has been authorized for detection and/or diagnosis of SARS-CoV-2 by FDA under an Emergency Use Authorization (EUA). This EUA will remain in effect (meaning this test can be used) for the duration of the COVID-19 declaration under Section 564(b)(1) of the Act, 21 U.S.C. section 360bbb-3(b)(1), unless the authorization is terminated or revoked.     Resp Syncytial Virus by PCR NEGATIVE NEGATIVE Final    Comment: (NOTE) Fact Sheet for Patients: BloggerCourse.com  Fact Sheet for Healthcare Providers: SeriousBroker.it  This test is not yet approved or cleared by the [place] FDA and has been authorized for detection and/or diagnosis of SARS-CoV-2 by FDA under an Emergency Use Authorization (EUA). This EUA will remain in effect (meaning this test can be used) for the duration of the COVID-19 declaration under Section 564(b)(1) of  the Act, 21 U.S.C. section 360bbb-3(b)(1), unless the authorization is terminated or revoked.  Performed at [facility], [address] [place], [place] [address]   Culture, blood (routine x 2)     Status: None (Preliminary result)   Collection Time: 07/04/23  3:42 PM   Specimen: BLOOD  Result Value Ref Range Status   Specimen Description BLOOD BLOOD RIGHT ARM  Final   Special Requests   Final    BOTTLES DRAWN AEROBIC AND ANAEROBIC Blood Culture results may not be optimal due to an inadequate volume of blood received in culture bottles   Culture   Final    NO GROWTH 3 DAYS Performed at [facility], [address] [place], [place] [address]    Report Status PENDING  Incomplete  Culture, blood (routine x 2)     Status: None (Preliminary result)   Collection Time: 07/04/23  3:50 PM   Specimen: BLOOD  Result Value Ref Range Status   Specimen Description BLOOD BLOOD LEFT ARM  Final   Special Requests   Final    BOTTLES DRAWN AEROBIC AND ANAEROBIC Blood Culture results may not be optimal due to an inadequate volume of blood received in culture bottles   Culture   Final    NO GROWTH 3 DAYS Performed at [facility], [address] [place], [place] [address]    Report Status PENDING  Incomplete  Respiratory (~20 pathogens) panel by PCR     Status: None   Collection Time: 07/04/23  7:45 PM   Specimen: Nasopharyngeal Swab; Respiratory  Result Value Ref Range Status   Adenovirus NOT DETECTED NOT DETECTED Final   Coronavirus 229E NOT DETECTED NOT DETECTED Final    Comment: (NOTE) The Coronavirus on the Respiratory Panel, DOES NOT test for the novel  Coronavirus (2019 nCoV)    Coronavirus HKU1 NOT DETECTED NOT DETECTED Final   Coronavirus NL63 NOT DETECTED NOT DETECTED Final   Coronavirus OC43 NOT DETECTED NOT DETECTED Final   Metapneumovirus NOT DETECTED NOT DETECTED Final   Rhinovirus / Enterovirus NOT DETECTED NOT DETECTED Final   Influenza A NOT  DETECTED NOT DETECTED Final   Influenza B NOT DETECTED NOT DETECTED Final   Parainfluenza Virus 1 NOT DETECTED NOT DETECTED Final   Parainfluenza Virus 2 NOT DETECTED NOT DETECTED Final   Parainfluenza Virus 3 NOT DETECTED NOT DETECTED Final   Parainfluenza Virus 4 NOT DETECTED NOT DETECTED Final   Respiratory Syncytial Virus NOT DETECTED NOT DETECTED Final   Bordetella pertussis  NOT DETECTED NOT DETECTED Final   Bordetella Parapertussis NOT DETECTED NOT DETECTED Final   Chlamydophila pneumoniae NOT DETECTED NOT DETECTED Final   Mycoplasma pneumoniae NOT DETECTED NOT DETECTED Final    Comment: Performed at [facility], [address] [place], [place] [address]  MRSA Next Gen by PCR, Nasal     Status: None   Collection Time: 07/05/23  5:07 AM   Specimen: Nasal Mucosa; Nasal Swab  Result Value Ref Range Status   MRSA by PCR Next Gen NOT DETECTED NOT DETECTED Final    Comment: (NOTE) The GeneXpert MRSA Assay (FDA approved for NASAL specimens only), is one component of a comprehensive MRSA colonization surveillance program. It is not intended to diagnose MRSA infection nor to guide or monitor treatment for MRSA infections. Test performance is not FDA approved in patients less than 76 years old. Performed at [facility], [address] [place], [place] [address]     Labs: CBC: Recent Labs  Lab 07/04/23 1543 07/05/23 0507 07/06/23 0523 07/07/23 0734  WBC 3.9* 3.4* 4.1 3.4*  NEUTROABS 0.7*  --   --   --   HGB 8.9* 8.8* 8.8* 8.6*  HCT 26.5* 26.5* 26.3* 25.4*  MCV 86.0 87.2 87.1 86.7  PLT 60* 49* 43* 42*   Basic Metabolic Panel: Recent Labs  Lab 07/04/23 1543 07/05/23 0507 07/06/23 0523  NA 130* 129* 132*  K 4.0 4.0 4.1  CL 99 102 102  CO2 23 18* 22  GLUCOSE 103* 100* 87  BUN 16 13 12   CREATININE 0.58 0.47 0.62  CALCIUM 8.5* 8.2* 8.4*   Liver Function Tests: Recent Labs  Lab 07/04/23 1543  AST 21  ALT 13  ALKPHOS 111  BILITOT 0.9  PROT 6.2*   ALBUMIN 2.9*   CBG: No results for input(s): "GLUCAP" in the last 168 hours.  Discharge time spent: greater than 30 minutes.  Signed: Fran Lowes, DO [facility] 07/07/2023

## 2023-07-08 LAB — LEGIONELLA PNEUMOPHILA SEROGP 1 UR AG: L. pneumophila Serogp 1 Ur Ag: NEGATIVE

## 2023-07-09 LAB — CULTURE, BLOOD (ROUTINE X 2)
Culture: NO GROWTH
Culture: NO GROWTH

## 2023-07-10 ENCOUNTER — Inpatient Hospital Stay: Attending: Oncology

## 2023-07-10 ENCOUNTER — Encounter: Payer: Self-pay | Admitting: Oncology

## 2023-07-10 ENCOUNTER — Inpatient Hospital Stay (HOSPITAL_BASED_OUTPATIENT_CLINIC_OR_DEPARTMENT_OTHER): Admitting: Nurse Practitioner

## 2023-07-10 ENCOUNTER — Encounter: Payer: Self-pay | Admitting: Nurse Practitioner

## 2023-07-10 VITALS — BP 116/67 | HR 100 | Temp 98.4°F | Resp 16 | Wt 143.0 lb

## 2023-07-10 DIAGNOSIS — D649 Anemia, unspecified: Secondary | ICD-10-CM | POA: Diagnosis not present

## 2023-07-10 DIAGNOSIS — Z09 Encounter for follow-up examination after completed treatment for conditions other than malignant neoplasm: Secondary | ICD-10-CM

## 2023-07-10 DIAGNOSIS — Z5111 Encounter for antineoplastic chemotherapy: Secondary | ICD-10-CM | POA: Diagnosis present

## 2023-07-10 DIAGNOSIS — Z79624 Long term (current) use of inhibitors of nucleotide synthesis: Secondary | ICD-10-CM | POA: Diagnosis not present

## 2023-07-10 DIAGNOSIS — C9200 Acute myeloblastic leukemia, not having achieved remission: Secondary | ICD-10-CM | POA: Insufficient documentation

## 2023-07-10 DIAGNOSIS — Z79899 Other long term (current) drug therapy: Secondary | ICD-10-CM | POA: Insufficient documentation

## 2023-07-10 LAB — CBC (CANCER CENTER ONLY)
HCT: 30.2 % — ABNORMAL LOW (ref 36.0–46.0)
Hemoglobin: 9.8 g/dL — ABNORMAL LOW (ref 12.0–15.0)
MCH: 29.5 pg (ref 26.0–34.0)
MCHC: 32.5 g/dL (ref 30.0–36.0)
MCV: 91 fL (ref 80.0–100.0)
Platelet Count: 47 10*3/uL — ABNORMAL LOW (ref 150–400)
RBC: 3.32 MIL/uL — ABNORMAL LOW (ref 3.87–5.11)
RDW: 22.5 % — ABNORMAL HIGH (ref 11.5–15.5)
WBC Count: 3.6 10*3/uL — ABNORMAL LOW (ref 4.0–10.5)
nRBC: 2.2 % — ABNORMAL HIGH (ref 0.0–0.2)

## 2023-07-10 LAB — SAMPLE TO BLOOD BANK

## 2023-07-10 NOTE — Progress Notes (Signed)
 Hematology/Oncology Consult Note [facility]  [phone] [phone]  Patient Care Team: Creig Hines, MD as PCP - General (Oncology)   Name of the patient: [name]  [id number]  May 06, 1955   Date of visit: 07/10/23  Diagnosis- acute myeloid leukemia  Chief complaint/ Reason for visit- hospital follow up with history of AML  Heme/Onc history: Patient is a 68 year old female with a past medical history significant for GERD with no other major comorbidities who was seen at [facility] with symptoms of nausea vomiting and diarrhea.  Labs showed symptomatic anemia significant thrombocytopenia and a white cell count of 33.  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  64% blasts on manual aspirate differential.  Karyotype was normal.  SF 3 B1, TET 2, R UN X1, P HF 6 and FLT3 mutations were detected.     She was seen by Dr. Bradly Bienenstock from [facility] and patient did not desire intensive induction chemotherapy and consideration for transplant.  He gave her various treatment options including metronomic therapy with low-dose weekly decitabine plus venetoclax versus azacitidine plus venetoclax versus single agent hypomethylating agent such as azacitidine or decitabine alone.  However patient declined treatment and she was concerned about travel to [facility] for treatment.     She is presently agreeable to proceeding with Decitabine monotherapy IV 5 days each month.  She has a port in place  Interval history- Patient is 68 year old female with above history of AML, on decitabine chemotherapy who presents to clinic for hospital follow up. She initiated decitabine on 05/12/2023. She takes venetoclax once a week on days 1, 8, 15, 22.  In the interim, she developed shortness of breath and cough that gradually worsened.  She had been started on Augmentin and azithromycin without improvement.   She presented to the ER on 3/29 and was admitted for multifocal pneumonia with pancytopenia.  CTA negative for PE but did show multifocal bilateral infiltration.  She received cefepime and vancomycin antibiotics.  Respiratory panel is negative.  Blood cultures were negative.  She was discharged to home on 07/07/2023 on doxycycline.  Hemoglobin was improving at discharge.  Platelets were 43.  No signs of bleeding. She is scheduled to start her next cycle of chemotherapy on/7/25.  She feels much better since her hospital discharge.  Still has some cough but overall significantly improved.  Denies breathlessness.  No fevers.  She continues antibiotics as prescribed.  Has been monitoring her oxygen levels and reports that they are good.  Has not required oxygen.  She is very excited about the birth of her first grandchild and is eager to see him.  ECOG PS- 1 Pain scale- 0   Review of systems- Review of Systems  Constitutional:  Negative for chills, fever, malaise/fatigue and weight loss.  HENT:  Negative for congestion, ear discharge, sinus pain and sore throat.   Respiratory:  Positive for cough. Negative for hemoptysis, sputum production, shortness of breath and wheezing.   Cardiovascular:  Negative for chest pain, palpitations, orthopnea and claudication.  Gastrointestinal:  Negative for abdominal pain, blood in stool, constipation, diarrhea, heartburn, melena, nausea and vomiting.  Genitourinary:  Negative for dysuria, flank pain, frequency, hematuria and urgency.  Musculoskeletal:  Negative for back pain, falls, joint pain and myalgias.  Skin:  Negative for rash.  Neurological:  Negative for dizziness, tingling, focal weakness, seizures, weakness and headaches.  Endo/Heme/Allergies:  Bruises/bleeds easily.  Psychiatric/Behavioral:  Negative for depression. The patient is not nervous/anxious.     Allergies  Allergen Reactions   Penicillin G Anaphylaxis   Prednisone Anaphylaxis    Pt states it  turned her into a monster, lips started swelling and couldn't breath   Codeine    Heparin    Past Medical History:  Diagnosis Date   GERD (gastroesophageal reflux disease)    Leukemia (HCC)    Past Surgical History:  Procedure Laterality Date   CARDIAC SURGERY     CHOLECYSTECTOMY     HIP ARTHROSCOPY     IR IMAGING GUIDED PORT INSERTION  05/19/2023   surgery on left foot Left    Social History   Socioeconomic History   Marital status: Widowed    Spouse name: Not on file   Number of children: Not on file   Years of education: Not on file   Highest education level: Not on file  Occupational History   Not on file  Tobacco Use   Smoking status: Every Day    Current packs/day: 0.25    Average packs/day: 0.3 packs/day for 4.0 years (1.0 ttl pk-yrs)    Types: Cigarettes   Smokeless tobacco: Never  Vaping Use   Vaping status: Never Used  Substance and Sexual Activity   Alcohol use: Not Currently   Drug use: Not Currently   Sexual activity: Not Currently  Other Topics Concern   Not on file  Social History Narrative   Not on file   Social Drivers of Health   Financial Resource Strain: Low Risk  (06/05/2023)   Received from [facility]   Overall Financial Resource Strain (CARDIA)    Difficulty of Paying Living Expenses: Not very hard  Food Insecurity: No Food Insecurity (07/05/2023)   Hunger Vital Sign    Worried About Running Out of Food in the Last Year: Never true    Ran Out of Food in the Last Year: Never true  Transportation Needs: No Transportation Needs (07/05/2023)   PRAPARE - Administrator, Civil Service (Medical): No    Lack of Transportation (Non-Medical): No  Physical Activity: Insufficiently Active (03/22/2021)   Exercise Vital Sign    Days of Exercise per Week: 2 days    Minutes of Exercise per Session: 10 min  Stress: No Stress Concern Present (03/22/2021)   [employer] of Occupational Health - Occupational Stress Questionnaire     Feeling of Stress : Not at all  Social Connections: Socially Isolated (07/05/2023)   Social Connection and Isolation Panel [NHANES]    Frequency of Communication with Friends and Family: More than three times a week    Frequency of Social Gatherings with Friends and Family: Once a week    Attends Religious Services: Never    Database administrator or Organizations: No    Attends Banker Meetings: Never    Marital Status: Widowed  Intimate Partner Violence: Not At Risk (07/05/2023)   Humiliation, Afraid, Rape, and Kick questionnaire    Fear of Current or Ex-Partner: No    Emotionally Abused: No    Physically Abused: No    Sexually Abused: No   Family History  Problem Relation Age of Onset   Cancer Mother    Heart failure Father    Heart failure Sister    Cancer Maternal Grandmother    Cancer Maternal Grandfather     Current Outpatient Medications:    albuterol (PROVENTIL) (2.5 MG/3ML) 0.083%  nebulizer solution, Inhale 3 mLs (2.5 mg total) into the lungs every 4 (four) hours as needed for wheezing or shortness of breath., Disp: 75 mL, Rfl: 12   allopurinol (ZYLOPRIM) 300 MG tablet, Take 1 tablet (300 mg total) by mouth daily., Disp: 30 tablet, Rfl: 2   ALPRAZolam (XANAX) 0.5 MG tablet, Take 0.5 mg by mouth at bedtime as needed for sleep., Disp: , Rfl:    ascorbic acid (VITAMIN C) 500 MG tablet, Take 500 mg by mouth daily., Disp: , Rfl:    b complex vitamins capsule, Take 1 capsule by mouth daily., Disp: , Rfl:    cholecalciferol (VITAMIN D3) 10 MCG (400 UNIT) TABS tablet, Take 400 Units by mouth., Disp: , Rfl:    doxycycline (VIBRA-TABS) 100 MG tablet, Take 1 tablet (100 mg total) by mouth 2 (two) times daily for 6 days., Disp: 12 tablet, Rfl: 0   guaiFENesin (MUCINEX) 600 MG 12 hr tablet, Take 2 tablets (1,200 mg total) by mouth 2 (two) times daily., Disp: 10 tablet, Rfl: 0   hydroxyurea (HYDREA) 500 MG capsule, Take 1,000 mg by mouth 2 (two) times daily. May take with  food to minimize GI side effects., Disp: , Rfl:    hydrOXYzine (ATARAX) 25 MG tablet, Take 25 mg by mouth 3 (three) times daily as needed for anxiety., Disp: , Rfl:    lidocaine-prilocaine (EMLA) cream, Apply 1 Application topically as needed., Disp: 30 g, Rfl: 0   nicotine (NICODERM CQ - DOSED IN MG/24 HOURS) 21 mg/24hr patch, Place 1 patch (21 mg total) onto the skin daily., Disp: 28 patch, Rfl: 0   OLANZapine (ZYPREXA) 5 MG tablet, Take 5 mg by mouth at bedtime., Disp: , Rfl:    oxyCODONE-acetaminophen (PERCOCET) 5-325 MG tablet, Take 1 tablet by mouth every 4 (four) hours as needed for severe pain (pain score 7-10)., Disp: 20 tablet, Rfl: 0   pantoprazole (PROTONIX) 20 MG tablet, Take 1 tablet (20 mg total) by mouth daily., Disp: 30 tablet, Rfl: 0   polyethylene glycol (MIRALAX / GLYCOLAX) 17 g packet, Take 17 g by mouth daily as needed for mild constipation., Disp: , Rfl:    prochlorperazine (COMPAZINE) 10 MG tablet, Take 1 tablet (10 mg total) by mouth every 6 (six) hours as needed for nausea or vomiting., Disp: 30 tablet, Rfl: 1   traZODone (DESYREL) 50 MG tablet, Take 25 mg by mouth at bedtime., Disp: , Rfl:    valACYclovir (VALTREX) 500 MG tablet, Take 1 tablet (500 mg total) by mouth 2 (two) times daily., Disp: 60 tablet, Rfl: 1   venetoclax (VENCLEXTA) 100 MG tablet, Take 4 tablets (400 mg total) by mouth once weekly on day 1, 8, 15, and 22 of each cycle. Take with a meal and water. Do not chew, crush, or break tablets., Disp: , Rfl:    voriconazole (VFEND) 200 MG tablet, Take 200 mg by mouth every 12 (twelve) hours., Disp: , Rfl:   Physical exam:  Vitals:   07/10/23 1318  BP: 116/67  Pulse: 100  Resp: 16  Temp: 98.4 F (36.9 C)  TempSrc: Tympanic  SpO2: 97%  Weight: 143 lb (64.9 kg)   Physical Exam Vitals reviewed.  Constitutional:      Appearance: She is not ill-appearing.  Pulmonary:     Effort: No respiratory distress.  Musculoskeletal:     Comments: Ambulating w/o  aids  Skin:    Coloration: Skin is not pale.     Findings: No bruising.  Neurological:  Mental Status: She is alert and oriented to person, place, and time.  Psychiatric:        Behavior: Behavior normal.        Component Value Date/Time   WBC 3.6 (L) 07/10/2023 1308   WBC 3.4 (L) 07/07/2023 0734   RBC 3.32 (L) 07/10/2023 1308   HGB 9.8 (L) 07/10/2023 1308   HCT 30.2 (L) 07/10/2023 1308   PLT 47 (L) 07/10/2023 1308   MCV 91.0 07/10/2023 1308   MCH 29.5 07/10/2023 1308   MCHC 32.5 07/10/2023 1308   RDW 22.5 (H) 07/10/2023 1308   LYMPHSABS 2.3 07/04/2023 1543   MONOABS 0.6 07/04/2023 1543   EOSABS 0.0 07/04/2023 1543   BASOSABS 0.0 07/04/2023 1543   CT Angio Chest PE W/Cm &/Or Wo Cm Result Date: 07/04/2023 CLINICAL DATA:  Pulmonary embolism (PE) suspected, high prob. History of leukemia EXAM: CT ANGIOGRAPHY CHEST WITH CONTRAST TECHNIQUE: Multidetector CT imaging of the chest was performed using the standard protocol during bolus administration of intravenous contrast. Multiplanar CT image reconstructions and MIPs were obtained to evaluate the vascular anatomy. RADIATION DOSE REDUCTION: This exam was performed according to the departmental dose-optimization program which includes automated exposure control, adjustment of the mA and/or kV according to patient size and/or use of iterative reconstruction technique. CONTRAST:  75mL OMNIPAQUE IOHEXOL 350 MG/ML SOLN COMPARISON:  05/17/2023, 10/30/2004 FINDINGS: Cardiovascular: Satisfactory opacification of the pulmonary arteries to the segmental level. No evidence of pulmonary embolism. Thoracic aorta is nonaneurysmal. Scattered atherosclerotic vascular calcifications of the aorta and coronary arteries. Mild cardiomegaly. No pericardial effusion. Right internal jugular approach chest port terminates at the proximal right atrium. Mediastinum/Nodes: No axillary lymphadenopathy. Multiple enlarged mediastinal and bilateral hilar lymph nodes.  Reference nodes include 1.2 cm right paratracheal (series 5, image 33), 1.5 cm subcarinal (series 5, image 53), 1.5 cm right hilar (series 5, image 57), and 1.5 cm left hilar (series 5, image 60). Thyroid gland, trachea, and esophagus demonstrate no significant abnormality. Tiny hiatal hernia. Lungs/Pleura: Extensive patchy airspace opacities bilaterally, most pronounced within the mid to lower lung zones bilaterally and with a slightly peripheral-predominant distribution. No pleural effusion. No pneumothorax. Upper Abdomen: Splenomegaly. No acute abnormality within the included upper abdomen. Musculoskeletal: No significant bony or chest wall abnormality. Review of the MIP images confirms the above findings. IMPRESSION: 1. No evidence of pulmonary embolism. 2. Extensive patchy airspace opacities bilaterally, most pronounced within the mid to lower lung zones bilaterally and with a slightly peripheral-predominant distribution. Findings are most compatible with multifocal pneumonia, including atypical/viral etiologies. Short-term follow-up imaging after appropriate treatment is recommended to document resolution. 3. Mediastinal and bilateral hilar lymphadenopathy, which may be reactive or related to patient's history of leukemia. 4. Splenomegaly. 5. Aortic and coronary artery atherosclerosis (ICD10-I70.0). Electronically Signed   By: Duanne Guess D.O.   On: 07/04/2023 17:43   DG Chest 2 View Result Date: 07/04/2023 CLINICAL DATA:  Shortness of breath and chest pain. EXAM: CHEST - 2 VIEW COMPARISON:  Chest radiograph dated 03/03/2023. FINDINGS: The heart size and mediastinal contours are within normal limits. Right chest Port-A-Cath tip overlies the lower SVC. Multifocal lingular and bibasilar opacities, more pronounced on the left. No pleural effusion or pneumothorax. No acute osseous abnormality. IMPRESSION: Multifocal bibasilar and lingular opacities, more pronounced on the left, concerning for multifocal  pneumonia. Followup PA and lateral chest X-ray is recommended in 3-4 weeks following trial of antibiotic therapy to monitor for resolution. Electronically Signed   By: Maryan Char.D.  On: 07/04/2023 16:05   Assessment and plan- Patient is a 68 y.o. female with history of acute myeloid leukemia adverse cytogenetics s/p 1 cycle of decitabine who returns to clinic for hospital follow-up.  Multifocal pneumonia-no improvement with Augmentin and azithromycin.  She was hospitalized and received cefepime and vancomycin in the setting of pancytopenia.  Discharged on doxycycline.  Symptomatically she continues to improve.  Recommend she continues her complete Anemia- secondary to AML and chemotherapy. Hmg 9.8 today. Hold off on transfusion today.  Thrombocytopenia- plt 47. Stable. Hold transfusion Neutropenia- off levaquin prophylaxis currently. WBC 3.6. Improved.   Disposition:  Follow up as scheduled- la   Visit Diagnosis 1. Encounter for examination following treatment at hospital   2. Symptomatic anemia     Consuello Masse, DNP, AGNP-C, [facility] [phone] (clinic) 07/10/2023

## 2023-07-11 ENCOUNTER — Inpatient Hospital Stay

## 2023-07-11 ENCOUNTER — Encounter: Payer: Self-pay | Admitting: Oncology

## 2023-07-14 ENCOUNTER — Inpatient Hospital Stay

## 2023-07-14 ENCOUNTER — Encounter: Payer: Self-pay | Admitting: Oncology

## 2023-07-14 ENCOUNTER — Inpatient Hospital Stay (HOSPITAL_BASED_OUTPATIENT_CLINIC_OR_DEPARTMENT_OTHER): Admitting: Oncology

## 2023-07-14 VITALS — BP 98/58 | HR 98 | Temp 97.6°F | Resp 19 | Ht 65.0 in | Wt 142.8 lb

## 2023-07-14 DIAGNOSIS — Z5111 Encounter for antineoplastic chemotherapy: Secondary | ICD-10-CM

## 2023-07-14 DIAGNOSIS — C9200 Acute myeloblastic leukemia, not having achieved remission: Secondary | ICD-10-CM

## 2023-07-14 LAB — CBC WITH DIFFERENTIAL (CANCER CENTER ONLY)
Abs Immature Granulocytes: 0.23 10*3/uL — ABNORMAL HIGH (ref 0.00–0.07)
Basophils Absolute: 0 10*3/uL (ref 0.0–0.1)
Basophils Relative: 1 %
Eosinophils Absolute: 0.1 10*3/uL (ref 0.0–0.5)
Eosinophils Relative: 2 %
HCT: 28 % — ABNORMAL LOW (ref 36.0–46.0)
Hemoglobin: 9.1 g/dL — ABNORMAL LOW (ref 12.0–15.0)
Immature Granulocytes: 5 %
Lymphocytes Relative: 67 %
Lymphs Abs: 3.1 10*3/uL (ref 0.7–4.0)
MCH: 29.5 pg (ref 26.0–34.0)
MCHC: 32.5 g/dL (ref 30.0–36.0)
MCV: 90.9 fL (ref 80.0–100.0)
Monocytes Absolute: 0.6 10*3/uL (ref 0.1–1.0)
Monocytes Relative: 13 %
Neutro Abs: 0.6 10*3/uL — ABNORMAL LOW (ref 1.7–7.7)
Neutrophils Relative %: 12 %
Platelet Count: 30 10*3/uL — ABNORMAL LOW (ref 150–400)
RBC: 3.08 MIL/uL — ABNORMAL LOW (ref 3.87–5.11)
RDW: 22.9 % — ABNORMAL HIGH (ref 11.5–15.5)
WBC Count: 4.7 10*3/uL (ref 4.0–10.5)
nRBC: 1.3 % — ABNORMAL HIGH (ref 0.0–0.2)

## 2023-07-14 LAB — CMP (CANCER CENTER ONLY)
ALT: 22 U/L (ref 0–44)
AST: 28 U/L (ref 15–41)
Albumin: 3.3 g/dL — ABNORMAL LOW (ref 3.5–5.0)
Alkaline Phosphatase: 82 U/L (ref 38–126)
Anion gap: 6 (ref 5–15)
BUN: 15 mg/dL (ref 8–23)
CO2: 23 mmol/L (ref 22–32)
Calcium: 8.5 mg/dL — ABNORMAL LOW (ref 8.9–10.3)
Chloride: 96 mmol/L — ABNORMAL LOW (ref 98–111)
Creatinine: 0.74 mg/dL (ref 0.44–1.00)
GFR, Estimated: >60 mL/min (ref >60)
Glucose, Bld: 120 mg/dL — ABNORMAL HIGH (ref 70–99)
Potassium: 3.5 mmol/L (ref 3.5–5.1)
Sodium: 125 mmol/L — ABNORMAL LOW (ref 135–145)
Total Bilirubin: 1 mg/dL (ref 0.0–1.2)
Total Protein: 6.6 g/dL (ref 6.5–8.1)

## 2023-07-14 MED ORDER — PROCHLORPERAZINE MALEATE 10 MG PO TABS
10.0000 mg | ORAL_TABLET | Freq: Once | ORAL | Status: AC
Start: 2023-07-14 — End: 2023-07-14
  Administered 2023-07-14: 10 mg via ORAL
  Filled 2023-07-14: qty 1

## 2023-07-14 MED ORDER — SODIUM CHLORIDE 0.9 % IV SOLN
20.0000 mg/m2 | Freq: Once | INTRAVENOUS | Status: AC
  Administered 2023-07-14: 35 mg via INTRAVENOUS
  Filled 2023-07-14: qty 7

## 2023-07-14 MED ORDER — SODIUM CHLORIDE 0.9 % IV SOLN
INTRAVENOUS | Status: DC
  Filled 2023-07-14: qty 250

## 2023-07-14 MED ORDER — SODIUM CHLORIDE 0.9% FLUSH
10.0000 mL | INTRAVENOUS | Status: DC | PRN
Start: 2023-07-14 — End: 2023-07-14
  Filled 2023-07-14: qty 10

## 2023-07-14 MED ORDER — SODIUM CHLORIDE 0.9 % IV SOLN
Freq: Once | INTRAVENOUS | Status: AC
  Filled 2023-07-14: qty 250

## 2023-07-14 NOTE — Progress Notes (Signed)
 Hematology/Oncology Consult note [facility]  [phone] [phone]  Patient Care Team: Creig Hines, MD as PCP - General (Oncology)   Name of the patient: [name]  [id number]  08/29/1955   Date of visit: 07/14/23  Diagnosis-AML  Chief complaint/ Reason for visit-on treatment assessment prior to cycle 3 of Decitabin  Heme/Onc history: Patient is a 68 year old female with a past medical history significant for GERD with no other major comorbidities who was seen at [facility] with symptoms of nausea vomiting and diarrhea.  Labs showed symptomatic anemia significant thrombocytopenia and a white cell count of 33.  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  64% blasts on manual aspirate differential.  Karyotype was normal.  SF 3 B1, TET 2, R UN X1, P HF 6 and FLT3 mutations were detected.     She was seen by Dr. Bradly Bienenstock from [facility] and patient did not desire intensive induction chemotherapy and consideration for transplant.  He gave her various treatment options including metronomic therapy with low-dose weekly decitabine plus venetoclax versus azacitidine plus venetoclax versus single agent hypomethylating agent such as azacitidine or decitabine alone.  However patient declined treatment and she was concerned about travel to [facility] for treatment.    Decitabin monotherapy was started on 05/12/2023.  However patient continued to have persistent leukocytosis with concerns for increasing blasts.  She was therefore sent back to [facility] for consideration of cycle 2 with for decitabine and venetoclax.  She underwent venetoclax ramp-up as an inpatient.  Interval history-patient was [facility] for symptoms of acute hypoxic respiratory failure and was treated for healthcare associated pneumonia.  Presently pain feels significantly better since her  hospital discharge.  She is no longer on oxygen.  She will be completing her antibiotic course tomorrow.  ECOG PS- 1 Pain scale- 0   Review of systems- Review of Systems  Constitutional:  Negative for chills, fever, malaise/fatigue and weight loss.  HENT:  Negative for congestion, ear discharge and nosebleeds.   Eyes:  Negative for blurred vision.  Respiratory:  Negative for cough, hemoptysis, sputum production, shortness of breath and wheezing.   Cardiovascular:  Negative for chest pain, palpitations, orthopnea and claudication.  Gastrointestinal:  Negative for abdominal pain, blood in stool, constipation, diarrhea, heartburn, melena, nausea and vomiting.  Genitourinary:  Negative for dysuria, flank pain, frequency, hematuria and urgency.  Musculoskeletal:  Negative for back pain, joint pain and myalgias.  Skin:  Negative for rash.  Neurological:  Negative for dizziness, tingling, focal weakness, seizures, weakness and headaches.  Endo/Heme/Allergies:  Does not bruise/bleed easily.  Psychiatric/Behavioral:  Negative for depression and suicidal ideas. The patient does not have insomnia.       Allergies  Allergen Reactions   Penicillin G Anaphylaxis   Prednisone Anaphylaxis    Pt states it turned her into a monster, lips started swelling and couldn't breath   Codeine    Heparin      Past Medical History:  Diagnosis Date   GERD (gastroesophageal reflux disease)    Leukemia (HCC)      Past Surgical History:  Procedure Laterality Date   CARDIAC SURGERY     CHOLECYSTECTOMY     HIP ARTHROSCOPY     IR IMAGING GUIDED PORT INSERTION  05/19/2023   surgery on left foot Left     Social History   Socioeconomic History  Marital status: Widowed    Spouse name: Not on file   Number of children: Not on file   Years of education: Not on file   Highest education level: Not on file  Occupational History   Not on file  Tobacco Use   Smoking status: Every Day    Current  packs/day: 0.25    Average packs/day: 0.3 packs/day for 4.0 years (1.0 ttl pk-yrs)    Types: Cigarettes   Smokeless tobacco: Never  Vaping Use   Vaping status: Never Used  Substance and Sexual Activity   Alcohol use: Not Currently   Drug use: Not Currently   Sexual activity: Not Currently  Other Topics Concern   Not on file  Social History Narrative   Not on file   Social Drivers of Health   Financial Resource Strain: Low Risk  (06/05/2023)   Received from [facility]   Overall Financial Resource Strain (CARDIA)    Difficulty of Paying Living Expenses: Not very hard  Food Insecurity: No Food Insecurity (07/05/2023)   Hunger Vital Sign    Worried About Running Out of Food in the Last Year: Never true    Ran Out of Food in the Last Year: Never true  Transportation Needs: No Transportation Needs (07/05/2023)   PRAPARE - Administrator, Civil Service (Medical): No    Lack of Transportation (Non-Medical): No  Physical Activity: Insufficiently Active (03/22/2021)   Exercise Vital Sign    Days of Exercise per Week: 2 days    Minutes of Exercise per Session: 10 min  Stress: No Stress Concern Present (03/22/2021)   [employer] of Occupational Health - Occupational Stress Questionnaire    Feeling of Stress : Not at all  Social Connections: Socially Isolated (07/05/2023)   Social Connection and Isolation Panel [NHANES]    Frequency of Communication with Friends and Family: More than three times a week    Frequency of Social Gatherings with Friends and Family: Once a week    Attends Religious Services: Never    Database administrator or Organizations: No    Attends Banker Meetings: Never    Marital Status: Widowed  Intimate Partner Violence: Not At Risk (07/05/2023)   Humiliation, Afraid, Rape, and Kick questionnaire    Fear of Current or Ex-Partner: No    Emotionally Abused: No    Physically Abused: No    Sexually Abused: No    Family History   Problem Relation Age of Onset   Cancer Mother    Heart failure Father    Heart failure Sister    Cancer Maternal Grandmother    Cancer Maternal Grandfather      Current Outpatient Medications:    albuterol (PROVENTIL) (2.5 MG/3ML) 0.083% nebulizer solution, Inhale 3 mLs (2.5 mg total) into the lungs every 4 (four) hours as needed for wheezing or shortness of breath., Disp: 75 mL, Rfl: 12   allopurinol (ZYLOPRIM) 300 MG tablet, Take 1 tablet (300 mg total) by mouth daily., Disp: 30 tablet, Rfl: 2   ALPRAZolam (XANAX) 0.5 MG tablet, Take 0.5 mg by mouth at bedtime as needed for sleep., Disp: , Rfl:    ascorbic acid (VITAMIN C) 500 MG tablet, Take 500 mg by mouth daily., Disp: , Rfl:    b complex vitamins capsule, Take 1 capsule by mouth daily., Disp: , Rfl:    cholecalciferol (VITAMIN D3) 10 MCG (400 UNIT) TABS tablet, Take 400 Units by mouth., Disp: , Rfl:  guaiFENesin (MUCINEX) 600 MG 12 hr tablet, Take 2 tablets (1,200 mg total) by mouth 2 (two) times daily., Disp: 10 tablet, Rfl: 0   hydroxyurea (HYDREA) 500 MG capsule, Take 1,000 mg by mouth 2 (two) times daily. May take with food to minimize GI side effects., Disp: , Rfl:    hydrOXYzine (ATARAX) 25 MG tablet, Take 25 mg by mouth 3 (three) times daily as needed for anxiety., Disp: , Rfl:    lidocaine-prilocaine (EMLA) cream, Apply 1 Application topically as needed., Disp: 30 g, Rfl: 0   nicotine (NICODERM CQ - DOSED IN MG/24 HOURS) 21 mg/24hr patch, Place 1 patch (21 mg total) onto the skin daily., Disp: 28 patch, Rfl: 0   OLANZapine (ZYPREXA) 5 MG tablet, Take 5 mg by mouth at bedtime., Disp: , Rfl:    oxyCODONE-acetaminophen (PERCOCET) 5-325 MG tablet, Take 1 tablet by mouth every 4 (four) hours as needed for severe pain (pain score 7-10)., Disp: 20 tablet, Rfl: 0   pantoprazole (PROTONIX) 20 MG tablet, Take 1 tablet (20 mg total) by mouth daily., Disp: 30 tablet, Rfl: 0   polyethylene glycol (MIRALAX / GLYCOLAX) 17 g packet, Take 17  g by mouth daily as needed for mild constipation., Disp: , Rfl:    prochlorperazine (COMPAZINE) 10 MG tablet, Take 1 tablet (10 mg total) by mouth every 6 (six) hours as needed for nausea or vomiting., Disp: 30 tablet, Rfl: 1   valACYclovir (VALTREX) 500 MG tablet, Take 1 tablet (500 mg total) by mouth 2 (two) times daily., Disp: 60 tablet, Rfl: 1   venetoclax (VENCLEXTA) 100 MG tablet, Take 4 tablets (400 mg total) by mouth once weekly on day 1, 8, 15, and 22 of each cycle. Take with a meal and water. Do not chew, crush, or break tablets., Disp: , Rfl:    voriconazole (VFEND) 200 MG tablet, Take 200 mg by mouth every 12 (twelve) hours., Disp: , Rfl:   Physical exam:  Vitals:   07/14/23 1324  BP: (!) 98/58  Pulse: 98  Resp: 19  Temp: 97.6 F (36.4 C)  TempSrc: Tympanic  SpO2: 94%  Weight: 142 lb 12.8 oz (64.8 kg)  Height: 5\' 5"  (1.651 m)   Physical Exam Cardiovascular:     Rate and Rhythm: Normal rate and regular rhythm.     Heart sounds: Normal heart sounds.  Pulmonary:     Effort: Pulmonary effort is normal.     Comments: Bibasilar crackles Abdominal:     General: Bowel sounds are normal.     Palpations: Abdomen is soft.  Skin:    General: Skin is warm and dry.  Neurological:     Mental Status: She is alert and oriented to person, place, and time.      I have personally reviewed labs listed below:    Latest Ref Rng & Units 07/14/2023   12:57 PM  CMP  Glucose 70 - 99 mg/dL 811   BUN 8 - 23 mg/dL 15   Creatinine 9.14 - 1.00 mg/dL 7.82   Sodium 956 - 213 mmol/L 125   Potassium 3.5 - 5.1 mmol/L 3.5   Chloride 98 - 111 mmol/L 96   CO2 22 - 32 mmol/L 23   Calcium 8.9 - 10.3 mg/dL 8.5   Total Protein 6.5 - 8.1 g/dL 6.6   Total Bilirubin 0.0 - 1.2 mg/dL 1.0   Alkaline Phos 38 - 126 U/L 82   AST 15 - 41 U/L 28   ALT 0 - 44  U/L 22       Latest Ref Rng & Units 07/14/2023   12:57 PM  CBC  WBC 4.0 - 10.5 K/uL 4.7   Hemoglobin 12.0 - 15.0 g/dL 9.1   Hematocrit 16.1 - 46.0  % 28.0   Platelets 150 - 400 K/uL 30    I have personally reviewed Radiology images listed below: No images are attached to the encounter.  CT Angio Chest PE W/Cm &/Or Wo Cm Result Date: 07/04/2023 CLINICAL DATA:  Pulmonary embolism (PE) suspected, high prob. History of leukemia EXAM: CT ANGIOGRAPHY CHEST WITH CONTRAST TECHNIQUE: Multidetector CT imaging of the chest was performed using the standard protocol during bolus administration of intravenous contrast. Multiplanar CT image reconstructions and MIPs were obtained to evaluate the vascular anatomy. RADIATION DOSE REDUCTION: This exam was performed according to the departmental dose-optimization program which includes automated exposure control, adjustment of the mA and/or kV according to patient size and/or use of iterative reconstruction technique. CONTRAST:  75mL OMNIPAQUE IOHEXOL 350 MG/ML SOLN COMPARISON:  05/17/2023, 10/30/2004 FINDINGS: Cardiovascular: Satisfactory opacification of the pulmonary arteries to the segmental level. No evidence of pulmonary embolism. Thoracic aorta is nonaneurysmal. Scattered atherosclerotic vascular calcifications of the aorta and coronary arteries. Mild cardiomegaly. No pericardial effusion. Right internal jugular approach chest port terminates at the proximal right atrium. Mediastinum/Nodes: No axillary lymphadenopathy. Multiple enlarged mediastinal and bilateral hilar lymph nodes. Reference nodes include 1.2 cm right paratracheal (series 5, image 33), 1.5 cm subcarinal (series 5, image 53), 1.5 cm right hilar (series 5, image 57), and 1.5 cm left hilar (series 5, image 60). Thyroid gland, trachea, and esophagus demonstrate no significant abnormality. Tiny hiatal hernia. Lungs/Pleura: Extensive patchy airspace opacities bilaterally, most pronounced within the mid to lower lung zones bilaterally and with a slightly peripheral-predominant distribution. No pleural effusion. No pneumothorax. Upper Abdomen: Splenomegaly.  No acute abnormality within the included upper abdomen. Musculoskeletal: No significant bony or chest wall abnormality. Review of the MIP images confirms the above findings. IMPRESSION: 1. No evidence of pulmonary embolism. 2. Extensive patchy airspace opacities bilaterally, most pronounced within the mid to lower lung zones bilaterally and with a slightly peripheral-predominant distribution. Findings are most compatible with multifocal pneumonia, including atypical/viral etiologies. Short-term follow-up imaging after appropriate treatment is recommended to document resolution. 3. Mediastinal and bilateral hilar lymphadenopathy, which may be reactive or related to patient's history of leukemia. 4. Splenomegaly. 5. Aortic and coronary artery atherosclerosis (ICD10-I70.0). Electronically Signed   By: Duanne Guess D.O.   On: 07/04/2023 17:43   DG Chest 2 View Result Date: 07/04/2023 CLINICAL DATA:  Shortness of breath and chest pain. EXAM: CHEST - 2 VIEW COMPARISON:  Chest radiograph dated 03/03/2023. FINDINGS: The heart size and mediastinal contours are within normal limits. Right chest Port-A-Cath tip overlies the lower SVC. Multifocal lingular and bibasilar opacities, more pronounced on the left. No pleural effusion or pneumothorax. No acute osseous abnormality. IMPRESSION: Multifocal bibasilar and lingular opacities, more pronounced on the left, concerning for multifocal pneumonia. Followup PA and lateral chest X-ray is recommended in 3-4 weeks following trial of antibiotic therapy to monitor for resolution. Electronically Signed   By: Hart Robinsons M.D.   On: 07/04/2023 16:05     Assessment and plan- Patient is a 69 y.o. female With history of AML with FLT3, PHF6, RUN X1, SF 3 B1 and TET 2 mutation.  She is here for on treatment assessment prior to cycle 3 of decitabine with venetoclax.  White cell count is 4.7 today with  an ANC of 0.6.  Platelets at 30.  Okay to proceed with Decitabin today.   Pancytopenia secondary to leukemia more likely than because of treatment.  I am also reaching out to [facility] and patient will start venetoclax today.  She will restart Valtrex prophylaxis at this time.  Decision for antifungal prophylaxis will be deferred to [facility] and I am currently awaiting their recommendations.  I will see her on a weekly basis for possible bladder platelet transfusion.  I will see her back in 2 weeks to see how she is doing in 4 weeks for her next treatment.  Hyponatremia/hypochloremia: She will receive 1 L of IV fluids today.   Visit Diagnosis 1. Acute myeloid leukemia not having achieved remission (HCC)      Dr. Owens Shark, MD, MPH [facility] [phone] 07/14/2023 1:10 PM

## 2023-07-14 NOTE — Addendum Note (Signed)
 Addended by: Rhona Leavens on: 07/14/2023 02:10 PM   Modules accepted: Orders

## 2023-07-14 NOTE — Patient Instructions (Signed)
 [facility] - A DEPT OF [facility]  Discharge Instructions: Thank you for choosing [facility] to provide your oncology and hematology care.  If you have a lab appointment with the Cancer Center, please go directly to the Cancer Center and check in at the registration area.  Wear comfortable clothing and clothing appropriate for easy access to any Portacath or PICC line.   We strive to give you quality time with your provider. You may need to reschedule your appointment if you arrive late (15 or more minutes).  Arriving late affects you and other patients whose appointments are after yours.  Also, if you miss three or more appointments without notifying the office, you may be dismissed from the clinic at the provider's discretion.      For prescription refill requests, have your pharmacy contact our office and allow 72 hours for refills to be completed.    Today you received the following chemotherapy and/or immunotherapy agents Dacogen      To help prevent nausea and vomiting after your treatment, we encourage you to take your nausea medication as directed.  BELOW ARE SYMPTOMS THAT SHOULD BE REPORTED IMMEDIATELY: *FEVER GREATER THAN 100.4 F (38 C) OR HIGHER *CHILLS OR SWEATING *NAUSEA AND VOMITING THAT IS NOT CONTROLLED WITH YOUR NAUSEA MEDICATION *UNUSUAL SHORTNESS OF BREATH *UNUSUAL BRUISING OR BLEEDING *URINARY PROBLEMS (pain or burning when urinating, or frequent urination) *BOWEL PROBLEMS (unusual diarrhea, constipation, pain near the anus) TENDERNESS IN MOUTH AND THROAT WITH OR WITHOUT PRESENCE OF ULCERS (sore throat, sores in mouth, or a toothache) UNUSUAL RASH, SWELLING OR PAIN  UNUSUAL VAGINAL DISCHARGE OR ITCHING   Items with * indicate a potential emergency and should be followed up as soon as possible or go to the Emergency Department if any problems should occur.  Please show the CHEMOTHERAPY ALERT CARD or IMMUNOTHERAPY  ALERT CARD at check-in to the Emergency Department and triage nurse.  Should you have questions after your visit or need to cancel or reschedule your appointment, please contact [facility] - A DEPT OF [facility]  [phone] and follow the prompts.  Office hours are 8:00 a.m. to 4:30 p.m. Monday - Friday. Please note that voicemails left after 4:00 p.m. may not be returned until the following business day.  We are closed weekends and major holidays. You have access to a nurse at all times for urgent questions. Please call the main number to the clinic [phone] and follow the prompts.  For any non-urgent questions, you may also contact your provider using MyChart. We now offer e-Visits for anyone 9 and older to request care online for non-urgent symptoms. For details visit [email or web address].   Also download the MyChart app! Go to the app store, search "MyChart", open the app, select [facility], and log in with your MyChart username and password.

## 2023-07-15 ENCOUNTER — Inpatient Hospital Stay

## 2023-07-15 ENCOUNTER — Other Ambulatory Visit: Payer: Self-pay

## 2023-07-15 VITALS — BP 117/56 | HR 99 | Temp 97.3°F | Resp 18

## 2023-07-15 DIAGNOSIS — Z5111 Encounter for antineoplastic chemotherapy: Secondary | ICD-10-CM | POA: Diagnosis not present

## 2023-07-15 DIAGNOSIS — C9200 Acute myeloblastic leukemia, not having achieved remission: Secondary | ICD-10-CM

## 2023-07-15 MED ORDER — SODIUM CHLORIDE 0.9 % IV SOLN
INTRAVENOUS | Status: DC
  Filled 2023-07-15: qty 250

## 2023-07-15 MED ORDER — SODIUM CHLORIDE 0.9 % IV SOLN
20.0000 mg/m2 | Freq: Once | INTRAVENOUS | Status: AC
  Administered 2023-07-15: 35 mg via INTRAVENOUS
  Filled 2023-07-15: qty 7

## 2023-07-15 MED ORDER — PROCHLORPERAZINE MALEATE 10 MG PO TABS
10.0000 mg | ORAL_TABLET | Freq: Once | ORAL | Status: AC
  Administered 2023-07-15: 10 mg via ORAL
  Filled 2023-07-15: qty 1

## 2023-07-16 ENCOUNTER — Telehealth: Payer: Self-pay | Admitting: *Deleted

## 2023-07-16 ENCOUNTER — Other Ambulatory Visit: Payer: Self-pay

## 2023-07-16 ENCOUNTER — Encounter: Payer: Self-pay | Admitting: Oncology

## 2023-07-16 ENCOUNTER — Other Ambulatory Visit: Payer: Self-pay | Admitting: Nurse Practitioner

## 2023-07-16 ENCOUNTER — Inpatient Hospital Stay

## 2023-07-16 VITALS — BP 122/56 | HR 89 | Temp 97.8°F | Resp 18

## 2023-07-16 DIAGNOSIS — K521 Toxic gastroenteritis and colitis: Secondary | ICD-10-CM

## 2023-07-16 DIAGNOSIS — Z5111 Encounter for antineoplastic chemotherapy: Secondary | ICD-10-CM | POA: Diagnosis not present

## 2023-07-16 DIAGNOSIS — C9200 Acute myeloblastic leukemia, not having achieved remission: Secondary | ICD-10-CM

## 2023-07-16 MED ORDER — ACETAMINOPHEN 325 MG PO TABS
650.0000 mg | ORAL_TABLET | Freq: Once | ORAL | Status: AC
  Administered 2023-07-16: 650 mg via ORAL

## 2023-07-16 MED ORDER — PROCHLORPERAZINE EDISYLATE 10 MG/2ML IJ SOLN
10.0000 mg | Freq: Once | INTRAMUSCULAR | Status: AC | PRN
  Administered 2023-07-16: 10 mg via INTRAVENOUS
  Filled 2023-07-16 (×2): qty 2

## 2023-07-16 MED ORDER — HEPARIN SOD (PORK) LOCK FLUSH 100 UNIT/ML IV SOLN
500.0000 [IU] | Freq: Once | INTRAVENOUS | Status: DC
  Filled 2023-07-16: qty 5

## 2023-07-16 MED ORDER — LOPERAMIDE HCL 2 MG PO CAPS
4.0000 mg | ORAL_CAPSULE | Freq: Once | ORAL | Status: AC
Start: 2023-07-16 — End: ?

## 2023-07-16 MED ORDER — LOPERAMIDE HCL 2 MG PO CAPS: 4.0000 mg | ORAL_CAPSULE | Freq: Once | ORAL | Status: DC

## 2023-07-16 MED ORDER — LOPERAMIDE HCL 2 MG PO CAPS
4.0000 mg | ORAL_CAPSULE | Freq: Once | ORAL | Status: AC
  Administered 2023-07-16: 4 mg via ORAL
  Filled 2023-07-16: qty 2

## 2023-07-16 MED ORDER — SODIUM CHLORIDE 0.9 % IV SOLN
Freq: Once | INTRAVENOUS | Status: AC
  Filled 2023-07-16: qty 250

## 2023-07-16 NOTE — Telephone Encounter (Signed)
 The patient is vomiting, can't even to keep ginger ale down. Also she is having a lot of diarrhea. She told her friend that she can't come in. Cancel the appts today, the patient took zofran and it is staying down for now. She was sipping on gatorade but very little at at time , diarrhea liquid and color yellow.

## 2023-07-17 ENCOUNTER — Encounter: Payer: Self-pay | Admitting: Oncology

## 2023-07-17 ENCOUNTER — Inpatient Hospital Stay

## 2023-07-17 VITALS — BP 120/53 | HR 86 | Temp 96.9°F | Resp 18

## 2023-07-17 DIAGNOSIS — C9200 Acute myeloblastic leukemia, not having achieved remission: Secondary | ICD-10-CM

## 2023-07-17 DIAGNOSIS — Z5111 Encounter for antineoplastic chemotherapy: Secondary | ICD-10-CM | POA: Diagnosis not present

## 2023-07-17 DIAGNOSIS — T451X5A Adverse effect of antineoplastic and immunosuppressive drugs, initial encounter: Secondary | ICD-10-CM

## 2023-07-17 MED ORDER — SODIUM CHLORIDE 0.9 % IV SOLN
Freq: Once | INTRAVENOUS | Status: AC
  Filled 2023-07-17: qty 250

## 2023-07-17 MED ORDER — SODIUM CHLORIDE 0.9% FLUSH
10.0000 mL | INTRAVENOUS | Status: DC | PRN
  Filled 2023-07-17: qty 10

## 2023-07-17 MED ORDER — PROCHLORPERAZINE MALEATE 10 MG PO TABS: 10.0000 mg | ORAL_TABLET | Freq: Once | ORAL | Status: DC

## 2023-07-17 MED ORDER — SODIUM CHLORIDE 0.9 % IV SOLN
20.0000 mg/m2 | Freq: Once | INTRAVENOUS | Status: AC
  Administered 2023-07-17: 35 mg via INTRAVENOUS
  Filled 2023-07-17: qty 7

## 2023-07-17 MED ORDER — SODIUM CHLORIDE 0.9 % IV SOLN
20.0000 mg/m2 | Freq: Once | INTRAVENOUS | Status: DC
  Filled 2023-07-17: qty 7

## 2023-07-17 MED ORDER — SODIUM CHLORIDE 0.9 % IV SOLN
INTRAVENOUS | Status: DC
  Filled 2023-07-17: qty 250

## 2023-07-17 NOTE — Progress Notes (Signed)
 Patient here for dacogen treatment today. She did not get her treatment yesterday due to nausea, vomiting and diarrhea. Patient requested additional fluids today due to weakness. 1 Liter of NS given over 1 hour with dacogen treatment today, per Consuello Masse NP. Patient's symptoms of weakness improved after fluids.

## 2023-07-18 ENCOUNTER — Inpatient Hospital Stay

## 2023-07-18 ENCOUNTER — Ambulatory Visit

## 2023-07-18 VITALS — BP 106/51 | HR 93 | Temp 97.0°F | Resp 16

## 2023-07-18 DIAGNOSIS — Z5111 Encounter for antineoplastic chemotherapy: Secondary | ICD-10-CM | POA: Diagnosis not present

## 2023-07-18 DIAGNOSIS — R197 Diarrhea, unspecified: Secondary | ICD-10-CM

## 2023-07-18 DIAGNOSIS — C9200 Acute myeloblastic leukemia, not having achieved remission: Secondary | ICD-10-CM

## 2023-07-18 MED ORDER — PROCHLORPERAZINE MALEATE 10 MG PO TABS: 10.0000 mg | ORAL_TABLET | Freq: Once | ORAL | Status: DC

## 2023-07-18 MED ORDER — SODIUM CHLORIDE 0.9 % IV SOLN
INTRAVENOUS | Status: DC
  Filled 2023-07-18: qty 250

## 2023-07-18 MED ORDER — SODIUM CHLORIDE 0.9 % IV SOLN
20.0000 mg/m2 | Freq: Once | INTRAVENOUS | Status: AC
  Administered 2023-07-18: 35 mg via INTRAVENOUS
  Filled 2023-07-18: qty 7

## 2023-07-18 MED ORDER — SODIUM CHLORIDE 0.9 % IV SOLN
Freq: Once | INTRAVENOUS | Status: AC
  Filled 2023-07-18: qty 250

## 2023-07-21 ENCOUNTER — Other Ambulatory Visit: Payer: Self-pay | Admitting: Oncology

## 2023-07-23 ENCOUNTER — Encounter: Payer: Self-pay | Admitting: Oncology

## 2023-07-23 ENCOUNTER — Other Ambulatory Visit: Payer: Self-pay

## 2023-07-23 DIAGNOSIS — J189 Pneumonia, unspecified organism: Secondary | ICD-10-CM

## 2023-07-23 DIAGNOSIS — C9200 Acute myeloblastic leukemia, not having achieved remission: Secondary | ICD-10-CM

## 2023-07-29 NOTE — Addendum Note (Signed)
 Encounter addended by: Karolynn Pack on: 07/29/2023 9:17 AM  Actions taken: Imaging Exam ended

## 2023-08-08 ENCOUNTER — Other Ambulatory Visit: Payer: Self-pay | Admitting: Oncology

## 2023-08-08 ENCOUNTER — Telehealth: Payer: Self-pay | Admitting: *Deleted

## 2023-08-08 DIAGNOSIS — C9200 Acute myeloblastic leukemia, not having achieved remission: Secondary | ICD-10-CM

## 2023-08-08 NOTE — Telephone Encounter (Signed)
 Yes I am aware. I will see her on 5/5 though as planned but cancel her rx

## 2023-08-08 NOTE — Telephone Encounter (Signed)
 Dr. Gaylyn Keas is called and told the patient not to get the treatment until she gets another bone marrow biopsy.  So the patient is saying we need to cancel May 5 through May 19 until the bone marrow biopsy comes back

## 2023-08-09 ENCOUNTER — Other Ambulatory Visit: Payer: Self-pay

## 2023-08-11 ENCOUNTER — Inpatient Hospital Stay

## 2023-08-11 ENCOUNTER — Other Ambulatory Visit: Payer: Self-pay

## 2023-08-11 ENCOUNTER — Encounter: Payer: Self-pay | Admitting: Oncology

## 2023-08-11 ENCOUNTER — Inpatient Hospital Stay: Admitting: Oncology

## 2023-08-11 DIAGNOSIS — C9200 Acute myeloblastic leukemia, not having achieved remission: Secondary | ICD-10-CM

## 2023-08-12 ENCOUNTER — Inpatient Hospital Stay

## 2023-08-13 ENCOUNTER — Ambulatory Visit

## 2023-08-13 ENCOUNTER — Inpatient Hospital Stay

## 2023-08-14 ENCOUNTER — Ambulatory Visit

## 2023-08-15 ENCOUNTER — Ambulatory Visit

## 2023-08-19 ENCOUNTER — Encounter: Payer: Self-pay | Admitting: Oncology

## 2023-08-22 ENCOUNTER — Telehealth: Payer: Self-pay | Admitting: Oncology

## 2023-08-22 ENCOUNTER — Other Ambulatory Visit: Payer: Self-pay | Admitting: Oncology

## 2023-08-22 DIAGNOSIS — C9200 Acute myeloblastic leukemia, not having achieved remission: Secondary | ICD-10-CM

## 2023-08-22 NOTE — Telephone Encounter (Signed)
 pt has ct and bone marrow in [place], is canceling appts for the week of 5/19-5/23 until she has results back. Appts canceled and noted.

## 2023-08-25 ENCOUNTER — Inpatient Hospital Stay

## 2023-08-25 ENCOUNTER — Inpatient Hospital Stay: Admitting: Oncology

## 2023-08-25 ENCOUNTER — Inpatient Hospital Stay: Attending: Oncology

## 2023-08-26 ENCOUNTER — Inpatient Hospital Stay

## 2023-08-27 ENCOUNTER — Ambulatory Visit

## 2023-08-28 ENCOUNTER — Ambulatory Visit

## 2023-08-29 ENCOUNTER — Ambulatory Visit

## 2023-09-19 ENCOUNTER — Other Ambulatory Visit: Payer: Self-pay

## 2023-09-24 ENCOUNTER — Inpatient Hospital Stay (HOSPITAL_BASED_OUTPATIENT_CLINIC_OR_DEPARTMENT_OTHER)
Admission: EM | Admit: 2023-09-24 | Discharge: 2023-09-26 | DRG: 187 | Disposition: A | Discharge status: Home / Self Care | Attending: Family Medicine | Admitting: Family Medicine

## 2023-09-24 ENCOUNTER — Other Ambulatory Visit: Payer: Self-pay

## 2023-09-24 ENCOUNTER — Emergency Department (HOSPITAL_BASED_OUTPATIENT_CLINIC_OR_DEPARTMENT_OTHER)

## 2023-09-24 ENCOUNTER — Encounter (HOSPITAL_BASED_OUTPATIENT_CLINIC_OR_DEPARTMENT_OTHER): Payer: Self-pay | Admitting: Emergency Medicine

## 2023-09-24 ENCOUNTER — Emergency Department (HOSPITAL_BASED_OUTPATIENT_CLINIC_OR_DEPARTMENT_OTHER): Admitting: Radiology

## 2023-09-24 DIAGNOSIS — D696 Thrombocytopenia, unspecified: Principal | ICD-10-CM | POA: Diagnosis present

## 2023-09-24 DIAGNOSIS — Z1152 Encounter for screening for COVID-19: Secondary | ICD-10-CM | POA: Diagnosis not present

## 2023-09-24 DIAGNOSIS — D649 Anemia, unspecified: Secondary | ICD-10-CM | POA: Diagnosis present

## 2023-09-24 DIAGNOSIS — Z8701 Personal history of pneumonia (recurrent): Secondary | ICD-10-CM | POA: Diagnosis not present

## 2023-09-24 DIAGNOSIS — D630 Anemia in neoplastic disease: Secondary | ICD-10-CM | POA: Diagnosis present

## 2023-09-24 DIAGNOSIS — J9811 Atelectasis: Secondary | ICD-10-CM | POA: Diagnosis present

## 2023-09-24 DIAGNOSIS — E871 Hypo-osmolality and hyponatremia: Secondary | ICD-10-CM | POA: Diagnosis present

## 2023-09-24 DIAGNOSIS — Z880 Allergy status to penicillin: Secondary | ICD-10-CM | POA: Diagnosis not present

## 2023-09-24 DIAGNOSIS — K219 Gastro-esophageal reflux disease without esophagitis: Secondary | ICD-10-CM | POA: Diagnosis present

## 2023-09-24 DIAGNOSIS — F17210 Nicotine dependence, cigarettes, uncomplicated: Secondary | ICD-10-CM | POA: Diagnosis present

## 2023-09-24 DIAGNOSIS — Z888 Allergy status to other drugs, medicaments and biological substances status: Secondary | ICD-10-CM | POA: Diagnosis not present

## 2023-09-24 DIAGNOSIS — Z9221 Personal history of antineoplastic chemotherapy: Secondary | ICD-10-CM | POA: Diagnosis not present

## 2023-09-24 DIAGNOSIS — C9200 Acute myeloblastic leukemia, not having achieved remission: Secondary | ICD-10-CM | POA: Diagnosis present

## 2023-09-24 DIAGNOSIS — Z8249 Family history of ischemic heart disease and other diseases of the circulatory system: Secondary | ICD-10-CM

## 2023-09-24 DIAGNOSIS — Z79899 Other long term (current) drug therapy: Secondary | ICD-10-CM

## 2023-09-24 DIAGNOSIS — D6959 Other secondary thrombocytopenia: Secondary | ICD-10-CM | POA: Diagnosis present

## 2023-09-24 DIAGNOSIS — J90 Pleural effusion, not elsewhere classified: Principal | ICD-10-CM | POA: Diagnosis present

## 2023-09-24 DIAGNOSIS — Z885 Allergy status to narcotic agent status: Secondary | ICD-10-CM | POA: Diagnosis not present

## 2023-09-24 DIAGNOSIS — D61818 Other pancytopenia: Secondary | ICD-10-CM | POA: Diagnosis present

## 2023-09-24 DIAGNOSIS — C4492 Squamous cell carcinoma of skin, unspecified: Secondary | ICD-10-CM | POA: Diagnosis present

## 2023-09-24 LAB — CBC
HCT: 22.7 % — ABNORMAL LOW (ref 36.0–46.0)
Hemoglobin: 7.6 g/dL — ABNORMAL LOW (ref 12.0–15.0)
MCH: 28.6 pg (ref 26.0–34.0)
MCHC: 33.5 g/dL (ref 30.0–36.0)
MCV: 85.3 fL (ref 80.0–100.0)
Platelets: 12 10*3/uL — CL (ref 150–400)
RBC: 2.66 MIL/uL — ABNORMAL LOW (ref 3.87–5.11)
RDW: 20.1 % — ABNORMAL HIGH (ref 11.5–15.5)
WBC: 4.7 10*3/uL (ref 4.0–10.5)
nRBC: 0 % (ref 0.0–0.2)

## 2023-09-24 LAB — COMPREHENSIVE METABOLIC PANEL WITH GFR
ALT: 23 U/L (ref 0–44)
AST: 21 U/L (ref 15–41)
Albumin: 3.9 g/dL (ref 3.5–5.0)
Alkaline Phosphatase: 114 U/L (ref 38–126)
Anion gap: 12 (ref 5–15)
BUN: 16 mg/dL (ref 8–23)
CO2: 23 mmol/L (ref 22–32)
Calcium: 9.1 mg/dL (ref 8.9–10.3)
Chloride: 94 mmol/L — ABNORMAL LOW (ref 98–111)
Creatinine, Ser: 0.65 mg/dL (ref 0.44–1.00)
GFR, Estimated: >60 mL/min (ref >60)
Glucose, Bld: 99 mg/dL (ref 70–99)
Potassium: 4.3 mmol/L (ref 3.5–5.1)
Sodium: 129 mmol/L — ABNORMAL LOW (ref 135–145)
Total Bilirubin: 0.7 mg/dL (ref 0.0–1.2)
Total Protein: 6.8 g/dL (ref 6.5–8.1)

## 2023-09-24 LAB — TROPONIN T, HIGH SENSITIVITY
Troponin T High Sensitivity: <15 ng/L (ref <19)
Troponin T High Sensitivity: <15 ng/L (ref <19)

## 2023-09-24 LAB — HEMOGLOBIN AND HEMATOCRIT, BLOOD
HCT: 22.2 % — ABNORMAL LOW (ref 36.0–46.0)
Hemoglobin: 7.5 g/dL — ABNORMAL LOW (ref 12.0–15.0)

## 2023-09-24 LAB — LIPASE, BLOOD: Lipase: 24 U/L (ref 11–51)

## 2023-09-24 MED ORDER — NICOTINE 21 MG/24HR TD PT24
21.0000 mg | MEDICATED_PATCH | Freq: Every day | TRANSDERMAL | Status: DC
  Administered 2023-09-25 – 2023-09-26 (×2): 21 mg via TRANSDERMAL
  Filled 2023-09-24 (×2): qty 1

## 2023-09-24 MED ORDER — ALPRAZOLAM 0.5 MG PO TABS
0.5000 mg | ORAL_TABLET | Freq: Every evening | ORAL | Status: DC | PRN
  Administered 2023-09-24 – 2023-09-25 (×2): 0.5 mg via ORAL
  Filled 2023-09-24 (×2): qty 1

## 2023-09-24 MED ORDER — IOHEXOL 350 MG/ML SOLN
100.0000 mL | Freq: Once | INTRAVENOUS | Status: AC | PRN
  Administered 2023-09-24: 80 mL via INTRAVENOUS

## 2023-09-24 MED ORDER — ALBUTEROL SULFATE (2.5 MG/3ML) 0.083% IN NEBU: 2.5000 mg | INHALATION_SOLUTION | RESPIRATORY_TRACT | Status: DC | PRN

## 2023-09-24 MED ORDER — GUAIFENESIN ER 600 MG PO TB12
600.0000 mg | ORAL_TABLET | Freq: Two times a day (BID) | ORAL | Status: DC
  Administered 2023-09-24 – 2023-09-26 (×4): 600 mg via ORAL
  Filled 2023-09-24 (×4): qty 1

## 2023-09-24 MED ORDER — ONDANSETRON HCL 4 MG/2ML IJ SOLN
4.0000 mg | Freq: Once | INTRAMUSCULAR | Status: AC
  Administered 2023-09-24: 4 mg via INTRAVENOUS
  Filled 2023-09-24: qty 2

## 2023-09-24 MED ORDER — METOCLOPRAMIDE HCL 5 MG/ML IJ SOLN
10.0000 mg | Freq: Once | INTRAMUSCULAR | Status: AC
  Administered 2023-09-24: 10 mg via INTRAVENOUS
  Filled 2023-09-24: qty 2

## 2023-09-24 MED ORDER — SODIUM CHLORIDE 0.9% FLUSH: 10.0000 mL | INTRAVENOUS | Status: DC | PRN

## 2023-09-24 MED ORDER — ACETAMINOPHEN 650 MG RE SUPP: 650.0000 mg | Freq: Four times a day (QID) | RECTAL | Status: DC | PRN

## 2023-09-24 MED ORDER — DOXYCYCLINE HYCLATE 100 MG PO TABS
100.0000 mg | ORAL_TABLET | Freq: Two times a day (BID) | ORAL | Status: DC
  Administered 2023-09-24 – 2023-09-25 (×2): 100 mg via ORAL
  Filled 2023-09-24 (×2): qty 1

## 2023-09-24 MED ORDER — MORPHINE SULFATE (PF) 4 MG/ML IV SOLN
4.0000 mg | Freq: Once | INTRAVENOUS | Status: AC
  Administered 2023-09-24: 4 mg via INTRAVENOUS
  Filled 2023-09-24: qty 1

## 2023-09-24 MED ORDER — HYDROMORPHONE HCL 1 MG/ML IJ SOLN
1.0000 mg | Freq: Once | INTRAMUSCULAR | Status: AC
  Administered 2023-09-24: 1 mg via INTRAVENOUS
  Filled 2023-09-24: qty 1

## 2023-09-24 MED ORDER — SODIUM CHLORIDE 0.9% FLUSH
10.0000 mL | Freq: Two times a day (BID) | INTRAVENOUS | Status: DC
  Administered 2023-09-24 – 2023-09-26 (×3): 10 mL

## 2023-09-24 MED ORDER — ACETAMINOPHEN 325 MG PO TABS
650.0000 mg | ORAL_TABLET | Freq: Four times a day (QID) | ORAL | Status: DC | PRN
Start: 2023-09-24 — End: 2023-09-26
  Administered 2023-09-24 – 2023-09-26 (×4): 650 mg via ORAL
  Filled 2023-09-24 (×4): qty 2

## 2023-09-24 MED ORDER — ONDANSETRON HCL 4 MG PO TABS: 4.0000 mg | ORAL_TABLET | Freq: Four times a day (QID) | ORAL | Status: DC | PRN

## 2023-09-24 MED ORDER — PANTOPRAZOLE SODIUM 20 MG PO TBEC
20.0000 mg | DELAYED_RELEASE_TABLET | Freq: Every day | ORAL | Status: DC
  Administered 2023-09-25 – 2023-09-26 (×2): 20 mg via ORAL
  Filled 2023-09-24 (×2): qty 1

## 2023-09-24 MED ORDER — SODIUM CHLORIDE 0.9% FLUSH
3.0000 mL | Freq: Two times a day (BID) | INTRAVENOUS | Status: DC
  Administered 2023-09-25 – 2023-09-26 (×2): 3 mL via INTRAVENOUS

## 2023-09-24 MED ORDER — POLYETHYLENE GLYCOL 3350 17 G PO PACK: 17.0000 g | PACK | Freq: Every day | ORAL | Status: DC | PRN

## 2023-09-24 MED ORDER — SODIUM CHLORIDE 0.9% IV SOLUTION: Freq: Once | INTRAVENOUS | Status: AC

## 2023-09-24 MED ORDER — HYDROCODONE-ACETAMINOPHEN 5-325 MG PO TABS: 1.0000 | ORAL_TABLET | ORAL | Status: DC | PRN

## 2023-09-24 MED ORDER — ONDANSETRON HCL 4 MG/2ML IJ SOLN
4.0000 mg | Freq: Four times a day (QID) | INTRAMUSCULAR | Status: DC | PRN
  Administered 2023-09-25 – 2023-09-26 (×2): 4 mg via INTRAVENOUS
  Filled 2023-09-24 (×2): qty 2

## 2023-09-24 MED ORDER — CHLORHEXIDINE GLUCONATE CLOTH 2 % EX PADS
6.0000 | MEDICATED_PAD | Freq: Every day | CUTANEOUS | Status: DC
Start: 2023-09-25 — End: 2023-09-26
  Administered 2023-09-25: 6 via TOPICAL

## 2023-09-24 MED ORDER — TRAMADOL HCL 50 MG PO TABS
50.0000 mg | ORAL_TABLET | Freq: Four times a day (QID) | ORAL | Status: DC | PRN
  Administered 2023-09-24 – 2023-09-26 (×3): 50 mg via ORAL
  Filled 2023-09-24 (×3): qty 1

## 2023-09-24 NOTE — H&P (Signed)
 History and Physical    [name] LKG:[id number] DOB: 12-Jun-1955 DOA: 09/24/2023  PCP: Avonne Boettcher, MD  Patient coming from: Home  I have personally briefly reviewed patient's old medical records in [facility]  Chief Complaint: Right-sided pleuritic chest pain  HPI: [name] is a 68 y.o. female with medical history significant for acute myeloid leukemia, chronic anemia and thrombocytopenia, chronic hyponatremia who presented to the ED for evaluation of right-sided chest discomfort.  Patient reports 1 week of right-sided pleuritic lower chest pain.  Pain is worse with activity and deep inspiration.  She has had a nonproductive cough initially but feels like it is breaking up now.  She has had some shortness of breath which she says has improved by now.  She has not had any fevers, chills, diaphoresis.  She has had issues with persistent thrombocytopenia requiring platelet transfusions, most recently yesterday 6/17.  Her platelets improved from 15 >>20 after transfusion with her oncology team at [facility] yesterday.  She has not had any obvious bleeding.    [facility] ED Course  Labs/Imaging on admission: I have personally reviewed following labs and imaging studies.  Initial vitals showed BP 127/68, pulse 107, RR 18, temp 98.7 F, SpO2 100% room air.  Per EDP SpO2 dropped to 87-89% while on RA and patient placed on 2 L via [place] with improvement.  Labs showed WBC 4.7, hemoglobin 7.6, platelets 12, sodium 129, potassium 4.3, bicarb 23, BUN 16, creatinine 0.65, serum glucose 99, LFTs within normal limits, lipase 24, troponin T <15 x 2.  CTA chest was negative for PE.  Interval moderate-sized right pleural effusion and adjacent right lower lobe compressive atelectasis noted.  Improvement in previous bilateral patchy densities noted.  Interval decrease in size of multiple enlarged mediastinal and bilateral hilar lymph nodes also seen.  Patient was given IV Dilaudid, IV  morphine , IV Reglan, IV Zofran .  EDP discussed with [facility] oncology who recommended patient should be on 1000 mg hydroxyurea twice daily, platelet transfusion prior to thoracentesis, and that patient does not require transfer to their health system.  The hospitalist service was consulted to admit.  Review of Systems: All systems reviewed and are negative except as documented in history of present illness above.   Past Medical History:  Diagnosis Date   GERD (gastroesophageal reflux disease)    Leukemia (HCC)     Past Surgical History:  Procedure Laterality Date   CARDIAC SURGERY     CHOLECYSTECTOMY     HIP ARTHROSCOPY     IR IMAGING GUIDED PORT INSERTION  05/19/2023   surgery on left foot Left     Social History: Social History   Tobacco Use   Smoking status: Every Day    Current packs/day: 0.25    Average packs/day: 0.3 packs/day for 4.0 years (1.0 ttl pk-yrs)    Types: Cigarettes   Smokeless tobacco: Never  Vaping Use   Vaping status: Never Used  Substance Use Topics   Alcohol use: Not Currently   Drug use: Not Currently   Allergies  Allergen Reactions   Penicillin G Anaphylaxis   Prednisone Anaphylaxis    Pt states it turned her into a monster, lips started swelling and couldn't breath   Codeine    Heparin      Family History  Problem Relation Age of Onset   Cancer Mother    Heart failure Father    Heart failure Sister    Cancer Maternal Grandmother  Cancer Maternal Grandfather      Prior to Admission medications   Medication Sig Start Date End Date Taking? Authorizing Provider  cyclobenzaprine (FLEXERIL) 5 MG tablet Take 5 mg by mouth 3 (three) times daily as needed. 09/02/23  Yes [provider]  doxycycline  (MONODOX ) 100 MG capsule Take 100 mg by mouth 2 (two) times daily. 09/12/23  Yes [provider]  levofloxacin  (LEVAQUIN ) 750 MG tablet Take 750 mg by mouth daily. 09/12/23  Yes [provider]  traMADol (ULTRAM) 50 MG tablet Take  50 mg by mouth every 6 (six) hours as needed. 09/23/23 09/22/24 Yes [provider]  albuterol  (PROVENTIL ) (2.5 MG/3ML) 0.083% nebulizer solution Inhale 3 mLs (2.5 mg total) into the lungs every 4 (four) hours as needed for wheezing or shortness of breath. 07/07/23   Swayze, Ava, DO  allopurinol  (ZYLOPRIM ) 300 MG tablet Take 1 tablet (300 mg total) by mouth daily. 05/08/23   Avonne Boettcher, MD  ALPRAZolam  (XANAX ) 0.5 MG tablet Take 0.5 mg by mouth at bedtime as needed for sleep.    [provider]  ascorbic acid  (VITAMIN C ) 500 MG tablet Take 500 mg by mouth daily.    [provider]  b complex vitamins capsule Take 1 capsule by mouth daily.    [provider]  cholecalciferol  (VITAMIN D3) 10 MCG (400 UNIT) TABS tablet Take 400 Units by mouth.    [provider]  guaiFENesin  (MUCINEX ) 600 MG 12 hr tablet Take 2 tablets (1,200 mg total) by mouth 2 (two) times daily. 07/07/23   Swayze, Ava, DO  hydroxyurea (HYDREA) 500 MG capsule Take 1,000 mg by mouth 2 (two) times daily. May take with food to minimize GI side effects.    [provider]  hydrOXYzine (ATARAX) 25 MG tablet Take 25 mg by mouth 3 (three) times daily as needed for anxiety. 06/09/23   [provider]  lidocaine -prilocaine  (EMLA ) cream Apply 1 Application topically as needed. 05/20/23   Avonne Boettcher, MD  nicotine  (NICODERM CQ  - DOSED IN MG/24 HOURS) 21 mg/24hr patch Place 1 patch (21 mg total) onto the skin daily. 07/08/23   Swayze, Ava, DO  OLANZapine (ZYPREXA) 5 MG tablet Take 5 mg by mouth at bedtime. 06/25/23 07/25/23  [provider]  ondansetron  (ZOFRAN ) 8 MG tablet TAKE ONE TABLET (8 MG TOTAL) BY MOUTH EVERY EIGHT HOURS AS NEEDED FOR NAUSEA OR VOMITING. 08/11/23   Avonne Boettcher, MD  oxyCODONE -acetaminophen  (PERCOCET) 5-325 MG tablet Take 1 tablet by mouth every 4 (four) hours as needed for severe pain (pain score 7-10). 05/17/23   Ruth Cove, MD  pantoprazole   (PROTONIX ) 20 MG tablet TAKE ONE TABLET (20 MG TOTAL) BY MOUTH DAILY. 07/23/23   Rao, Archana C, MD  polyethylene glycol (MIRALAX  / GLYCOLAX ) 17 g packet Take 17 g by mouth daily as needed for mild constipation.    [provider]  posaconazole (NOXAFIL) 100 MG TBEC delayed-release tablet Take by mouth.    [provider]  prochlorperazine  (COMPAZINE ) 10 MG tablet Take 1 tablet (10 mg total) by mouth every 6 (six) hours as needed for nausea or vomiting. 05/08/23   Avonne Boettcher, MD  valACYclovir  (VALTREX ) 500 MG tablet Take 1 tablet (500 mg total) by mouth 2 (two) times daily. 05/12/23   Rao, Archana C, MD  venetoclax (VENCLEXTA) 100 MG tablet Take 4 tablets (400 mg total) by mouth once weekly on day 1, 8, 15, and 22 of each cycle. Take  with a meal and water. Do not chew, crush, or break tablets. 04/01/23   [provider]  voriconazole (VFEND) 200 MG tablet Take 200 mg by mouth every 12 (twelve) hours. 07/04/23   [provider]    Physical Exam: Vitals:   09/24/23 1600 09/24/23 1645 09/24/23 1804 09/24/23 1901  BP: (!) 117/56 (!) 105/58  (!) 120/58  Pulse: 82 79  94  Resp: 15 16  20   Temp:   97.9 F (36.6 C) 97.8 F (36.6 C)  TempSrc:   Oral Oral  SpO2: 93% 95%  98%   Constitutional: In bed with head elevated, NAD, calm, comfortable Eyes: EOMI, lids and conjunctivae normal ENMT: Mucous membranes are moist. Posterior pharynx clear of any exudate or lesions.Normal dentition.  Neck: normal, supple, no masses. Respiratory: Inspiratory crackles left lower lung field, diminished breath sounds right lower lung field. Normal respiratory effort. No accessory muscle use.  Cardiovascular: Regular rate and rhythm, no murmurs / rubs / gallops. No extremity edema. 2+ pedal pulses.  Port-A-Cath in place right chest wall. Abdomen: no tenderness, no masses palpated. Musculoskeletal: no clubbing / cyanosis. No joint deformity upper and lower extremities. Good ROM, no  contractures. Normal muscle tone.  Skin: no rashes, lesions, ulcers. No induration Neurologic: Sensation intact. Strength 5/5 in all 4.  Psychiatric: Normal judgment and insight. Alert and oriented x 3. Normal mood.   EKG: Personally reviewed. Sinus rhythm, rate 99, no acute ischemic changes.  Assessment/Plan Principal Problem:   Pleural effusion on right Active Problems:   Acute myeloid leukemia not having achieved remission (HCC)   Anemia   Thrombocytopenia (HCC)   Chronic hyponatremia   [name] is a 68 y.o. female with medical history significant for acute myeloid leukemia, chronic anemia and thrombocytopenia, chronic hyponatremia who is admitted with right pleural effusion and acute on chronic thrombocytopenia.  Assessment and Plan: Right pleural effusion: New moderate-sized right pleural effusion seen on CT imaging likely cause of patient's symptoms with associated atelectasis. - Thoracentesis once platelets improved - Supplemental oxygen as needed, incentive spirometer, FV, Mucinex   Acute myeloid leukemia Acute on chronic thrombocytopenia Chronic anemia:  Follows with [facility] oncology, currently managed on hydroxyurea.  Recurrent thrombocytopenia despite platelet transfusion day prior to admission.  Patient denies obvious bleeding. - Transfuse 1 unit platelets - Hydroxyurea on hold - Repeat CBC in a.m.  Chronic hyponatremia: Sodium stable, monitor.   DVT prophylaxis: SCDs Start: 09/24/23 1957 Code Status: Full code, confirmed with patient on admission Family Communication: Cousin at bedside Disposition Plan: From home, dispo pending clinical progress Consults called: None Severity of Illness: The appropriate patient status for this patient is INPATIENT. Inpatient status is judged to be reasonable and necessary in order to provide the required intensity of service to ensure the patient's safety. The patient's presenting symptoms, physical exam findings, and initial  radiographic and laboratory data in the context of their chronic comorbidities is felt to place them at high risk for further clinical deterioration. Furthermore, it is not anticipated that the patient will be medically stable for discharge from the hospital within 2 midnights of admission.   * I certify that at the point of admission it is my clinical judgment that the patient will require inpatient hospital care spanning beyond 2 midnights from the point of admission due to high intensity of service, high risk for further deterioration and high frequency of surveillance required.Edith Gores MD [facility]  If 7PM-7AM, please contact night-coverage [email or web address]  09/24/2023,  8:23 PM

## 2023-09-24 NOTE — ED Provider Notes (Addendum)
 [facility]ERGENCY DEPARTMENT AT [facility] Provider Note   CSN: [id number] Arrival date & time: 09/24/23  1024     Patient presents with: Chest Pain   [name] is a 68 y.o. female.   Patient is a 68 year old female with a history of GERD, AML, tobacco use who is currently on hydroxyurea for her AML and did receive platelets yesterday at [facility] where she receives her care who is presenting today with ongoing pain in her right chest.  She reports the pain started about 1 week ago and has been persistent and worsening.  She has also had a dry cough.  She denies any fever or hemoptysis.  No nausea or vomiting.  Stools and urine have been normal.  She does report that they increased her hydroxyurea on Monday and she is concerned that that is what is causing her symptoms.  She also has history of pneumonia.  She denies history of clots and has not had any leg pain or swelling.  She denies any shortness of breath.  She has taken Tylenol  for the pain which is not helping.  The history is provided by the patient.  Chest Pain      Prior to Admission medications   Medication Sig Start Date End Date Taking? Authorizing Provider  cyclobenzaprine (FLEXERIL) 5 MG tablet Take 5 mg by mouth 3 (three) times daily as needed. 09/02/23  Yes [provider]  doxycycline  (MONODOX ) 100 MG capsule Take 100 mg by mouth 2 (two) times daily. 09/12/23  Yes [provider]  levofloxacin  (LEVAQUIN ) 750 MG tablet Take 750 mg by mouth daily. 09/12/23  Yes [provider]  traMADol (ULTRAM) 50 MG tablet Take 50 mg by mouth every 6 (six) hours as needed. 09/23/23 09/22/24 Yes [provider]  albuterol  (PROVENTIL ) (2.5 MG/3ML) 0.083% nebulizer solution Inhale 3 mLs (2.5 mg total) into the lungs every 4 (four) hours as needed for wheezing or shortness of breath. 07/07/23   Swayze, Ava, DO  allopurinol  (ZYLOPRIM ) 300 MG tablet Take 1 tablet (300 mg total) by mouth daily. 05/08/23    Avonne Boettcher, MD  ALPRAZolam  (XANAX ) 0.5 MG tablet Take 0.5 mg by mouth at bedtime as needed for sleep.    [provider]  ascorbic acid  (VITAMIN C ) 500 MG tablet Take 500 mg by mouth daily.    [provider]  b complex vitamins capsule Take 1 capsule by mouth daily.    [provider]  cholecalciferol  (VITAMIN D3) 10 MCG (400 UNIT) TABS tablet Take 400 Units by mouth.    [provider]  guaiFENesin  (MUCINEX ) 600 MG 12 hr tablet Take 2 tablets (1,200 mg total) by mouth 2 (two) times daily. 07/07/23   Swayze, Ava, DO  hydroxyurea (HYDREA) 500 MG capsule Take 1,000 mg by mouth 2 (two) times daily. May take with food to minimize GI side effects.    [provider]  hydrOXYzine (ATARAX) 25 MG tablet Take 25 mg by mouth 3 (three) times daily as needed for anxiety. 06/09/23   [provider]  lidocaine -prilocaine  (EMLA ) cream Apply 1 Application topically as needed. 05/20/23   Avonne Boettcher, MD  nicotine  (NICODERM CQ  - DOSED IN MG/24 HOURS) 21 mg/24hr patch Place 1 patch (21 mg total) onto the skin daily. 07/08/23   Swayze, Ava, DO  OLANZapine (ZYPREXA) 5 MG tablet Take 5 mg by mouth at bedtime. 06/25/23 07/25/23  [provider]  ondansetron  (ZOFRAN ) 8 MG tablet TAKE ONE  TABLET (8 MG TOTAL) BY MOUTH EVERY EIGHT HOURS AS NEEDED FOR NAUSEA OR VOMITING. 08/11/23   Avonne Boettcher, MD  oxyCODONE -acetaminophen  (PERCOCET) 5-325 MG tablet Take 1 tablet by mouth every 4 (four) hours as needed for severe pain (pain score 7-10). 05/17/23   Ruth Cove, MD  pantoprazole  (PROTONIX ) 20 MG tablet TAKE ONE TABLET (20 MG TOTAL) BY MOUTH DAILY. 07/23/23   Rao, Archana C, MD  polyethylene glycol (MIRALAX  / GLYCOLAX ) 17 g packet Take 17 g by mouth daily as needed for mild constipation.    [provider]  posaconazole (NOXAFIL) 100 MG TBEC delayed-release tablet Take by mouth.    [provider]  prochlorperazine  (COMPAZINE ) 10 MG tablet Take 1  tablet (10 mg total) by mouth every 6 (six) hours as needed for nausea or vomiting. 05/08/23   Avonne Boettcher, MD  valACYclovir  (VALTREX ) 500 MG tablet Take 1 tablet (500 mg total) by mouth 2 (two) times daily. 05/12/23   Rao, Archana C, MD  venetoclax (VENCLEXTA) 100 MG tablet Take 4 tablets (400 mg total) by mouth once weekly on day 1, 8, 15, and 22 of each cycle. Take with a meal and water. Do not chew, crush, or break tablets. 04/01/23   [provider]  voriconazole (VFEND) 200 MG tablet Take 200 mg by mouth every 12 (twelve) hours. 07/04/23   [provider]    Allergies: Penicillin g, Prednisone, Codeine, and Heparin     Review of Systems  Cardiovascular:  Positive for chest pain.    Updated Vital Signs BP (!) 121/53   Pulse 86   Temp 98.7 F (37.1 C) (Oral)   Resp 14   SpO2 95%   Physical Exam Vitals and nursing note reviewed.  Constitutional:      General: She is not in acute distress.    Appearance: She is well-developed.  HENT:     Head: Normocephalic and atraumatic.   Eyes:     Extraocular Movements: Extraocular movements intact.     Pupils: Pupils are equal, round, and reactive to light.    Cardiovascular:     Rate and Rhythm: Normal rate and regular rhythm.     Heart sounds: Normal heart sounds. No murmur heard.    No friction rub.  Pulmonary:     Effort: Pulmonary effort is normal.     Breath sounds: Normal breath sounds. No wheezing or rales.     Comments: No rashes present on the chest wall or back Chest:     Chest wall: Tenderness present.   Abdominal:     General: Bowel sounds are normal. There is no distension.     Palpations: Abdomen is soft.     Tenderness: There is abdominal tenderness in the right upper quadrant. There is no right CVA tenderness, left CVA tenderness, guarding or rebound.   Musculoskeletal:        General: No tenderness. Normal range of motion.     Right lower leg: No edema.     Left lower leg: No edema.      Comments: No edema   Skin:    General: Skin is warm and dry.     Findings: No rash.   Neurological:     Mental Status: She is alert and oriented to person, place, and time. Mental status is at baseline.     Cranial Nerves: No cranial nerve deficit.   Psychiatric:        Behavior: Behavior normal.     (  all labs ordered are listed, but only abnormal results are displayed) Labs Reviewed  CBC - Abnormal; Notable for the following components:      Result Value   RBC 2.66 (*)    Hemoglobin 7.6 (*)    HCT 22.7 (*)    RDW 20.1 (*)    Platelets 12 (*)    All other components within normal limits  COMPREHENSIVE METABOLIC PANEL WITH GFR - Abnormal; Notable for the following components:   Sodium 129 (*)    Chloride 94 (*)    All other components within normal limits  HEMOGLOBIN AND HEMATOCRIT, BLOOD - Abnormal; Notable for the following components:   Hemoglobin 7.5 (*)    HCT 22.2 (*)    All other components within normal limits  LIPASE, BLOOD  TROPONIN T, HIGH SENSITIVITY  TROPONIN T, HIGH SENSITIVITY    EKG: EKG Interpretation Date/Time:  Wednesday September 24 2023 10:58:50 EDT Ventricular Rate:  99 PR Interval:  168 QRS Duration:  88 QT Interval:  336 QTC Calculation: 432 R Axis:   -7  Text Interpretation: Sinus rhythm No significant change since last tracing Confirmed by Almond Army ([id number]) on 09/24/2023 11:41:13 AM  Radiology: CT Angio Chest PE W and/or Wo Contrast Result Date: 09/24/2023 CLINICAL DATA:  Central chest pain and right flank pain for the past week. Cough for the past week. Leukemia. Platelet transfusion yesterday. EXAM: CT ANGIOGRAPHY CHEST WITH CONTRAST TECHNIQUE: Multidetector CT imaging of the chest was performed using the standard protocol during bolus administration of intravenous contrast. Multiplanar CT image reconstructions and MIPs were obtained to evaluate the vascular anatomy. RADIATION DOSE REDUCTION: This exam was performed according to the  departmental dose-optimization program which includes automated exposure control, adjustment of the mA and/or kV according to patient size and/or use of iterative reconstruction technique. CONTRAST:  80mL OMNIPAQUE  IOHEXOL  350 MG/ML SOLN COMPARISON:  Chest radiographs obtained earlier today. Chest CTA dated 07/04/2023. FINDINGS: Cardiovascular: Normally opacified pulmonary arteries with no pulmonary arterial filling defects seen. Minimal atheromatous calcifications, including the coronary arteries and aorta. Stable mildly enlarged heart. No pericardial effusion. Right jugular porta catheter tip in the upper right atrium. Mediastinum/Nodes: Normal-appearing thyroid gland. Multiple enlarged mediastinal and bilateral hilar lymph nodes are again demonstrated, with an interval decrease in size. The previously demonstrated 1.2 cm right paratracheal index node currently measures 1.2 cm in short axis diameter on image number 46/5. The previously demonstrated 1.5 cm subcarinal node measures 1.5 cm on image number 51/5. The previously demonstrated 1.7 cm right hilar node currently measures 1.3 cm on image number 67/5. The previously demonstrated 1.5 cm left hilar node currently measures 1.1 cm on image number 68/5. Unremarkable trachea and esophagus. Lungs/Pleura: Interval moderate-sized right pleural effusion and adjacent right lower lobe compressive atelectasis. Significantly improved peripheral patchy densities on the right and resolved patchy densities on the left with the exception of minimal residual patchy density in the posterior lingula. Resolved pleural fluid on the left. Upper Abdomen: Grossly stable splenic enlargement, not included in its entirety. Musculoskeletal: Thoracic and lower cervical spine degenerative changes. Review of the MIP images confirms the above findings. IMPRESSION: 1. No pulmonary emboli. 2. Interval moderate-sized right pleural effusion and adjacent right lower lobe compressive atelectasis.  3. Significantly improved peripheral patchy densities on the right and resolved patchy densities on the left with the exception of minimal residual patchy density in the posterior lingula. This is compatible with significantly improved bilateral pneumonia. 4. Resolved left pleural fluid. 5. Interval decrease in  size of multiple enlarged mediastinal and bilateral hilar lymph nodes. 6. Stable mild cardiomegaly. 7. Minimal calcific coronary artery and aortic atherosclerosis. 8. Grossly stable splenomegaly, not included in its entirety. Aortic Atherosclerosis (ICD10-I70.0). Electronically Signed   By: Catherin Closs M.D.   On: 09/24/2023 14:22   DG Chest 2 View Result Date: 09/24/2023 CLINICAL DATA:  Chest pain. EXAM: CHEST - 2 VIEW COMPARISON:  07/04/2023 FINDINGS: Interval moderate-sized right pleural effusion and right basilar linear atelectasis. Clear left lung. Right jugular porta catheter tip in the inferior aspect of the superior vena cava. Mild thoracolumbar spine degenerative changes. Cholecystectomy clips. IMPRESSION: Interval moderate-sized right pleural effusion and right basilar linear atelectasis. Electronically Signed   By: Catherin Closs M.D.   On: 09/24/2023 14:06     Procedures   Medications Ordered in the ED  morphine  (PF) 4 MG/ML injection 4 mg (4 mg Intravenous Given 09/24/23 1146)  ondansetron  (ZOFRAN ) injection 4 mg (4 mg Intravenous Given 09/24/23 1146)  iohexol  (OMNIPAQUE ) 350 MG/ML injection 100 mL (80 mLs Intravenous Contrast Given 09/24/23 1318)  HYDROmorphone (DILAUDID) injection 1 mg (1 mg Intravenous Given 09/24/23 1340)                                    Medical Decision Making Amount and/or Complexity of Data Reviewed External Data Reviewed: notes. Labs: ordered. Decision-making details documented in ED Course. Radiology: ordered and independent interpretation performed. Decision-making details documented in ED Course. ECG/medicine tests: ordered and independent  interpretation performed. Decision-making details documented in ED Course.  Risk Prescription drug management. Decision regarding hospitalization.   Pt with multiple medical problems and comorbidities and presenting today with a complaint that caries a high risk for morbidity and mortality.  Here today with the above complaints.  Concern for PE, pneumonia, rib fracture, musculoskeletal injury, liver pathology.  Lower for cholecystitis, acute bowel issue or pyelonephritis or ACS.  Patient's vital signs do show some mild tachycardia but otherwise blood pressures stable.  Patient given pain control.  I independently interpreted patient's labs and EKG.  EKG without acute changes, CBC with normal white count today 4.7, hemoglobin has dropped to 7.6 from 9 yesterday and platelet count is at 12 after platelets yesterday which had initially gone up to 20.  CMP with hyponatremia of 129 which is down from 134 yesterday, lipase and troponin are normal.  I have independently visualized and interpreted pt's images today.   Chest x-ray with concern for right sided pleural effusion.  CTA pending.   2:30 PM  CTA shows moderate-sized pleural effusion with some atelectasis.  Radiology reports no PE, moderate-sized right pleural effusion and adjacent right lower lobe compressive atelectasis.  Significantly improved peripheral patchy densities significant for improved bilateral pneumonia, resolved left pleural effusion and decreased mediastinal and bilateral hilar lymph nodes.  Mild cardiomegaly without pericardial effusion.  On repeat evaluation patient sats are between 87 to 89%.  She was placed on 2 L of oxygen.  Repeat H&H was still 7.5.  Talked with [facility] oncology and they state pain patient now should be on 1000 mg of hydroxyurea twice daily and at this time they feel that patient can stay in our system and get workup for new effusion.  Patient is not on any anticoagulation.  However with a platelet count of 12 she  will need platelet transfusion prior to her thoracentesis.  Patient would like to stay here in town if  at all possible.  Will consult hospitalist service for admission.     Final diagnoses:  Thrombocytopenia (HCC)  Anemia, unspecified type  Pleural effusion on right    ED Discharge Orders     None          Almond Army, MD 09/24/23 1430    Almond Army, MD 09/24/23 1512

## 2023-09-24 NOTE — ED Notes (Signed)
 [facility] with cl called for transport

## 2023-09-24 NOTE — ED Triage Notes (Signed)
 C/o central CP and R flank pain x 1 week. Reports dry hacking cough x 1 week. Leukemia patient. Got platelets transfusion done yesterday.

## 2023-09-24 NOTE — Progress Notes (Signed)
 [facility] Admitting paged regarding patient's arrival to unit.

## 2023-09-24 NOTE — Hospital Course (Signed)
 [name] is a 68 y.o. female with medical history significant for acute myeloid leukemia, chronic anemia and thrombocytopenia, chronic hyponatremia who is admitted with right pleural effusion and acute on chronic thrombocytopenia.

## 2023-09-24 NOTE — ED Notes (Signed)
 [facility] called at [phone]-should rec a call back within 

## 2023-09-25 DIAGNOSIS — J90 Pleural effusion, not elsewhere classified: Secondary | ICD-10-CM | POA: Diagnosis not present

## 2023-09-25 LAB — PREPARE RBC (CROSSMATCH)

## 2023-09-25 LAB — BPAM PLATELET PHERESIS
Blood Product Expiration Date: 202506192359
ISSUE DATE / TIME: 202506182352
Unit Type and Rh: 5100

## 2023-09-25 LAB — PREPARE PLATELET PHERESIS: Unit division: 0

## 2023-09-25 LAB — CBC
HCT: 19.6 % — ABNORMAL LOW (ref 36.0–46.0)
HCT: 22.2 % — ABNORMAL LOW (ref 36.0–46.0)
Hemoglobin: 6.2 g/dL — CL (ref 12.0–15.0)
Hemoglobin: 7.4 g/dL — ABNORMAL LOW (ref 12.0–15.0)
MCH: 28.6 pg (ref 26.0–34.0)
MCH: 29.1 pg (ref 26.0–34.0)
MCHC: 31.6 g/dL (ref 30.0–36.0)
MCHC: 33.3 g/dL (ref 30.0–36.0)
MCV: 90.3 fL (ref 80.0–100.0)
MCV: 87.4 fL (ref 80.0–100.0)
Platelets: 18 10*3/uL — CL (ref 150–400)
Platelets: 24 10*3/uL — CL (ref 150–400)
RBC: 2.17 MIL/uL — ABNORMAL LOW (ref 3.87–5.11)
RBC: 2.54 MIL/uL — ABNORMAL LOW (ref 3.87–5.11)
RDW: 19.7 % — ABNORMAL HIGH (ref 11.5–15.5)
RDW: 18.6 % — ABNORMAL HIGH (ref 11.5–15.5)
WBC: 3.9 10*3/uL — ABNORMAL LOW (ref 4.0–10.5)
WBC: 3.8 10*3/uL — ABNORMAL LOW (ref 4.0–10.5)
nRBC: 0.8 % — ABNORMAL HIGH (ref 0.0–0.2)
nRBC: 0.5 % — ABNORMAL HIGH (ref 0.0–0.2)

## 2023-09-25 LAB — BASIC METABOLIC PANEL WITH GFR
Anion gap: 10 (ref 5–15)
BUN: 17 mg/dL (ref 8–23)
CO2: 24 mmol/L (ref 22–32)
Calcium: 8.2 mg/dL — ABNORMAL LOW (ref 8.9–10.3)
Chloride: 96 mmol/L — ABNORMAL LOW (ref 98–111)
Creatinine, Ser: 0.72 mg/dL (ref 0.44–1.00)
GFR, Estimated: >60 mL/min (ref >60)
Glucose, Bld: 94 mg/dL (ref 70–99)
Potassium: 3.8 mmol/L (ref 3.5–5.1)
Sodium: 130 mmol/L — ABNORMAL LOW (ref 135–145)

## 2023-09-25 LAB — PROTIME-INR
INR: 1.1 (ref 0.8–1.2)
Prothrombin Time: 14 s (ref 11.4–15.2)

## 2023-09-25 MED ORDER — POSACONAZOLE 100 MG PO TBEC
300.0000 mg | DELAYED_RELEASE_TABLET | Freq: Every day | ORAL | Status: DC
  Filled 2023-09-25 (×2): qty 3

## 2023-09-25 MED ORDER — HYDROXYUREA 500 MG PO CAPS
1000.0000 mg | ORAL_CAPSULE | Freq: Two times a day (BID) | ORAL | Status: DC
  Administered 2023-09-25 – 2023-09-26 (×3): 1000 mg via ORAL
  Filled 2023-09-25 (×3): qty 2

## 2023-09-25 MED ORDER — LEVOFLOXACIN 500 MG PO TABS
500.0000 mg | ORAL_TABLET | Freq: Every day | ORAL | Status: DC
  Administered 2023-09-25: 500 mg via ORAL
  Filled 2023-09-25: qty 1

## 2023-09-25 MED ORDER — SODIUM CHLORIDE 0.9% IV SOLUTION: Freq: Once | INTRAVENOUS | Status: AC

## 2023-09-25 MED ORDER — OXYCODONE HCL 5 MG PO TABS
5.0000 mg | ORAL_TABLET | Freq: Four times a day (QID) | ORAL | Status: DC | PRN
  Administered 2023-09-25: 5 mg via ORAL
  Filled 2023-09-25: qty 1

## 2023-09-25 MED ORDER — ALLOPURINOL 300 MG PO TABS
300.0000 mg | ORAL_TABLET | Freq: Every day | ORAL | Status: DC
  Administered 2023-09-25: 300 mg via ORAL
  Filled 2023-09-25: qty 1

## 2023-09-25 MED ORDER — LORAZEPAM 1 MG PO TABS
1.0000 mg | ORAL_TABLET | Freq: Once | ORAL | Status: AC | PRN
  Administered 2023-09-26: 1 mg via ORAL
  Filled 2023-09-25: qty 1

## 2023-09-25 NOTE — Progress Notes (Signed)
    Patient Name: [name]           DOB: September 21, 1955  MRN: [id number]      Admission Date: 09/24/2023  Attending Provider: Kenny Peals, MD  Primary Diagnosis: Pleural effusion on right   Level of care: Progressive    CROSS COVER NOTE   Date of Service   09/25/2023   [name], 68 y.o. female, was admitted on 09/24/2023 for Pleural effusion on right.    HPI/Events of Note   Acute on chronic anemia Pancytopenia related to acute myeloid leukemia Hemoglobin 7.5 -->  6.2.  No acute changes reported.  Hemodynamically stable. No melena, hematochezia, or other bleeding reported tonight.    Interventions/ Plan   Blood transfusion, 1 unit PRBC Recheck H&H after transfusion is completed, transfuse if HGB <7        Bailey Lesser, DNP, ACNPC- [facility] [place]

## 2023-09-25 NOTE — Progress Notes (Signed)
 Pharmacy Brief Note:   Pt follows with Dr. Sterling Eisenmenger at [facility] hematology/oncology. Called office to confirm which medications the patient is currently prescribed:  -hydroxyurea 1000 mg PO BID -allopurinol  300 mg PO daily -levofloxacin  500 mg PO daily -posaconazole 300 mg PO daily -trial of tramadol 50 mg PO q6h PRN back pain   Shireen Dory, PharmD 09/25/23 11:25 AM

## 2023-09-25 NOTE — TOC Initial Note (Signed)
 Transition of Care [facility]) - Initial/Assessment Note    Patient Details  Name: [name] MRN: [id number] Date of Birth: 05-13-1955  [facility] of [facility] CM/SW Contact:    Ruben Corolla, RN Phone Number: 09/25/2023, 10:36 AM  Clinical Narrative: d/c plan home.                  Expected Discharge Plan: Home/Self Care Barriers to Discharge: Continued Medical Work up   Patient Goals and CMS Choice            Expected Discharge Plan and Services                                              Prior Living Arrangements/Services                       Activities of Daily Living   ADL Screening (condition at time of admission) Independently performs ADLs?: Yes (appropriate for developmental age) Is the patient deaf or have difficulty hearing?: No Does the patient have difficulty seeing, even when wearing glasses/contacts?: No Does the patient have difficulty concentrating, remembering, or making decisions?: No  Permission Sought/Granted                  Emotional Assessment              Admission diagnosis:  Pleural effusion [J90] Thrombocytopenia (HCC) [D69.6] Pleural effusion on right [J90] Anemia, unspecified type [D64.9] Patient Active Problem List   Diagnosis Date Noted   Pleural effusion on right 09/24/2023   Thrombocytopenia (HCC) 09/24/2023   Chronic hyponatremia 09/24/2023   Colitis 07/05/2023   Multifocal pneumonia 07/04/2023   Pancytopenia (HCC) 07/04/2023   Gout 07/04/2023   Acute myeloid leukemia not having achieved remission (HCC) 03/10/2023   Anemia 03/04/2023   Aortic atherosclerosis (HCC) 03/04/2023   PCP:  Avonne Boettcher, MD Pharmacy:   [facility] [phone] [place], [place] - [address] [place] [address] Phone: [phone] Fax: [phone]  [facility] - [place], [place] - [address] [place] [address] Phone: [phone] Fax:  [phone]     Social Drivers of Health (SDOH) Social History: SDOH Screenings   Food Insecurity: No Food Insecurity (09/24/2023)  Housing: Low Risk  (09/24/2023)  Transportation Needs: No Transportation Needs (09/24/2023)  Utilities: Not At Risk (09/24/2023)  Alcohol Screen: Low Risk  (03/22/2021)  Depression (PHQ2-9): Low Risk  (05/06/2023)  Financial Resource Strain: Low Risk  (06/05/2023)   Received from [facility]  Physical Activity: Insufficiently Active (03/22/2021)  Social Connections: Socially Isolated (09/24/2023)  Stress: No Stress Concern Present (03/22/2021)  Tobacco Use: High Risk (09/24/2023)  Health Literacy: Low Risk  (03/13/2023)   Received from [facility]   SDOH Interventions:     Readmission Risk Interventions     No data to display

## 2023-09-25 NOTE — Progress Notes (Signed)
 PROGRESS NOTE    [name]  HYQ:[id number] DOB: 01/17/1956 DOA: 09/24/2023 PCP: Avonne Boettcher, MD   Brief Narrative: [name] is a 68 y.o. female with medical history significant for acute myeloid leukemia, chronic anemia and thrombocytopenia, chronic hyponatremia who is admitted with right pleural effusion and acute on chronic thrombocytopenia.   Assessment and Plan:  Right pleural effusion Unclear etiology. No evidence of infection. Causing symptoms of chest pain. -Thoracentesis once platelets are >50,000 -Check INR  Acute myeloid leukemia Patient initially managed with chemotherapy, for which she has declined further treatment. She is currently on hydroxyurea treatment for leukocytosis management, which is held on admission. She follows with [facility] medical oncology. She has been recommended for palliative care services. -Continue home hydroxyurea, Levaquin , posaconazole, allopurinol   Acute on chronic thrombocytopenia Secondary to AML. Patient is supported via transfusion as an outpatient. Platelets of 12,000 on admission with initial improvement to 19,000 after transfusion of 1 unit.   Acute on chronic anemia Patient is generally transfused for a hemoglobin less than 8. Hemoglobin of 6.2 this morning. 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 Patient follows with dermatology.   DVT prophylaxis: SCDs Code Status:   Code Status: Full Code Family Communication: None at bedside Disposition Plan: Discharge home likely in 24 hours pending thoracentesis and improved hemoglobin/platelets   Consultants:  None  Procedures:  None  Antimicrobials: None    Subjective: Patient reports no dyspnea at rest but still has pleuritic chest pain in the right lower lung area.  Objective: BP (!) 114/58 (BP Location: Left Arm)   Pulse 82   Temp 98.1 F (36.7 C) (Oral)   Resp 18    Ht 5' 5 (1.651 m)   Wt 68.7 kg   SpO2 95%   BMI 25.19 kg/m   Examination:  General exam: Appears calm and comfortable Respiratory system: Significantly diminished RLL breath sounds and mild LLL rales. Respiratory effort normal. Cardiovascular system: S1 & S2 heard, RRR. No murmurs, rubs, gallops or clicks. Gastrointestinal system: Abdomen is mildly distended, soft and nontender. No organomegaly or masses felt. Normal bowel sounds heard. Central nervous system: Alert and oriented. No focal neurological deficits. Psychiatry: Judgement and insight appear normal. Mood & affect appropriate.    Data Reviewed: I have personally reviewed following labs and imaging studies  CBC Lab Results  Component Value Date   WBC 3.9 (L) 09/25/2023   RBC 2.17 (L) 09/25/2023   HGB 6.2 (LL) 09/25/2023   HCT 19.6 (L) 09/25/2023   MCV 90.3 09/25/2023   MCH 28.6 09/25/2023   PLT 18 (LL) 09/25/2023   MCHC 31.6 09/25/2023   RDW 19.7 (H) 09/25/2023   LYMPHSABS 3.1 07/14/2023   MONOABS 0.6 07/14/2023   EOSABS 0.1 07/14/2023   BASOSABS 0.0 07/14/2023     Last metabolic panel Lab Results  Component Value Date   NA 130 (L) 09/25/2023   K 3.8 09/25/2023   CL 96 (L) 09/25/2023   CO2 24 09/25/2023   BUN 17 09/25/2023   CREATININE 0.72 09/25/2023   GLUCOSE 94 09/25/2023   GFRNONAA >60 09/25/2023   GFRAA >60 01/15/2018   CALCIUM 8.2 (L) 09/25/2023   PROT 6.8 09/24/2023   ALBUMIN 3.9 09/24/2023   BILITOT 0.7 09/24/2023   ALKPHOS 114 09/24/2023   AST 21 09/24/2023   ALT 23 09/24/2023   ANIONGAP 10 09/25/2023  GFR: Estimated Creatinine Clearance: 64.6 mL/min (by C-G formula based on SCr of 0.72 mg/dL).  No results found for this or any previous visit (from the past 240 hours).    Radiology Studies: CT Angio Chest PE W and/or Wo Contrast Result Date: 09/24/2023 CLINICAL DATA:  Central chest pain and right flank pain for the past week. Cough for the past week. Leukemia. Platelet transfusion  yesterday. EXAM: CT ANGIOGRAPHY CHEST WITH CONTRAST TECHNIQUE: Multidetector CT imaging of the chest was performed using the standard protocol during bolus administration of intravenous contrast. Multiplanar CT image reconstructions and MIPs were obtained to evaluate the vascular anatomy. RADIATION DOSE REDUCTION: This exam was performed according to the departmental dose-optimization program which includes automated exposure control, adjustment of the mA and/or kV according to patient size and/or use of iterative reconstruction technique. CONTRAST:  80mL OMNIPAQUE  IOHEXOL  350 MG/ML SOLN COMPARISON:  Chest radiographs obtained earlier today. Chest CTA dated 07/04/2023. FINDINGS: Cardiovascular: Normally opacified pulmonary arteries with no pulmonary arterial filling defects seen. Minimal atheromatous calcifications, including the coronary arteries and aorta. Stable mildly enlarged heart. No pericardial effusion. Right jugular porta catheter tip in the upper right atrium. Mediastinum/Nodes: Normal-appearing thyroid gland. Multiple enlarged mediastinal and bilateral hilar lymph nodes are again demonstrated, with an interval decrease in size. The previously demonstrated 1.2 cm right paratracheal index node currently measures 1.2 cm in short axis diameter on image number 46/5. The previously demonstrated 1.5 cm subcarinal node measures 1.5 cm on image number 51/5. The previously demonstrated 1.7 cm right hilar node currently measures 1.3 cm on image number 67/5. The previously demonstrated 1.5 cm left hilar node currently measures 1.1 cm on image number 68/5. Unremarkable trachea and esophagus. Lungs/Pleura: Interval moderate-sized right pleural effusion and adjacent right lower lobe compressive atelectasis. Significantly improved peripheral patchy densities on the right and resolved patchy densities on the left with the exception of minimal residual patchy density in the posterior lingula. Resolved pleural fluid on the  left. Upper Abdomen: Grossly stable splenic enlargement, not included in its entirety. Musculoskeletal: Thoracic and lower cervical spine degenerative changes. Review of the MIP images confirms the above findings. IMPRESSION: 1. No pulmonary emboli. 2. Interval moderate-sized right pleural effusion and adjacent right lower lobe compressive atelectasis. 3. Significantly improved peripheral patchy densities on the right and resolved patchy densities on the left with the exception of minimal residual patchy density in the posterior lingula. This is compatible with significantly improved bilateral pneumonia. 4. Resolved left pleural fluid. 5. Interval decrease in size of multiple enlarged mediastinal and bilateral hilar lymph nodes. 6. Stable mild cardiomegaly. 7. Minimal calcific coronary artery and aortic atherosclerosis. 8. Grossly stable splenomegaly, not included in its entirety. Aortic Atherosclerosis (ICD10-I70.0). Electronically Signed   By: Catherin Closs M.D.   On: 09/24/2023 14:22   DG Chest 2 View Result Date: 09/24/2023 CLINICAL DATA:  Chest pain. EXAM: CHEST - 2 VIEW COMPARISON:  07/04/2023 FINDINGS: Interval moderate-sized right pleural effusion and right basilar linear atelectasis. Clear left lung. Right jugular porta catheter tip in the inferior aspect of the superior vena cava. Mild thoracolumbar spine degenerative changes. Cholecystectomy clips. IMPRESSION: Interval moderate-sized right pleural effusion and right basilar linear atelectasis. Electronically Signed   By: Catherin Closs M.D.   On: 09/24/2023 14:06      LOS: 1 day    Aneita Keens, MD [facility] 09/25/2023, 7:54 AM   If 7PM-7AM, please contact night-coverage [email or web address]

## 2023-09-26 ENCOUNTER — Inpatient Hospital Stay (HOSPITAL_COMMUNITY)

## 2023-09-26 ENCOUNTER — Other Ambulatory Visit (HOSPITAL_COMMUNITY): Payer: Self-pay

## 2023-09-26 ENCOUNTER — Other Ambulatory Visit: Payer: Self-pay

## 2023-09-26 ENCOUNTER — Encounter: Payer: Self-pay | Admitting: Oncology

## 2023-09-26 DIAGNOSIS — J90 Pleural effusion, not elsewhere classified: Secondary | ICD-10-CM | POA: Diagnosis not present

## 2023-09-26 LAB — CBC
HCT: 25.5 % — ABNORMAL LOW (ref 36.0–46.0)
Hemoglobin: 8.5 g/dL — ABNORMAL LOW (ref 12.0–15.0)
MCH: 29.2 pg (ref 26.0–34.0)
MCHC: 33.3 g/dL (ref 30.0–36.0)
MCV: 87.6 fL (ref 80.0–100.0)
Platelets: 40 10*3/uL — ABNORMAL LOW (ref 150–400)
RBC: 2.91 MIL/uL — ABNORMAL LOW (ref 3.87–5.11)
RDW: 17.2 % — ABNORMAL HIGH (ref 11.5–15.5)
WBC: 3.2 10*3/uL — ABNORMAL LOW (ref 4.0–10.5)
nRBC: 0 % (ref 0.0–0.2)

## 2023-09-26 LAB — BPAM PLATELET PHERESIS
Blood Product Expiration Date: 202506192359
Blood Product Expiration Date: 202506192359
Blood Product Expiration Date: 202506212359
Blood Product Expiration Date: 202506232359
Blood Product Expiration Date: 202506222359
ISSUE DATE / TIME: 202506191100
ISSUE DATE / TIME: 202506191100
ISSUE DATE / TIME: 202506192020
ISSUE DATE / TIME: 202506192307
ISSUE DATE / TIME: 202506201004
ISSUE DATE / TIME: 202506201219
Unit Type and Rh: 6200
Unit Type and Rh: 7300
Unit Type and Rh: 5100
Unit Type and Rh: 6200
Unit Type and Rh: 6200

## 2023-09-26 LAB — PROTEIN, PLEURAL OR PERITONEAL FLUID: Total protein, fluid: 4.3 g/dL

## 2023-09-26 LAB — BODY FLUID CELL COUNT WITH DIFFERENTIAL
Eos, Fluid: 2 %
Lymphs, Fluid: 6 %
Monocyte-Macrophage-Serous Fluid: 90 % (ref 50–90)
Neutrophil Count, Fluid: 2 % (ref 0–25)
Total Nucleated Cell Count, Fluid: 8587 uL — ABNORMAL HIGH (ref 0–1000)

## 2023-09-26 LAB — PREPARE PLATELET PHERESIS
Unit division: 0
Unit division: 0
Unit division: 0
Unit division: 0

## 2023-09-26 LAB — BPAM RBC
ISSUE DATE / TIME: 202506190738
Unit Type and Rh: 202507082359
Unit Type and Rh: 6200

## 2023-09-26 LAB — TYPE AND SCREEN

## 2023-09-26 LAB — LACTATE DEHYDROGENASE, PLEURAL OR PERITONEAL FLUID: LD, Fluid: 454 U/L — ABNORMAL HIGH (ref 3–23)

## 2023-09-26 MED ORDER — METHOCARBAMOL 1000 MG/10ML IJ SOLN: 500.0000 mg | Freq: Once | INTRAMUSCULAR | Status: DC

## 2023-09-26 MED ORDER — LIDOCAINE 5 % EX PTCH
1.0000 | MEDICATED_PATCH | CUTANEOUS | Status: DC
  Administered 2023-09-26: 1 via TRANSDERMAL
  Filled 2023-09-26: qty 1

## 2023-09-26 MED ORDER — SODIUM CHLORIDE 0.9% IV SOLUTION: Freq: Once | INTRAVENOUS | Status: AC

## 2023-09-26 MED ORDER — LIDOCAINE 5 % EX PTCH
1.0000 | MEDICATED_PATCH | CUTANEOUS | 0 refills | Status: DC
  Filled 2023-09-26: qty 30, 30d supply, fill #0

## 2023-09-26 MED ORDER — METHOCARBAMOL 500 MG PO TABS
500.0000 mg | ORAL_TABLET | Freq: Four times a day (QID) | ORAL | Status: DC | PRN
  Administered 2023-09-26: 500 mg via ORAL
  Filled 2023-09-26: qty 1

## 2023-09-26 MED ORDER — LIDOCAINE HCL 1 % IJ SOLN
INTRAMUSCULAR | Status: AC
  Filled 2023-09-26: qty 20

## 2023-09-26 MED ORDER — METHOCARBAMOL 500 MG PO TABS
500.0000 mg | ORAL_TABLET | Freq: Three times a day (TID) | ORAL | 0 refills | Status: DC | PRN
  Filled 2023-09-26: qty 30, 10d supply, fill #0

## 2023-09-26 NOTE — Plan of Care (Signed)

## 2023-09-26 NOTE — Procedures (Signed)
 Ultrasound-guided diagnostic and therapeutic right thoracentesis performed yielding 770 cc of dark,bloody fluid. No immediate complications. Follow-up chest x-ray pending. The fluid was submitted to the lab for preordered studies.EBL none.

## 2023-09-26 NOTE — Discharge Summary (Signed)
 Physician Discharge Summary   Patient: [name] MRN: [id number] DOB: [birth year]  Admit date:     09/24/2023  Discharge date: 09/26/23  Discharge Physician: Aneita Keens, MD   PCP: Avonne Boettcher, MD   Recommendations at discharge:  PCP visit for hospital follow-up Follow-up pleural fluid labs: culture, cytology  Discharge Diagnoses: Principal Problem:   Pleural effusion on right Active Problems:   Acute myeloid leukemia not having achieved remission (HCC)   Anemia   Thrombocytopenia (HCC)   Chronic hyponatremia  Resolved Problems:   * No resolved hospital problems. *  Hospital Course: [name] is a 68 y.o. female with medical history significant for acute myeloid leukemia, chronic anemia and thrombocytopenia, chronic hyponatremia who is admitted with right pleural effusion and acute on chronic thrombocytopenia. Platelets and PRBC transfused. Thoracentesis performed on 6/20 revealing bloody fluid. Pleural fluid labs pending on discharge.  Assessment and Plan:  Right pleural effusion Unclear etiology. No evidence of infection. Causing symptoms of chest pain. Patient underwent successful thoracentesis on 6/20 yielding 770 mL of dark bloody fluid. Possibly related to recent pneumonia, complicated by thrombocytopenia history. Pleural fluid culture and cytology pending on discharge.   Acute myeloid leukemia Patient initially managed with chemotherapy, for which she has declined further treatment. She is currently on hydroxyurea treatment for leukocytosis management, which is held on admission. She follows with [facility] medical oncology. She has been recommended for palliative care services. Continue home hydroxyurea, Levaquin , posaconazole, allopurinol , although patient states this has been discontinued by her oncologist. Recommend outpatient follow-up regarding treatment plan.   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 Patient is supported via transfusion as an  outpatient. Platelets of 12,000 on admission with initial improvement to 19,000 after transfusion of 1 unit.  Patient received a total of 6 units of platelets to allow safer thoracentesis attempt.   Acute on chronic anemia Patient is generally transfused for a hemoglobin less than 8. Hemoglobin of 6.2 and transfused a total of 2 units this admission. Hemoglobin of 8.5 on day of discharge.   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 Patient follows with dermatology.   Consultants: None Procedures performed: Right thoracentesis  Disposition: Home Diet recommendation: Regular diet   DISCHARGE MEDICATION: Allergies as of 09/26/2023       Reactions   Penicillin G Anaphylaxis   Prednisone Anaphylaxis   Pt states it turned her into a monster, lips started swelling and couldn't breath   Codeine    Heparin          Medication List     STOP taking these medications    cyclobenzaprine 5 MG tablet Commonly known as: FLEXERIL   OLANZapine 5 MG tablet Commonly known as: ZYPREXA   oxyCODONE -acetaminophen  5-325 MG tablet Commonly known as: Percocet   prochlorperazine  10 MG tablet Commonly known as: COMPAZINE    valACYclovir  500 MG tablet Commonly known as: VALTREX    venetoclax 100 MG tablet Commonly known as: VENCLEXTA   voriconazole 200 MG tablet Commonly known as: VFEND       TAKE these medications    albuterol  (2.5 MG/3ML) 0.083% nebulizer solution Commonly known as: PROVENTIL  Inhale 3 mLs (2.5 mg total) into the lungs every 4 (four) hours as needed for wheezing or shortness of breath.   allopurinol  300 MG tablet Commonly known as: Zyloprim  Take 1 tablet (300 mg total) by mouth daily.   ALPRAZolam  0.5 MG tablet Commonly known as: XANAX  Take 0.5 mg by mouth at bedtime  as needed for sleep.   ascorbic acid  500 MG tablet Commonly known as: VITAMIN C  Take 500 mg by mouth daily.   b complex vitamins capsule Take 1 capsule by mouth daily.    cholecalciferol  10 MCG (400 UNIT) Tabs tablet Commonly known as: VITAMIN D3 Take 400 Units by mouth.   doxycycline  100 MG capsule Commonly known as: MONODOX  Take 100 mg by mouth 2 (two) times daily.   hydroxyurea 500 MG capsule Commonly known as: HYDREA Take 1,000 mg by mouth 2 (two) times daily. May take with food to minimize GI side effects.   hydrOXYzine 25 MG tablet Commonly known as: ATARAX Take 25 mg by mouth 3 (three) times daily as needed for anxiety.   levofloxacin  500 MG tablet Commonly known as: LEVAQUIN  Take 500 mg by mouth daily.   lidocaine  5 % Commonly known as: LIDODERM  Place 1 patch onto the skin daily. Remove & Discard patch within 12 hours or as directed by MD Start taking on: September 27, 2023   lidocaine -prilocaine  cream Commonly known as: EMLA  Apply 1 Application topically as needed.   methocarbamol 500 MG tablet Commonly known as: ROBAXIN Take 1 tablet (500 mg total) by mouth every 8 (eight) hours as needed for muscle spasms.   nicotine  21 mg/24hr patch Commonly known as: NICODERM CQ  - dosed in mg/24 hours Place 1 patch (21 mg total) onto the skin daily.   ondansetron  8 MG tablet Commonly known as: ZOFRAN  TAKE ONE TABLET (8 MG TOTAL) BY MOUTH EVERY EIGHT HOURS AS NEEDED FOR NAUSEA OR VOMITING.   pantoprazole  20 MG tablet Commonly known as: PROTONIX  TAKE ONE TABLET (20 MG TOTAL) BY MOUTH DAILY.   polyethylene glycol 17 g packet Commonly known as: MIRALAX  / GLYCOLAX  Take 17 g by mouth daily as needed for mild constipation.   posaconazole 100 MG Tbec delayed-release tablet Commonly known as: NOXAFIL Take 300 mg by mouth daily.   traMADol 50 MG tablet Commonly known as: ULTRAM Take 50 mg by mouth every 6 (six) hours as needed for moderate pain (pain score 4-6).        Discharge Exam: BP 123/74   Pulse 83   Temp 98.6 F (37 C) (Oral)   Resp 18   Ht 5' 5 (1.651 m)   Wt 68.7 kg   SpO2 97%   BMI 25.19 kg/m   General exam: Appears  calm and comfortable Respiratory system: Significantly diminished breath sounds on right. Respiratory effort normal. Cardiovascular system: S1 & S2 heard, RRR. No murmurs, rubs, gallops or clicks. Gastrointestinal system: Abdomen is nondistended, soft and nontender. No organomegaly or masses felt. Normal bowel sounds heard. Central nervous system: Alert and oriented. No focal neurological deficits. Psychiatry: Judgement and insight appear normal. Mood & affect appropriate.   Condition at discharge: stable  The results of significant diagnostics from this hospitalization (including imaging, microbiology, ancillary and laboratory) are listed below for reference.   Imaging Studies: DG CHEST PORT 1 VIEW Result Date: 09/26/2023 CLINICAL DATA:  [id number] Status post right thoracentesis [id number] EXAM: PORTABLE CHEST 1 VIEW COMPARISON:  09/24/2023 chest radiograph. FINDINGS: Stable right internal jugular Port-A-Cath terminating over the cavoatrial junction. Stable cardiomediastinal silhouette with top normal heart size. No pneumothorax. Small right pleural effusion, slightly decreased. No left pleural effusion. No pulmonary edema. Persistent streaky dense right lung base opacity. Similar patchy hazy peripheral lower left lung opacity. IMPRESSION: 1. Small right pleural effusion, slightly decreased. No pneumothorax. 2. Persistent streaky dense right lung base opacity, favor atelectasis.  3. Similar patchy hazy peripheral lower left lung opacity, either atelectasis, aspiration or pneumonia. Continued chest radiograph follow-up advised. Electronically Signed   By: Levell Reach M.D.   On: 09/26/2023 12:20   US  THORACENTESIS ASP PLEURAL SPACE W/IMG GUIDE Result Date: 09/26/2023 INDICATION: Patient with history of acute myeloid leukemia, anemia, thrombocytopenia, skin cancer, right pleural effusion; request received for diagnostic and therapeutic right thoracentesis. EXAM: ULTRASOUND GUIDED DIAGNOSTIC AND THERAPEUTIC  RIGHT THORACENTESIS MEDICATIONS: 8 mL 1% lidocaine  COMPLICATIONS: None immediate. PROCEDURE: An ultrasound guided thoracentesis was thoroughly discussed with the patient and questions answered. The benefits, risks, alternatives and complications were also discussed. The patient understands and wishes to proceed with the procedure. Written consent was obtained. Ultrasound was performed to localize and mark an adequate pocket of fluid in the right chest. The area was then prepped and draped in the normal sterile fashion. 1% Lidocaine  was used for local anesthesia. Under ultrasound guidance a 6 Fr Safe-T-Centesis catheter was introduced. Thoracentesis was performed. The catheter was removed and a dressing applied. FINDINGS: A total of approximately 770 cc of dark, bloody fluid was removed. Samples were sent to the laboratory as requested by the clinical team. IMPRESSION: Successful ultrasound guided diagnostic and therapeutic right thoracentesis yielding 770 cc of pleural fluid. Performed by: Wash Hack Electronically Signed   By: Melven Stable.  Shick M.D.   On: 09/26/2023 10:32   CT Angio Chest PE W and/or Wo Contrast Result Date: 09/24/2023 CLINICAL DATA:  Central chest pain and right flank pain for the past week. Cough for the past week. Leukemia. Platelet transfusion yesterday. EXAM: CT ANGIOGRAPHY CHEST WITH CONTRAST TECHNIQUE: Multidetector CT imaging of the chest was performed using the standard protocol during bolus administration of intravenous contrast. Multiplanar CT image reconstructions and MIPs were obtained to evaluate the vascular anatomy. RADIATION DOSE REDUCTION: This exam was performed according to the departmental dose-optimization program which includes automated exposure control, adjustment of the mA and/or kV according to patient size and/or use of iterative reconstruction technique. CONTRAST:  80mL OMNIPAQUE  IOHEXOL  350 MG/ML SOLN COMPARISON:  Chest radiographs obtained earlier today. Chest CTA  dated 07/04/2023. FINDINGS: Cardiovascular: Normally opacified pulmonary arteries with no pulmonary arterial filling defects seen. Minimal atheromatous calcifications, including the coronary arteries and aorta. Stable mildly enlarged heart. No pericardial effusion. Right jugular porta catheter tip in the upper right atrium. Mediastinum/Nodes: Normal-appearing thyroid gland. Multiple enlarged mediastinal and bilateral hilar lymph nodes are again demonstrated, with an interval decrease in size. The previously demonstrated 1.2 cm right paratracheal index node currently measures 1.2 cm in short axis diameter on image number 46/5. The previously demonstrated 1.5 cm subcarinal node measures 1.5 cm on image number 51/5. The previously demonstrated 1.7 cm right hilar node currently measures 1.3 cm on image number 67/5. The previously demonstrated 1.5 cm left hilar node currently measures 1.1 cm on image number 68/5. Unremarkable trachea and esophagus. Lungs/Pleura: Interval moderate-sized right pleural effusion and adjacent right lower lobe compressive atelectasis. Significantly improved peripheral patchy densities on the right and resolved patchy densities on the left with the exception of minimal residual patchy density in the posterior lingula. Resolved pleural fluid on the left. Upper Abdomen: Grossly stable splenic enlargement, not included in its entirety. Musculoskeletal: Thoracic and lower cervical spine degenerative changes. Review of the MIP images confirms the above findings. IMPRESSION: 1. No pulmonary emboli. 2. Interval moderate-sized right pleural effusion and adjacent right lower lobe compressive atelectasis. 3. Significantly improved peripheral patchy densities on the right and resolved patchy densities  on the left with the exception of minimal residual patchy density in the posterior lingula. This is compatible with significantly improved bilateral pneumonia. 4. Resolved left pleural fluid. 5. Interval  decrease in size of multiple enlarged mediastinal and bilateral hilar lymph nodes. 6. Stable mild cardiomegaly. 7. Minimal calcific coronary artery and aortic atherosclerosis. 8. Grossly stable splenomegaly, not included in its entirety. Aortic Atherosclerosis (ICD10-I70.0). Electronically Signed   By: Catherin Closs M.D.   On: 09/24/2023 14:22   DG Chest 2 View Result Date: 09/24/2023 CLINICAL DATA:  Chest pain. EXAM: CHEST - 2 VIEW COMPARISON:  07/04/2023 FINDINGS: Interval moderate-sized right pleural effusion and right basilar linear atelectasis. Clear left lung. Right jugular porta catheter tip in the inferior aspect of the superior vena cava. Mild thoracolumbar spine degenerative changes. Cholecystectomy clips. IMPRESSION: Interval moderate-sized right pleural effusion and right basilar linear atelectasis. Electronically Signed   By: Catherin Closs M.D.   On: 09/24/2023 14:06    Microbiology: Results for orders placed or performed during the hospital encounter of 07/04/23  Resp panel by RT-PCR (RSV, Flu A&B, Covid) Anterior Nasal Swab     Status: None   Collection Time: 07/04/23  3:41 PM   Specimen: Anterior Nasal Swab  Result Value Ref Range Status   SARS Coronavirus 2 by RT PCR NEGATIVE NEGATIVE Final    Comment: (NOTE) SARS-CoV-2 target nucleic acids are NOT DETECTED.  The SARS-CoV-2 RNA is generally detectable in upper respiratory specimens during the acute phase of infection. The lowest concentration of SARS-CoV-2 viral copies this assay can detect is 138 copies/mL. A negative result does not preclude SARS-Cov-2 infection and should not be used as the sole basis for treatment or other patient management decisions. A negative result may occur with  improper specimen collection/handling, submission of specimen other than nasopharyngeal swab, presence of viral mutation(s) within the areas targeted by this assay, and inadequate number of viral copies(<138 copies/mL). A negative result  must be combined with clinical observations, patient history, and epidemiological information. The expected result is Negative.  Fact Sheet for Patients:  BloggerCourse.com  Fact Sheet for Healthcare Providers:  SeriousBroker.it  This test is no t yet approved or cleared by the [place]  FDA and  has been authorized for detection and/or diagnosis of SARS-CoV-2 by FDA under an Emergency Use Authorization (EUA). This EUA will remain  in effect (meaning this test can be used) for the duration of the COVID-19 declaration under Section 564(b)(1) of the Act, 21 U.S.C.section 360bbb-3(b)(1), unless the authorization is terminated  or revoked sooner.       Influenza A by PCR NEGATIVE NEGATIVE Final   Influenza B by PCR NEGATIVE NEGATIVE Final    Comment: (NOTE) The Xpert Xpress SARS-CoV-2/FLU/RSV plus assay is intended as an aid in the diagnosis of influenza from Nasopharyngeal swab specimens and should not be used as a sole basis for treatment. Nasal washings and aspirates are unacceptable for Xpert Xpress SARS-CoV-2/FLU/RSV testing.  Fact Sheet for Patients: BloggerCourse.com  Fact Sheet for Healthcare Providers: SeriousBroker.it  This test is not yet approved or cleared by the [place]  FDA and has been authorized for detection and/or diagnosis of SARS-CoV-2 by FDA under an Emergency Use Authorization (EUA). This EUA will remain in effect (meaning this test can be used) for the duration of the COVID-19 declaration under Section 564(b)(1) of the Act, 21 U.S.C. section 360bbb-3(b)(1), unless the authorization is terminated or revoked.     Resp Syncytial Virus by PCR NEGATIVE NEGATIVE Final  Comment: (NOTE) Fact Sheet for Patients: BloggerCourse.com  Fact Sheet for Healthcare Providers: SeriousBroker.it  This test is  not yet approved or cleared by the [place]  FDA and has been authorized for detection and/or diagnosis of SARS-CoV-2 by FDA under an Emergency Use Authorization (EUA). This EUA will remain in effect (meaning this test can be used) for the duration of the COVID-19 declaration under Section 564(b)(1) of the Act, 21 U.S.C. section 360bbb-3(b)(1), unless the authorization is terminated or revoked.  Performed at [facility], [address] [place], [place] [address]   Culture, blood (routine x 2)     Status: None   Collection Time: 07/04/23  3:42 PM   Specimen: BLOOD  Result Value Ref Range Status   Specimen Description BLOOD BLOOD RIGHT ARM  Final   Special Requests   Final    BOTTLES DRAWN AEROBIC AND ANAEROBIC Blood Culture results may not be optimal due to an inadequate volume of blood received in culture bottles   Culture   Final    NO GROWTH 5 DAYS Performed at [facility], [address] [place], [place] [address]    Report Status 07/09/2023 FINAL  Final  Culture, blood (routine x 2)     Status: None   Collection Time: 07/04/23  3:50 PM   Specimen: BLOOD  Result Value Ref Range Status   Specimen Description BLOOD BLOOD LEFT ARM  Final   Special Requests   Final    BOTTLES DRAWN AEROBIC AND ANAEROBIC Blood Culture results may not be optimal due to an inadequate volume of blood received in culture bottles   Culture   Final    NO GROWTH 5 DAYS Performed at [facility], [address] [place], [place] [address]    Report Status 07/09/2023 FINAL  Final  Respiratory (~20 pathogens) panel by PCR     Status: None   Collection Time: 07/04/23  7:45 PM   Specimen: Nasopharyngeal Swab; Respiratory  Result Value Ref Range Status   Adenovirus NOT DETECTED NOT DETECTED Final   Coronavirus 229E NOT DETECTED NOT DETECTED Final    Comment: (NOTE) The Coronavirus on the Respiratory Panel, DOES NOT test for the novel  Coronavirus (2019 nCoV)     Coronavirus HKU1 NOT DETECTED NOT DETECTED Final   Coronavirus NL63 NOT DETECTED NOT DETECTED Final   Coronavirus OC43 NOT DETECTED NOT DETECTED Final   Metapneumovirus NOT DETECTED NOT DETECTED Final   Rhinovirus / Enterovirus NOT DETECTED NOT DETECTED Final   Influenza A NOT DETECTED NOT DETECTED Final   Influenza B NOT DETECTED NOT DETECTED Final   Parainfluenza Virus 1 NOT DETECTED NOT DETECTED Final   Parainfluenza Virus 2 NOT DETECTED NOT DETECTED Final   Parainfluenza Virus 3 NOT DETECTED NOT DETECTED Final   Parainfluenza Virus 4 NOT DETECTED NOT DETECTED Final   Respiratory Syncytial Virus NOT DETECTED NOT DETECTED Final   Bordetella pertussis NOT DETECTED NOT DETECTED Final   Bordetella Parapertussis NOT DETECTED NOT DETECTED Final   Chlamydophila pneumoniae NOT DETECTED NOT DETECTED Final   Mycoplasma pneumoniae NOT DETECTED NOT DETECTED Final    Comment: Performed at [facility], [address], [place], [place] [address]  MRSA Next Gen by PCR, Nasal     Status: None   Collection Time: 07/05/23  5:07 AM   Specimen: Nasal Mucosa; Nasal Swab  Result Value Ref Range Status   MRSA by PCR Next Gen NOT DETECTED NOT DETECTED Final    Comment: (  NOTE) The GeneXpert MRSA Assay (FDA approved for NASAL specimens only), is one component of a comprehensive MRSA colonization surveillance program. It is not intended to diagnose MRSA infection nor to guide or monitor treatment for MRSA infections. Test performance is not FDA approved in patients less than 15 years old. Performed at [facility], [address] [place], [place] [address]     Labs: CBC: Recent Labs  Lab 09/24/23 1141 09/24/23 1414 09/25/23 0401 09/25/23 1740 09/26/23 0550  WBC 4.7  --  3.9* 3.8* 3.2*  HGB 7.6* 7.5* 6.2* 7.4* 8.5*  HCT 22.7* 22.2* 19.6* 22.2* 25.5*  MCV 85.3  --  90.3 87.4 87.6  PLT 12*  --  18* 24* 40*   Basic Metabolic Panel: Recent Labs  Lab 09/24/23 1141 09/25/23 0401   NA 129* 130*  K 4.3 3.8  CL 94* 96*  CO2 23 24  GLUCOSE 99 94  BUN 16 17  CREATININE 0.65 0.72  CALCIUM 9.1 8.2*   Liver Function Tests: Recent Labs  Lab 09/24/23 1141  AST 21  ALT 23  ALKPHOS 114  BILITOT 0.7  PROT 6.8  ALBUMIN 3.9    Discharge time spent: 35 minutes.  Signed: Aneita Keens, MD [facility] 09/26/2023

## 2023-09-26 NOTE — Progress Notes (Signed)
 Discharge medication delivered to patient at bedside [facility]

## 2023-09-29 LAB — TYPE AND SCREEN
ABO/RH(D): A POS
Antibody Screen: NEGATIVE
Unit division: 0
Unit division: 0

## 2023-09-29 LAB — BPAM RBC
Blood Product Expiration Date: 202507022359
Blood Product Expiration Date: 202507082359
ISSUE DATE / TIME: 202506200141
Unit Type and Rh: 6200

## 2023-09-29 LAB — BODY FLUID CULTURE W GRAM STAIN
Culture: NO GROWTH
Gram Stain: NONE SEEN

## 2023-09-29 LAB — PREPARE PLATELET PHERESIS
Unit division: 0
Unit division: 0

## 2023-09-29 LAB — BPAM PLATELET PHERESIS
Blood Product Expiration Date: 202506232359
Unit Type and Rh: 5100

## 2023-09-30 ENCOUNTER — Telehealth (HOSPITAL_COMMUNITY): Payer: Self-pay | Admitting: Pharmacy Technician

## 2023-09-30 LAB — CYTOLOGY - NON PAP

## 2023-09-30 NOTE — Telephone Encounter (Signed)
 Pharmacy Patient Advocate Encounter  Received notification from [facility] that Prior Authorization for Lidocaine  5% patches  has been APPROVED from 09/30/2023 to 04/07/2024   PA #/Case ID/Reference #: [id number]

## 2023-09-30 NOTE — Telephone Encounter (Signed)
 Pharmacy Patient Advocate Encounter   Received notification from Fax that prior authorization for Lidocaine  5% patches is required/requested.   Insurance verification completed.   The patient is insured through [employer] .   Per test claim: PA required; PA submitted to above mentioned insurance via CoverMyMeds Key/confirmation #/EOC [facility] Status is pending

## 2023-12-15 NOTE — Progress Notes (Signed)
 [facility] Follow Up Visit Note   Patient Name: [name] Patient Age: 68 y.o. 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 At presentation, she had symptomatic anemia, abdominal pain with imaging findings suggestive of vasculitis, and leukocytosis (WBC 33). She initially deferred therapy but later began decitabine  monotherapy in February 2025, which was insufficient to control disease despite adjunctive hydroxyurea . She subsequently transitioned to decitabine  plus venetoclax, with Cycle 1 notable for residual disease but some count recovery. Cycle 2 was complicated by prolonged pneumonia requiring multiple antibiotic courses and intermittent venetoclax interruptions. Despite therapy, chest imaging demonstrated persistent infiltrates, and she declined further hospital admission for expedited evaluation of possible fungal or atypical pneumonia, as well as antifungal prophylaxis. Bone marrow after Cycle 2 showed rising blasts, and repeat mutation profiling revealed new WT1 and FLT3 mutations, consistent with therapy resistance and a proliferative course. Given her treatment-refractory disease and limited tolerance for intensive or targeted therapies, we discussed that further options would be limited to salvage chemotherapy or FLT3-directed therapy, both of which require close monitoring and significant supportive  care. Prioritizing quality of life and minimizing interventions, she has elected to continue with hydroxyurea  for cytoreduction in combination with best supportive care, understanding this approach is temporary and will be complicated by progressive marrow suppression and increasing transfusion needs.  [name] is currently receiving the maximum dose of hydroxyurea , which she is unable to fully tolerate due to persistent nausea and intermittent emesis. We discussed increasing ondansetron  up to three times daily and increasing lorazepam  to twice daily to manage both nausea and anxiety. She also has access to breakthrough antiemetics, including prochlorperazine  and promethazine . An ECG will be performed today to establish a new baseline QTc interval. We reviewed the continuation of levofloxacin  for functional neutropenia. The rationale for withholding some packed red blood cell transfusions was discussed, specifically the risks related to leukostasis and additional transfusions in the context of her symptoms of volume overload, pulmonary congestion, and pleural effusions. A chest X-ray will be obtained to assess for worsening pulmonary edema and recurrent effusions. Treatment with diuretics is planned, with reassessment for the need for repeat thoracentesis. She was advised to seek emergency department evaluation if her symptoms worsen.  Regarding her goals of care, she understands that supportive care options are limited by the rising white blood cell count and that hydroxyurea  dosing is constrained by its toxicity and current gastrointestinal side effects. She continues to decline more intensive treatments aimed at controlling leukemia, acknowledging that she may become ineligible for such therapies if her performance status declines further. At this time, she is not prepared to initiate hospice care.  Plans and Recommendations: Continue hydroxyurea  2g QID Continue levofloxacin   Continue compazine ,  phenergan , and zofran  for nausea, plan to check QTc  Continue lorazepam  for anxiety and refractory nausease Plan for CXR to re-assess for recurrent pleural effusions, will order a course of furosemide 40 mg daily (7-days) if evidence of pulmonary edema/effusions Continue bi-weekly lab checks and transfusion support Transfusion thresholds: 1u of  platelets if platelets < 20 1u of pRBCs if Hgb < 7 and WBC < 30, consider if WBC>30 if very symptomatic (tachycardia, chest pain, exertional dyspnea) from the anemia 1u of pRBCs if Hbg < 5 regardless of WBC Ongoing goals of care discussions  Jose C. Chandra, MD, PhD Clinical Assistant Professor Leukemia Program Division of Hematology [facility]  Interval History:  Since her last clinic visit, she has continued twice-weekly laboratory visits with transfusion support. Transfusion has been challenging due to persistent leukocytosis, with a white blood cell count remaining in the 40s. She has been taking hydroxyurea  2 g up to four times daily; however, dosing is limited by nausea and vomiting. She reports worsening fatigue and exertional dyspnea. She denies cough, rhinorrhea, chills, dyspnea at rest, or fever. She has not experienced any bleeding complications.  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 Initial lab work with WBC 33.7 with 27% blasts.  Presenting WBC Count: 33.7  Bone Marrow Biopsy, 03/12/2023: -  Hypercellular bone marrow (80%) involved by acute myeloid leukemia (64% blasts by manual aspirate differential)  Flow Cytometry: The first population of 26% cells is gated on the immature cell region characterized by moderate CD45 expression and low side scatter. Cells within this region express CD13, CD117, CD34, CD38, Tdt,  HLA-DR, partial CD33 and CD71, and partial/aberrant CD19 and CD7, and minimal CD22. These cells do not express surface or cytoplasmic CD3, or show significant expression of the other B, T, or myelo-monocytic markers analyzed.   Normal Karyotype: 46,XX[20] Normal FISH:  An interphase FISH assay shows no evidence of a rearrangement involving the KMT2A (MLL) region in the 200 nuclei scored. Normal FISH:  A NUP98 interphase FISH assay shows a normal fluorescence pattern in 99.5% of the 200 cells scored. This is within normal limits for this assay.   Molecular Mutations:      Variants of Known/Likely Clinical Significance:  Gene Coding Predicted Protein Variant allele fraction  FLT3 c.2028C>G p.(Asn676Lys) 8.5%  PHF6 c.70_71insCCTTCCC p.(Arg24Thrfs*14) 13.7%  RUNX1 c.310dup p.(Uym895Jdwqd*65) 40.1%  SF3B1 c.1998G>T p.(Lys666Asn) 41.9%  TET2 c.4546C>T p.(Arg1516*) 4.9%   Therapy: Decitabine  20 mg/m2 daily for 5-days, 28-days cycles: - Cycle 1 05/12/2023  Decitabine  20 mg/m2 daily for 5-days + venetoclax with ramp up, 28 days: - C1D1 06/05/2023  Bone Marrow Biopsy, 06/25/2023: -  Hypercellular bone marrow (90%) with persistent involvement by acute myeloid leukemia (20% blasts by manual aspirate differential) -  Flow cytometric MRD analysis findings reveal an aberrant myeloid progenitor population present at 11% of total non-erythroid cells, consistent with residual AML; see linked report for details.  - C2D1 07/14/2023, Decitabine  20 mg/m2 daily for 5-days + 400 mg venetoclax for 28 days:  Bone Marrow Biopsy, 08/14/2023: Bone marrow, right iliac, aspiration and biopsy -  Hypercellular bone marrow (70%) with acute myeloid leukemia (61% blasts by manual aspirate differential) Variants of Known/Likely Clinical Significance:  Gene Coding Predicted Protein Variant allele fraction  FLT3 - ITD mutation -  FLT3 c.2028C>G p.(Asn676Lys) 30%  RUNX1 c.310dup p.(Uym895Jdwqd*65) 35.4%  SF3B1 c.1998G>T  p.(Lys666Asn) 37.7%  WT1 c.1120C>T p.(Arg374*) 14.5%  WT1 c.1153delinsAA p.(Arg385Lysfs*5) 12.5%   Treatment stopped due to lack of efficacy    Review of Systems:  ROS reviewed and negative except as noted in the history.  Allergies: Allergies  Allergen Reactions  . Penicillin Anaphylaxis  .  Prednisone Hives and Swelling    Patient reports that she has had several doses of steroids and after doses of steroids she has had rashes, hives, with lip and throat swelling  . Amoxicillin   . Heparin  Analogues Other (See Comments)    Patient reports syncope following a dose of heparin  prior to her gallbladder surgery  . Codeine Nausea And Vomiting and Hives  . Heparin  Nausea And Vomiting   Medications:   Current Outpatient Medications:  .  ALPRAZolam  (XANAX ) 0.5 MG tablet, Take 1 tablet (0.5 mg total) by mouth two (2) times a day as needed for sleep or anxiety. Or for nausea not improved with other medications (Zofran , Compazine , Phenergan ), Disp: 60 tablet, Rfl: 0 .  alum-mag-simeth (MAALOX PLUS) 200-200-20 mg/5 mL Susp, Mix equal parts diphenhydramine , lidocaine , and liquid antacid. Swish 5 to 10mLs in mouth for 60 seconds, every 4 to 6 hours as needed, then swallow or spit mixture out (as directed by prescriber). Shake well before using. Refrigerate after mixing and discard after 14 days., Disp: 355 mL, Rfl: 2 .  diphenhydrAMINE  (BENADRYL ) 12.5 mg/5 mL liquid, Mix equal parts diphenhydramine , lidocaine , and liquid antacid. Swish 5 to 10mLs in mouth for 60 seconds, every 4 to 6 hours as needed, then swallow or spit mixture out (as directed by prescriber). Shake well before using. Refrigerate after mixing and discard after 14 days., Disp: 118 mL, Rfl: 0 .  fexofenadine (ALLEGRA) 180 MG tablet, Take 1 tablet (180 mg total) by mouth daily., Disp: , Rfl:  .  furosemide (LASIX) 40 MG tablet, Take 1 tablet (40 mg total) by mouth daily for 7 days., Disp: 7 tablet, Rfl: 0 .  hydroxyurea  (HYDREA ) 500 mg  capsule, Take 2 capsules (1,000 mg total) by mouth four (4) times a day., Disp: 120 capsule, Rfl: 0 .  levoFLOXacin  (LEVAQUIN ) 250 MG tablet, Take 1 tablet (250 mg total) by mouth daily., Disp: 30 tablet, Rfl: 2 .  lidocaine  (XYLOCAINE ) 2 % Soln, Mix equal parts diphenhydramine , lidocaine , and liquid antacid. Swish 5 to 10mLs in mouth for 60 seconds, every 4 to 6 hours as needed, then swallow or spit mixture out (as directed by prescriber). Shake well before using. Refrigerate after mixing and discard after 14 days., Disp: 100 mL, Rfl: 2 .  lidocaine -prilocaine  (EMLA ) 2.5-2.5 % cream, Apply to skin over port daily as needed prior to blood draw., Disp: 30 g, Rfl: 6 .  loperamide  (IMODIUM ) 2 mg capsule, Take 1 capsule (2 mg total) by mouth four (4) times a day as needed for diarrhea., Disp: , Rfl:  .  ondansetron  (ZOFRAN ) 8 MG tablet, Take 1 tablet (8 mg total) by mouth every eight (8) hours as needed for nausea., Disp: 30 tablet, Rfl: 2 .  ondansetron  (ZOFRAN -ODT) 8 MG disintegrating tablet, Take 1 tablet (8 mg total) by mouth every twelve (12) hours as needed for nausea., Disp: 30 tablet, Rfl: 1 .  pantoprazole  (PROTONIX ) 20 MG tablet, TAKE 1 TABLET BY MOUTH DAILY BEFORE BREAKFAST., Disp: 30 tablet, Rfl: 0 .  pantoprazole  (PROTONIX ) 20 MG tablet, Take 1 tablet (20 mg total) by mouth daily before breakfast., Disp: 90 tablet, Rfl: 3 .  polyethylene glycol (MIRALAX ) 17 gram packet, Take 17 g by mouth daily., Disp: , Rfl:  .  prochlorperazine  (COMPAZINE ) 10 MG tablet, Take 1 tablet (10 mg total) by mouth every six (6) hours as needed for nausea., Disp: 60 tablet, Rfl: 0 .  promethazine  (PHENERGAN ) 25 MG tablet, Take 1 tablet (  25 mg total) by mouth every six (6) hours as needed for nausea., Disp: 28 tablet, Rfl: 2 .  traMADol  (ULTRAM ) 50 mg tablet, Take 1 tablet (50 mg total) by mouth every six (6) hours as needed for pain., Disp: 20 tablet, Rfl: 0 .  vitamin B complex tablet, Take 1 tablet by mouth daily.,  Disp: , Rfl:  No current facility-administered medications for this visit.  Facility-Administered Medications Ordered in Other Visits:  .  acetaminophen  (TYLENOL ) tablet 650 mg, 650 mg, Oral, Once, Akhama, [place]berry Casino, FNP .  cetirizine (ZYRTEC) tablet 10 mg, 10 mg, Oral, Once, Akhama, Asberry Casino, FNP .  diphenhydrAMINE  (BENADRYL ) injection 25 mg, 25 mg, Intravenous, Once PRN, Tolley, Robyn J, AGNP .  EPINEPHrine  (EPIPEN ) injection 0.3 mg, 0.3 mg, Intramuscular, Once PRN, Tolley, Robyn J, AGNP .  famotidine (PF) (PEPCID) injection 20 mg, 20 mg, Intravenous, Once PRN, Tolley, Robyn J, AGNP .  heparin , porcine (PF) 100 unit/mL injection 500 Units, 500 Units, Intravenous, Q30 Min PRN, Chandra Aloysius BROCKS, MD, 500 Units at 12/19/23 1439 .  sodium chloride  0.9% (NS) bolus 1,000 mL, 1,000 mL, Intravenous, Once PRN, Tolley, Robyn J, AGNP  Medical History: Reviewed. No significant PMHx.  Social History: Lives by herself in [place], [place]. No children. Widowed.   Family History: No family history of blood cancer.  ECOG Performance Status: 2  GRADE ECOG PERFORMANCE STATUS  0 Fully active, able to carry on all pre-disease performance without restriction  1 Restricted in physically strenuous activity but ambulatory and able to carry out work of a light or sedentary nature, e.g., light house work, office work  2 Ambulatory and capable of all selfcare but unable to carry out any work activities; up and about more than 50% of waking hours  3 Capable of only limited selfcare; confined to bed or chair more than 50% of waking hours  4 Completely disabled; cannot carry on any selfcare; totally confined to bed or chair  5 Dead   Objective:  BP 105/63   Pulse 102   Temp 36.2 C (97.1 F) (Temporal)   Resp 18   Wt 64.4 kg (141 lb 15.6 oz)   SpO2 100%   BMI 25.09 kg/m   Physical Exam: GENERAL: Tired-appearing, accompanied by her friend. HEART: Normal color, not excessive  pallor. CHEST/LUNG: Normal work of breathing. No dyspnea with conversation. Diminished airway sounds in the right lower lobe.  ABDOMEN: No obvious distention.   EXTREMITIES: No edema, cyanosis or clubbing.  SKIN: Bruising and tenderness around her right sided port site.  NEURO EXAM: Grossly intact.   Test Results: Results for orders placed or performed in visit on 12/16/23  ECG 12 Lead  Result Value Ref Range   EKG Systolic BP  mmHg   EKG Diastolic BP  mmHg   EKG Ventricular Rate 92 BPM   EKG Atrial Rate 92 BPM   EKG P-R Interval 162 ms   EKG QRS Duration 88 ms   EKG Q-T Interval 348 ms   EKG QTC Calculation 430 ms   EKG Calculated P Axis 46 degrees   EKG Calculated R Axis 27 degrees   EKG Calculated T Axis 35 degrees   QTC Fredericia 401 ms  Results for orders placed or performed during the hospital encounter of 12/16/23  Platelet count  Result Value Ref Range   Platelet 21 (L) 150 - 450 10*9/L  Prepare Platelet Pheresis  Result Value Ref Range   PRODUCT CODE [id number]    Product  ID Platelets    Status Transfused    Unit # [id number]    Unit Blood Type A Pos    ISBT Number 6200   Results for orders placed or performed in visit on 12/16/23  Comprehensive Metabolic Panel  Result Value Ref Range   Sodium 130 (L) 135 - 145 mmol/L   Potassium 4.0 3.4 - 4.8 mmol/L   Chloride 94 (L) 98 - 107 mmol/L   CO2 24.0 20.0 - 31.0 mmol/L   Anion Gap 12 5 - 14 mmol/L   BUN 17 9 - 23 mg/dL   Creatinine 9.36 9.44 - 1.02 mg/dL   BUN/Creatinine Ratio 27    eGFR CKD-EPI (2021) Female >90 >=60 mL/min/1.52m2   Glucose 107 70 - 179 mg/dL   Calcium 9.0 8.7 - 89.5 mg/dL   Albumin 3.5 3.4 - 5.0 g/dL   Total Protein 7.1 5.7 - 8.2 g/dL   Total Bilirubin 0.5 0.3 - 1.2 mg/dL   AST 47 (H) <=65 U/L   ALT 61 (H) 10 - 49 U/L   Alkaline Phosphatase 147 (H) 46 - 116 U/L  Type and Screen with Confirmation ABORh  Result Value Ref Range   ABO Grouping A POS    Antibody Screen NEG   CBC w/  Differential  Result Value Ref Range   WBC 43.1 (H) 3.6 - 11.2 10*9/L   RBC 2.13 (L) 3.95 - 5.13 10*12/L   HGB 6.3 (L) 11.3 - 14.9 g/dL   HCT 81.5 (L) 65.9 - 55.9 %   MCV 86.4 77.6 - 95.7 fL   MCH 29.8 25.9 - 32.4 pg   MCHC 34.5 32.0 - 36.0 g/dL   RDW 83.2 (H) 87.7 - 84.7 %   MPV 8.5 6.8 - 10.7 fL   Platelet 6 (LL) 150 - 450 10*9/L   Anisocytosis Slight (A) Not Present  Manual Differential  Result Value Ref Range   Neutrophils % 10 %   Lymphocytes % 24 %   Monocytes % 8 %   Eosinophils % 1 %   Basophils % 0 %   Blasts % 57 (HH) <=0 %   Absolute Neutrophils 4.3 1.8 - 7.8 10*9/L   Absolute Lymphocytes 10.3 (H) 1.1 - 3.6 10*9/L   Absolute Monocytes 3.4 (H) 0.3 - 0.8 10*9/L   Absolute Eosinophils 0.4 0.0 - 0.5 10*9/L   Absolute Basophils 0.0 0.0 - 0.1 10*9/L   Smear Review Comments See Comment Undefined   Neutrophil Left Shift Present (A) Not Present

## 2023-12-16 NOTE — Progress Notes (Signed)
 Pt presents to the clinic, room 2 for the following. Treatment: 1 unit plts,    Data:  vital signs stable, review of systems completed. Pt complains of chest pain during MD appointment shows NSR. Chest Xray ordered & EKG. EKG done at infusion appointment. Labs reviewed. Plts 6 & Hgb 6.3 but due to WBC count >30 per primary team no PRBCs, adverse drug reactions reviewed. IV access Right chest port.  Action:  Unit double checked with another RN,  pre-treatment medications administered by pt prior to infusion appointment (650mg  tylenol  & 10mg  zyrtec) & positive blood return during administration. Plt redraw was 21 per therapy plan orders doesn't need another unit of plts  Response: Pt tolerated without complaints. No complications noted, positive blood return, no redness or edema at Right chest-wall port-a-cath Right chest-wall port-a-cath was de-accessed & saline locked d/t heparin  allergy locked.  Site was CDI and free from s/sx of infection.   AVS was printed.  Pt was discharged in stable condition ambulatory.

## 2023-12-19 NOTE — Progress Notes (Signed)
 Lab Results  Component Value Date   WBC 49.8 (H) 12/19/2023   HGB 5.9 (L) 12/19/2023   HCT 17.1 (L) 12/19/2023   PLT 25 (L) 12/19/2023     Patient received 1 unit(s) of Platelets for a Platelet level of 12.   Patient was not premedicated.  Transfusion started. Vital signs obtained per policy. Patient tolerated transfusion without issues. See Flowsheet for further details.  Platelet recount cam back to 25, per therapy plan above parameters.   Patient stable at time of discharge. Patient ambulated by wheelchair with friend to lobby.

## 2023-12-21 ENCOUNTER — Emergency Department (HOSPITAL_COMMUNITY)

## 2023-12-21 ENCOUNTER — Encounter (HOSPITAL_COMMUNITY): Payer: Self-pay | Admitting: Internal Medicine

## 2023-12-21 ENCOUNTER — Inpatient Hospital Stay (HOSPITAL_COMMUNITY)
Admission: EM | Admit: 2023-12-21 | Discharge: 2023-12-23 | DRG: 835 | Disposition: A | Discharge status: Home / Self Care | Attending: Family Medicine | Admitting: Family Medicine

## 2023-12-21 ENCOUNTER — Other Ambulatory Visit: Payer: Self-pay

## 2023-12-21 DIAGNOSIS — F17210 Nicotine dependence, cigarettes, uncomplicated: Secondary | ICD-10-CM | POA: Diagnosis present

## 2023-12-21 DIAGNOSIS — R0789 Other chest pain: Secondary | ICD-10-CM

## 2023-12-21 DIAGNOSIS — J9811 Atelectasis: Secondary | ICD-10-CM | POA: Diagnosis present

## 2023-12-21 DIAGNOSIS — R0902 Hypoxemia: Secondary | ICD-10-CM | POA: Diagnosis present

## 2023-12-21 DIAGNOSIS — E876 Hypokalemia: Secondary | ICD-10-CM | POA: Diagnosis present

## 2023-12-21 DIAGNOSIS — C9500 Acute leukemia of unspecified cell type not having achieved remission: Secondary | ICD-10-CM | POA: Diagnosis not present

## 2023-12-21 DIAGNOSIS — D696 Thrombocytopenia, unspecified: Secondary | ICD-10-CM | POA: Diagnosis present

## 2023-12-21 DIAGNOSIS — K219 Gastro-esophageal reflux disease without esophagitis: Secondary | ICD-10-CM | POA: Diagnosis present

## 2023-12-21 DIAGNOSIS — R079 Chest pain, unspecified: Secondary | ICD-10-CM | POA: Diagnosis present

## 2023-12-21 DIAGNOSIS — Z888 Allergy status to other drugs, medicaments and biological substances status: Secondary | ICD-10-CM | POA: Diagnosis not present

## 2023-12-21 DIAGNOSIS — Z885 Allergy status to narcotic agent status: Secondary | ICD-10-CM

## 2023-12-21 DIAGNOSIS — Z809 Family history of malignant neoplasm, unspecified: Secondary | ICD-10-CM | POA: Diagnosis not present

## 2023-12-21 DIAGNOSIS — J90 Pleural effusion, not elsewhere classified: Secondary | ICD-10-CM | POA: Diagnosis present

## 2023-12-21 DIAGNOSIS — Z8249 Family history of ischemic heart disease and other diseases of the circulatory system: Secondary | ICD-10-CM

## 2023-12-21 DIAGNOSIS — C9200 Acute myeloblastic leukemia, not having achieved remission: Secondary | ICD-10-CM | POA: Diagnosis present

## 2023-12-21 DIAGNOSIS — D649 Anemia, unspecified: Principal | ICD-10-CM

## 2023-12-21 DIAGNOSIS — Z9049 Acquired absence of other specified parts of digestive tract: Secondary | ICD-10-CM | POA: Diagnosis not present

## 2023-12-21 DIAGNOSIS — Z1152 Encounter for screening for COVID-19: Secondary | ICD-10-CM

## 2023-12-21 DIAGNOSIS — E871 Hypo-osmolality and hyponatremia: Secondary | ICD-10-CM | POA: Diagnosis present

## 2023-12-21 DIAGNOSIS — Z880 Allergy status to penicillin: Secondary | ICD-10-CM | POA: Diagnosis not present

## 2023-12-21 LAB — HEPATIC FUNCTION PANEL
ALT: 41 U/L (ref 0–44)
AST: 27 U/L (ref 15–41)
Albumin: 3.3 g/dL — ABNORMAL LOW (ref 3.5–5.0)
Alkaline Phosphatase: 100 U/L (ref 38–126)
Bilirubin, Direct: 0.2 mg/dL (ref 0.0–0.2)
Indirect Bilirubin: 0.2 mg/dL — ABNORMAL LOW (ref 0.3–0.9)
Total Bilirubin: 0.4 mg/dL (ref 0.0–1.2)
Total Protein: 5.8 g/dL — ABNORMAL LOW (ref 6.5–8.1)

## 2023-12-21 LAB — CBC
HCT: 13.9 % — ABNORMAL LOW (ref 36.0–46.0)
HCT: 16.7 % — ABNORMAL LOW (ref 36.0–46.0)
Hemoglobin: 4.6 g/dL — CL (ref 12.0–15.0)
Hemoglobin: 5.6 g/dL — CL (ref 12.0–15.0)
MCH: 29.3 pg (ref 26.0–34.0)
MCH: 29.6 pg (ref 26.0–34.0)
MCHC: 33.1 g/dL (ref 30.0–36.0)
MCHC: 33.5 g/dL (ref 30.0–36.0)
MCV: 88.5 fL (ref 80.0–100.0)
MCV: 88.4 fL (ref 80.0–100.0)
Platelets: 14 K/uL — CL (ref 150–400)
Platelets: 37 K/uL — ABNORMAL LOW (ref 150–400)
RBC: 1.57 MIL/uL — ABNORMAL LOW (ref 3.87–5.11)
RBC: 1.89 MIL/uL — ABNORMAL LOW (ref 3.87–5.11)
RDW: 16.6 % — ABNORMAL HIGH (ref 11.5–15.5)
RDW: 15.8 % — ABNORMAL HIGH (ref 11.5–15.5)
WBC: 65.2 K/uL (ref 4.0–10.5)
WBC: 60.2 K/uL (ref 4.0–10.5)
nRBC: 0.2 % (ref 0.0–0.2)
nRBC: 0.2 % (ref 0.0–0.2)

## 2023-12-21 LAB — LIPASE, BLOOD: Lipase: 26 U/L (ref 11–51)

## 2023-12-21 LAB — BASIC METABOLIC PANEL WITH GFR
Anion gap: 12 (ref 5–15)
BUN: 14 mg/dL (ref 8–23)
CO2: 22 mmol/L (ref 22–32)
Calcium: 8.4 mg/dL — ABNORMAL LOW (ref 8.9–10.3)
Chloride: 92 mmol/L — ABNORMAL LOW (ref 98–111)
Creatinine, Ser: 0.65 mg/dL (ref 0.44–1.00)
GFR, Estimated: >60 mL/min (ref >60)
Glucose, Bld: 116 mg/dL — ABNORMAL HIGH (ref 70–99)
Potassium: 3.2 mmol/L — ABNORMAL LOW (ref 3.5–5.1)
Sodium: 126 mmol/L — ABNORMAL LOW (ref 135–145)

## 2023-12-21 LAB — RESP PANEL BY RT-PCR (RSV, FLU A&B, COVID)  RVPGX2
Influenza A by PCR: NEGATIVE
Influenza B by PCR: NEGATIVE
Resp Syncytial Virus by PCR: NEGATIVE
SARS Coronavirus 2 by RT PCR: NEGATIVE

## 2023-12-21 LAB — TROPONIN T, HIGH SENSITIVITY
Troponin T High Sensitivity: <15 ng/L (ref 0–19)
Troponin T High Sensitivity: <15 ng/L (ref 0–19)

## 2023-12-21 LAB — PREPARE RBC (CROSSMATCH)

## 2023-12-21 MED ORDER — FENTANYL CITRATE PF 50 MCG/ML IJ SOSY
50.0000 ug | PREFILLED_SYRINGE | Freq: Once | INTRAMUSCULAR | Status: AC
  Administered 2023-12-21: 50 ug via INTRAVENOUS
  Filled 2023-12-21: qty 1

## 2023-12-21 MED ORDER — LIDOCAINE-PRILOCAINE 2.5-2.5 % EX CREA
TOPICAL_CREAM | Freq: Once | CUTANEOUS | Status: AC
  Filled 2023-12-21: qty 5

## 2023-12-21 MED ORDER — LEVOFLOXACIN 500 MG PO TABS
250.0000 mg | ORAL_TABLET | Freq: Every day | ORAL | Status: DC
  Administered 2023-12-21 – 2023-12-23 (×3): 250 mg via ORAL
  Filled 2023-12-21 (×3): qty 1

## 2023-12-21 MED ORDER — ONDANSETRON HCL 4 MG/2ML IJ SOLN
4.0000 mg | Freq: Four times a day (QID) | INTRAMUSCULAR | Status: DC | PRN
  Administered 2023-12-21 – 2023-12-22 (×6): 4 mg via INTRAVENOUS
  Filled 2023-12-21 (×6): qty 2

## 2023-12-21 MED ORDER — PANTOPRAZOLE SODIUM 40 MG PO TBEC
40.0000 mg | DELAYED_RELEASE_TABLET | Freq: Every day | ORAL | Status: DC
  Administered 2023-12-21 – 2023-12-23 (×3): 40 mg via ORAL
  Filled 2023-12-21 (×3): qty 1

## 2023-12-21 MED ORDER — FENTANYL CITRATE PF 50 MCG/ML IJ SOSY
12.5000 ug | PREFILLED_SYRINGE | INTRAMUSCULAR | Status: DC | PRN
  Administered 2023-12-21: 25 ug via INTRAVENOUS
  Administered 2023-12-21: 50 ug via INTRAVENOUS
  Administered 2023-12-21: 12.5 ug via INTRAVENOUS
  Administered 2023-12-21 (×3): 25 ug via INTRAVENOUS
  Administered 2023-12-22 – 2023-12-23 (×6): 50 ug via INTRAVENOUS
  Filled 2023-12-21 (×12): qty 1

## 2023-12-21 MED ORDER — NICOTINE 14 MG/24HR TD PT24
14.0000 mg | MEDICATED_PATCH | Freq: Every day | TRANSDERMAL | Status: DC
  Administered 2023-12-21 – 2023-12-23 (×3): 14 mg via TRANSDERMAL
  Filled 2023-12-21 (×3): qty 1

## 2023-12-21 MED ORDER — SODIUM CHLORIDE 0.9% FLUSH: 10.0000 mL | INTRAVENOUS | Status: DC | PRN

## 2023-12-21 MED ORDER — ALBUTEROL SULFATE (2.5 MG/3ML) 0.083% IN NEBU: 2.5000 mg | INHALATION_SOLUTION | RESPIRATORY_TRACT | Status: DC | PRN

## 2023-12-21 MED ORDER — ALPRAZOLAM 0.25 MG PO TABS
0.2500 mg | ORAL_TABLET | Freq: Three times a day (TID) | ORAL | Status: DC | PRN
  Administered 2023-12-21 – 2023-12-23 (×3): 0.25 mg via ORAL
  Filled 2023-12-21 (×3): qty 1

## 2023-12-21 MED ORDER — ONDANSETRON HCL 4 MG PO TABS
4.0000 mg | ORAL_TABLET | Freq: Four times a day (QID) | ORAL | Status: DC | PRN
  Administered 2023-12-23: 4 mg via ORAL
  Filled 2023-12-21: qty 1

## 2023-12-21 MED ORDER — ACETAMINOPHEN 650 MG RE SUPP: 650.0000 mg | Freq: Four times a day (QID) | RECTAL | Status: DC | PRN

## 2023-12-21 MED ORDER — CHLORHEXIDINE GLUCONATE CLOTH 2 % EX PADS
6.0000 | MEDICATED_PAD | Freq: Every day | CUTANEOUS | Status: DC
  Administered 2023-12-22 – 2023-12-23 (×2): 6 via TOPICAL

## 2023-12-21 MED ORDER — LIDOCAINE-EPINEPHRINE-TETRACAINE (LET) TOPICAL GEL
3.0000 mL | Freq: Once | TOPICAL | Status: DC
Start: 2023-12-21 — End: 2023-12-21

## 2023-12-21 MED ORDER — ACETAMINOPHEN 325 MG PO TABS
650.0000 mg | ORAL_TABLET | Freq: Four times a day (QID) | ORAL | Status: DC | PRN
  Administered 2023-12-22 – 2023-12-23 (×3): 650 mg via ORAL
  Filled 2023-12-21 (×3): qty 2

## 2023-12-21 MED ORDER — OXYCODONE HCL 5 MG PO TABS
5.0000 mg | ORAL_TABLET | ORAL | Status: DC | PRN
  Filled 2023-12-21: qty 1

## 2023-12-21 MED ORDER — SODIUM CHLORIDE 0.9% IV SOLUTION: Freq: Once | INTRAVENOUS | Status: AC

## 2023-12-21 MED ORDER — PROMETHAZINE HCL 25 MG PO TABS
25.0000 mg | ORAL_TABLET | Freq: Once | ORAL | Status: AC
  Administered 2023-12-21: 25 mg via ORAL
  Filled 2023-12-21: qty 1

## 2023-12-21 MED ORDER — HYDROXYUREA 500 MG PO CAPS: 1000.0000 mg | ORAL_CAPSULE | Freq: Four times a day (QID) | ORAL | Status: DC

## 2023-12-21 MED ORDER — HYDROMORPHONE HCL 1 MG/ML IJ SOLN
0.5000 mg | Freq: Once | INTRAMUSCULAR | Status: AC
  Administered 2023-12-21: 0.5 mg via INTRAVENOUS
  Filled 2023-12-21: qty 1

## 2023-12-21 NOTE — ED Notes (Signed)
 Pt states feeling SOB. SpO2 bouncing between 88-90%. Patient put on 2L nasal cannula supplemental oxygen.

## 2023-12-21 NOTE — ED Notes (Signed)
 Patient transported to X-ray

## 2023-12-21 NOTE — ED Triage Notes (Signed)
 POV, complain of chest pain since Friday, shortness of breath present, progressively worse, feels like heaviness, nausea present as well.  Pt was unable to tolerate Lasix 40mg  PO due not feeling right.     Pt alert and oriented on triage.  Hx Leukemia, usually receives two blood transfusion a week, hasn't being able to received blood since two weeks due high WBC numbers. Pt received one bag of platelets on Friday.

## 2023-12-21 NOTE — Progress Notes (Addendum)
 Patient c/o pain to left chest/shoulder area, says it has been ongoing since arriving to ED but it is new today. Rates pain 8-9 out of 10 with movement, stabbing/constant, but worse with any movement. Says pain is radiating to back. Says her shoulder feels dislocated. No obvious signs of injury. MD Zella made aware. PRN Fentanyl  given for pain. Pt declines use of ice pack. Will continue to monitor.

## 2023-12-21 NOTE — Progress Notes (Signed)
 Prior-To-Admission Oral Chemotherapy for Treatment of Oncologic Disease   Order noted from Dr. Zella to continue prior-to-admission oral chemotherapy regimen of hydroxyurea .  Procedure Per Pharmacy & Therapeutics Committee Policy: Orders for continuation of home oral chemotherapy for treatment of an oncologic disease will be held unless approved by an oncologist during current admission.    For patients receiving oncology care at [facility], inpatient pharmacist contacts patient's oncologist during regular office hours to review. If earlier review is medically necessary, attending physician consults [facility] on-call oncologist   For patients receiving oncology care outside of [facility], attending physician consults patient's oncologist to review. If this oncologist or their coverage cannot be reached, attending physician consults [facility] on-call oncologist   Oral chemotherapy continuation order is on hold pending oncologist review, Non-[facility] oncologist Dr. Chandra from [facility] provides oncology care and should be consulted by attending physician   Pharmacy to follow up on Monday, 9/15.  Stefano MARLA Bologna, PharmD, BCPS Clinical Pharmacist 12/21/2023 9:52 AM

## 2023-12-21 NOTE — ED Notes (Signed)
 Port accessed

## 2023-12-21 NOTE — ED Provider Notes (Signed)
 Emergency Department Provider Note   I have reviewed the triage vital signs and the nursing notes.   HISTORY  Chief Complaint Chest Pain   HPI [name] is a 68 y.o. female with PMH of AML followed at [facility] who presents to the emergency department with chest tightness, shortness of breath, nausea.  She followed with her team at [facility] with x-ray and blood work done last week.  She has a recurrent pleural effusion as well as worsening anemia.  Her WBC counts have been high and she has not been able to get blood transfusions over the past couple of weeks.  She did receive a unit of platelets last week due to known thrombocytopenia.  They attempted to manage her with Lasix at home but she states she took that and felt more fatigued and so presents tonight for evaluation.  Chest tightness is central and constant.   Past Medical History:  Diagnosis Date   GERD (gastroesophageal reflux disease)    Leukemia (HCC)     Review of Systems  Constitutional: No fever/chills Cardiovascular: Positive chest pain. Respiratory: Positive shortness of breath. Gastrointestinal: No abdominal pain.  Positive nausea, no vomiting.   Skin: Negative for rash. Neurological: Negative for headaches. ____________________________________________   PHYSICAL EXAM:  VITAL SIGNS: ED Triage Vitals [12/21/23 0344]  Encounter Vitals Group     BP (!) 131/55     Pulse Rate (!) 102     Resp (!) 25     Temp (!) 97.5 F (36.4 C)     Temp Source Oral     SpO2 98 %   Constitutional: Alert and oriented. Well appearing and in no acute distress. Eyes: Conjunctivae are normal.  Head: Atraumatic. Nose: No congestion/rhinnorhea. Mouth/Throat: Mucous membranes are moist.  Neck: No stridor.   Cardiovascular: Normal rate, regular rhythm. Good peripheral circulation. Grossly normal heart sounds.   Respiratory: Normal respiratory effort.  No retractions. Lungs with decreased sounds at the right base.   Gastrointestinal: Soft and nontender. No distention.  Musculoskeletal:  No gross deformities of extremities. Neurologic:  Normal speech and language.  Skin:  Skin is warm, dry and intact. No rash noted.  ____________________________________________   LABS (all labs ordered are listed, but only abnormal results are displayed)  Labs Reviewed  BASIC METABOLIC PANEL WITH GFR - Abnormal; Notable for the following components:      Result Value   Sodium 126 (*)    Potassium 3.2 (*)    Chloride 92 (*)    Glucose, Bld 116 (*)    Calcium 8.4 (*)    All other components within normal limits  CBC - Abnormal; Notable for the following components:   WBC 65.2 (*)    RBC 1.57 (*)    Hemoglobin 4.6 (*)    HCT 13.9 (*)    RDW 16.6 (*)    Platelets 14 (*)    All other components within normal limits  HEPATIC FUNCTION PANEL - Abnormal; Notable for the following components:   Total Protein 5.8 (*)    Albumin 3.3 (*)    Indirect Bilirubin 0.2 (*)    All other components within normal limits  CBC - Abnormal; Notable for the following components:   WBC 60.2 (*)    RBC 1.89 (*)    Hemoglobin 5.6 (*)    HCT 16.7 (*)    RDW 15.8 (*)    Platelets 37 (*)    All other components within normal limits  RESP PANEL BY  RT-PCR (RSV, FLU A&B, COVID)  RVPGX2  LIPASE, BLOOD  BASIC METABOLIC PANEL WITH GFR  CBC  TYPE AND SCREEN  PREPARE PLATELET PHERESIS  PREPARE RBC (CROSSMATCH)  TROPONIN T, HIGH SENSITIVITY  TROPONIN T, HIGH SENSITIVITY   ____________________________________________  EKG   EKG Interpretation Date/Time:  Sunday December 21 2023 03:43:31 EDT Ventricular Rate:  103 PR Interval:  168 QRS Duration:  92 QT Interval:  325 QTC Calculation: 426 R Axis:   66  Text Interpretation: Sinus tachycardia Borderline ST depression, lateral leads Confirmed by Darra Chew [id number]) on 12/21/2023 3:48:15 AM        ____________________________________________  RADIOLOGY  DG Chest 2  View Result Date: 12/21/2023 EXAM: 2 VIEW(S) XRAY OF THE CHEST 12/21/2023 04:52:11 AM COMPARISON: 09/26/2023 CLINICAL HISTORY: Chest pain. Complain of chest pain since Friday, shortness of breath present, progressively worse, feels like heaviness, nausea present as well. FINDINGS: LINES, TUBES AND DEVICES: Right IJ port catheter in place to distal SVC. LUNGS AND PLEURA: Chronic coarsened interstitial markings identified bilaterally. Bibasilar atelectasis or opacities. Small bilateral pleural effusions. HEART AND MEDIASTINUM: Heart size at upper limits of normal. BONES AND SOFT TISSUES: No acute osseous abnormality. IMPRESSION: 1. Bibasilar atelectasis and small bilateral pleural effusions. Electronically signed by: Waddell Calk MD 12/21/2023 05:38 AM EDT RP Workstation: [id number]    ____________________________________________   PROCEDURES  Procedure(s) performed:   Procedures  CRITICAL CARE Performed by: Chew KANDICE Darra Total critical care time: 35 minutes Critical care time was exclusive of separately billable procedures and treating other patients. Critical care was necessary to treat or prevent imminent or life-threatening deterioration. Critical care was time spent personally by me on the following activities: development of treatment plan with patient and/or surrogate as well as nursing, discussions with consultants, evaluation of patient's response to treatment, examination of patient, obtaining history from patient or surrogate, ordering and performing treatments and interventions, ordering and review of laboratory studies, ordering and review of radiographic studies, pulse oximetry and re-evaluation of patient's condition.  Chew Darra, MD Emergency Medicine  ____________________________________________   INITIAL IMPRESSION / ASSESSMENT AND PLAN / ED COURSE  Pertinent labs & imaging results that were available during my care of the patient were reviewed by me and considered in my  medical decision making (see chart for details).   This patient is Presenting for Evaluation of CP, which does require a range of treatment options, and is a complaint that involves a high risk of morbidity and mortality.  The Differential Diagnoses includes but is not exclusive to acute coronary syndrome, aortic dissection, pulmonary embolism, cardiac tamponade, community-acquired pneumonia, pericarditis, musculoskeletal chest wall pain, etc.   Critical Interventions-    Medications  levofloxacin  (LEVAQUIN ) tablet 250 mg (250 mg Oral Given 12/21/23 1052)  ALPRAZolam  (XANAX ) tablet 0.25 mg (0.25 mg Oral Given 12/21/23 2020)  pantoprazole  (PROTONIX ) EC tablet 40 mg (40 mg Oral Given 12/21/23 1052)  acetaminophen  (TYLENOL ) tablet 650 mg (has no administration in time range)    Or  acetaminophen  (TYLENOL ) suppository 650 mg (has no administration in time range)  ondansetron  (ZOFRAN ) tablet 4 mg ( Oral See Alternative 12/21/23 2221)    Or  ondansetron  (ZOFRAN ) injection 4 mg (4 mg Intravenous Given 12/21/23 2221)  albuterol  (PROVENTIL ) (2.5 MG/3ML) 0.083% nebulizer solution 2.5 mg (has no administration in time range)  oxyCODONE  (Oxy IR/ROXICODONE ) immediate release tablet 5 mg (has no administration in time range)  fentaNYL  (SUBLIMAZE ) injection 12.5-50 mcg (50 mcg Intravenous Given 12/21/23 2221)  Chlorhexidine  Gluconate Cloth  2 % PADS 6 each (has no administration in time range)  sodium chloride  flush (NS) 0.9 % injection 10-40 mL (has no administration in time range)  nicotine  (NICODERM CQ  - dosed in mg/24 hours) patch 14 mg (14 mg Transdermal Patch Applied 12/21/23 2020)  fentaNYL  (SUBLIMAZE ) injection 50 mcg (50 mcg Intravenous Given 12/21/23 0440)  promethazine  (PHENERGAN ) tablet 25 mg (25 mg Oral Given 12/21/23 0414)  lidocaine -prilocaine  (EMLA ) cream ( Topical Given 12/21/23 0431)  HYDROmorphone  (DILAUDID ) injection 0.5 mg (0.5 mg Intravenous Given 12/21/23 0602)  0.9 %  sodium chloride   infusion (Manually program via Guardrails IV Fluids) (0 mLs Intravenous Stopped 12/21/23 1059)    Reassessment after intervention: symptoms improved.    I did obtain Additional Historical Information from family at bedside.  I decided to review pertinent External Data, and in summary followed for AML at [facility]   Clinical Laboratory Tests Ordered, included CBC with leukocytosis to 65k. Severe anemia < 5. COVID negative. Troponin negative.   Radiologic Tests Ordered, included CXR. I independently interpreted the images and agree with radiology interpretation.   Cardiac Monitor Tracing which shows sinus tachycardia.    Social Determinants of Health Risk patient with a smoking history.   Consult complete with [facility], Dr. Debby. Plan for admit.   Oncology, Dr. Autumn.  Patient okay to receive 1 unit PRBC.  Platelet count of 14 is okay but given possibility of thoracentesis that she required last time I have ordered 2 units of platelets.   Medical Decision Making: Summary:  Patient presents to the emergency department for evaluation of chest tightness and shortness of breath.  She has a known recurrent pleural effusion and issues with anemia and thrombocytopenia.  All of these could be contributing to her current symptoms.  No acute ischemic change on EKG in triage.  Reevaluation with update and discussion with patient. Plan for PRBC and platelets. Plan for admit. She is in agreement.   Patient's presentation is most consistent with acute presentation with potential threat to life or bodily function.   Disposition: admit  ____________________________________________  FINAL CLINICAL IMPRESSION(S) / ED DIAGNOSES  Final diagnoses:  Symptomatic anemia  Thrombocytopenia (HCC)  Atypical chest pain     Note:  This document was prepared using Dragon voice recognition software and may include unintentional dictation errors.  Fonda Law, MD, [facility] Emergency Medicine    Nini Cavan, Fonda MATSU,  MD 12/21/23 [phone]

## 2023-12-21 NOTE — H&P (Signed)
 History and Physical  [name] FMW:[id number] DOB: 06/05/1955 DOA: 12/21/2023  PCP: Melanee Annah BROCKS, MD   Chief Complaint: Shortness of breath, chest discomfort  HPI: [name] is a 68 y.o. 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  Patient is followed at [facility] oncology, she has not achieved remission and has declined offers for more aggressive therapies.  Currently she is on high dose of hydroxyurea , which she has trouble tolerating due to GI side effects, in an attempt to manage her chronic leukocytosis.  She was hospitalized here in June with a large right-sided pleural effusion, she returned to the ER for evaluation because she felt short of breath and chest discomfort similar to what she felt when she had her pleural effusion.  Workup in the ER as detailed below shows evidence of severe anemia, continued thrombocytopenia, and only small bilateral pleural effusions.  History is provided by the patient as well as her daughter who is at the bedside, they deny any worsening nausea/vomiting, fevers, bleeding, dysuria, diarrhea or other new concerns.  Review of Systems: Please see HPI for pertinent positives and negatives. A complete 10 system review of systems are otherwise negative.  Past Medical History:  Diagnosis Date   GERD (gastroesophageal reflux disease)    Leukemia (HCC)    Past Surgical History:  Procedure Laterality Date   CARDIAC SURGERY     CHOLECYSTECTOMY     HIP ARTHROSCOPY     IR IMAGING GUIDED PORT INSERTION  05/19/2023   surgery on left foot Left    Social History:  reports that she has been smoking cigarettes. She has a 1 pack-year smoking history. She has never used smokeless tobacco. She reports that she does not currently use alcohol. She reports that she does  not currently use drugs.  Allergies  Allergen Reactions   Penicillin G Anaphylaxis   Prednisone Anaphylaxis    Pt states it turned her into a monster, lips started swelling and couldn't breath   Codeine    Heparin      Family History  Problem Relation Age of Onset   Cancer Mother    Heart failure Father    Heart failure Sister    Cancer Maternal Grandmother    Cancer Maternal Grandfather      Prior to Admission medications   Medication Sig Start Date End Date Taking? Authorizing Provider  furosemide (LASIX) 40 MG tablet Take 40 mg by mouth. 12/19/23 12/26/23 Yes [provider]  ondansetron  (ZOFRAN -ODT) 8 MG disintegrating tablet Take 8 mg by mouth. 12/15/23 12/14/24 Yes [provider]  prochlorperazine  (COMPAZINE ) 10 MG tablet Take 10 mg by mouth. 12/16/23  Yes [provider]  promethazine  (PHENERGAN ) 25 MG tablet Take 25 mg by mouth. 12/15/23  Yes [provider]  albuterol  (PROVENTIL ) (2.5 MG/3ML) 0.083% nebulizer solution Inhale 3 mLs (2.5 mg total) into the lungs every 4 (four) hours as needed for wheezing or shortness of breath. Patient not taking: Reported on 09/24/2023 07/07/23   Swayze, Ava, DO  allopurinol  (ZYLOPRIM ) 300 MG tablet Take 1 tablet (300 mg total) by mouth daily. 05/08/23   Melanee Annah BROCKS, MD  ALPRAZolam  (XANAX ) 0.5 MG tablet Take 0.5 mg by mouth at bedtime as needed for sleep.    [provider]  ascorbic acid  (VITAMIN C ) 500 MG tablet Take  500 mg by mouth daily.    [provider]  b complex vitamins capsule Take 1 capsule by mouth daily.    [provider]  cholecalciferol  (VITAMIN D3) 10 MCG (400 UNIT) TABS tablet Take 400 Units by mouth.    [provider]  doxycycline  (MONODOX ) 100 MG capsule Take 100 mg by mouth 2 (two) times daily. 09/12/23   [provider]  hydroxyurea  (HYDREA ) 500 MG capsule Take 1,000 mg by mouth 2 (two) times daily. May take with food to minimize GI side effects.     [provider]  hydrOXYzine (ATARAX) 25 MG tablet Take 25 mg by mouth 3 (three) times daily as needed for anxiety. 06/09/23   [provider]  levofloxacin  (LEVAQUIN ) 500 MG tablet Take 500 mg by mouth daily.    [provider]  lidocaine  (LIDODERM ) 5 % Place 1 patch onto the skin daily. Remove & Discard patch within 12 hours or as directed by MD 09/27/23   Briana Elgin LABOR, MD  lidocaine -prilocaine  (EMLA ) cream Apply 1 Application topically as needed. 05/20/23   Melanee Annah BROCKS, MD  methocarbamol  (ROBAXIN ) 500 MG tablet Take 1 tablet (500 mg total) by mouth every 8 (eight) hours as needed for muscle spasms. 09/26/23   Briana Elgin LABOR, MD  nicotine  (NICODERM CQ  - DOSED IN MG/24 HOURS) 21 mg/24hr patch Place 1 patch (21 mg total) onto the skin daily. 07/08/23   Swayze, Ava, DO  ondansetron  (ZOFRAN ) 8 MG tablet TAKE ONE TABLET (8 MG TOTAL) BY MOUTH EVERY EIGHT HOURS AS NEEDED FOR NAUSEA OR VOMITING. 08/11/23   Melanee Annah BROCKS, MD  pantoprazole  (PROTONIX ) 20 MG tablet TAKE ONE TABLET (20 MG TOTAL) BY MOUTH DAILY. 07/23/23   Rao, Archana C, MD  polyethylene glycol (MIRALAX  / GLYCOLAX ) 17 g packet Take 17 g by mouth daily as needed for mild constipation.    [provider]  posaconazole  (NOXAFIL ) 100 MG TBEC delayed-release tablet Take 300 mg by mouth daily.    [provider]  traMADol  (ULTRAM ) 50 MG tablet Take 50 mg by mouth every 6 (six) hours as needed for moderate pain (pain score 4-6). 09/23/23 09/22/24  [provider]   Physical Exam: BP 120/62   Pulse 95   Temp 98 F (36.7 C) (Oral)   Resp 19   SpO2 93%  General:  Alert, oriented, calm, in no acute distress, looks chronically ill and her granddaughter is at the bedside. Eyes: EOMI, clear conjuctivae, white sclerea Neck: supple, no masses, trachea mildline  Cardiovascular: RRR, no murmurs or rubs, no peripheral edema  Respiratory: clear to auscultation bilaterally, no wheezes, no crackles  Abdomen:  soft, nontender, nondistended, normal bowel tones heard  Skin: dry, no rashes  Musculoskeletal: no joint effusions, normal range of motion  Psychiatric: appropriate affect, normal speech  Neurologic: extraocular muscles intact, clear speech, moving all extremities with intact sensorium         Labs on Admission:  Basic Metabolic Panel: Recent Labs  Lab 12/21/23 0440  NA 126*  K 3.2*  CL 92*  CO2 22  GLUCOSE 116*  BUN 14  CREATININE 0.65  CALCIUM 8.4*   Liver Function Tests: Recent Labs  Lab 12/21/23 0440  AST 27  ALT 41  ALKPHOS 100  BILITOT 0.4  PROT 5.8*  ALBUMIN 3.3*   Recent Labs  Lab 12/21/23 0440  LIPASE 26   No results for input(s): AMMONIA in the last 168 hours. CBC: Recent Labs  Lab  12/21/23 0440  WBC 65.2*  HGB 4.6*  HCT 13.9*  MCV 88.5  PLT 14*   Cardiac Enzymes: No results for input(s): CKTOTAL, CKMB, CKMBINDEX, TROPONINI in the last 168 hours. BNP (last 3 results) No results for input(s): BNP in the last 8760 hours.  ProBNP (last 3 results) No results for input(s): PROBNP in the last 8760 hours.  CBG: No results for input(s): GLUCAP in the last 168 hours.  Radiological Exams on Admission: DG Chest 2 View Result Date: 12/21/2023 EXAM: 2 VIEW(S) XRAY OF THE CHEST 12/21/2023 04:52:11 AM COMPARISON: 09/26/2023 CLINICAL HISTORY: Chest pain. Complain of chest pain since Friday, shortness of breath present, progressively worse, feels like heaviness, nausea present as well. FINDINGS: LINES, TUBES AND DEVICES: Right IJ port catheter in place to distal SVC. LUNGS AND PLEURA: Chronic coarsened interstitial markings identified bilaterally. Bibasilar atelectasis or opacities. Small bilateral pleural effusions. HEART AND MEDIASTINUM: Heart size at upper limits of normal. BONES AND SOFT TISSUES: No acute osseous abnormality. IMPRESSION: 1. Bibasilar atelectasis and small bilateral pleural effusions. Electronically signed by: Waddell Calk MD  12/21/2023 05:38 AM EDT RP Workstation: [id number]   Assessment/Plan [name] is a 68 y.o. 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  No evidence of acute infection, cardiac problem or recurrent pleural effusion.  Symptoms likely due to worsening severe anemia.  Symptomatic anemia-due to her AML, with no evidence of bleeding.  She has required intermittent blood transfusions at [facility].  She is followed by Dr. Chandra.  Per his most recent documentation 12/16/2023, plan is to transfuse 1 unit of blood if hemoglobin less than 7 and WBC less than 30.  To consider 1 unit PRBC transfusion if WBC greater than 30 and very symptomatic.  Plan to transfuse 1 unit PRBC if hemoglobin less than 5, regardless of WBC. -Observation admission -Transfuse 1 unit PRBC -Check posttransfusion CBC this afternoon  Thrombocytopenia-due to her AML, with no evidence of active bleeding.  Per documentation recommendation from her hematologist Dr. Chandra, plan to transfuse platelets if less than 20. -Transfuse 1 unit of platelets -Check posttransfusion CBC this afternoon  AML-under the care of Dr. Chandra at [facility], currently on palliative therapies including hydroxyurea  2 g 4 times daily, with Levaquin . -Hydroxyurea  2 g 4 times daily -Levaquin  to 50 mg p.o. daily -Patient has outpatient follow-up appointment scheduled on 9/16  DVT prophylaxis: SCDs only    Code Status: Full Code  Consults called: Plan of care was discussed with Dr. Autumn, who does not have further recommendations at this time.  Admission status: Observation  Time spent: 49 minutes  Lalita Ebel CHRISTELLA Gail MD [facility] Pager [phone]  If 7PM-7AM, please contact night-coverage [email or web address] Password [email or web address]  12/21/2023, 7:47 AM

## 2023-12-22 DIAGNOSIS — D696 Thrombocytopenia, unspecified: Secondary | ICD-10-CM

## 2023-12-22 DIAGNOSIS — D649 Anemia, unspecified: Secondary | ICD-10-CM | POA: Diagnosis not present

## 2023-12-22 DIAGNOSIS — C9500 Acute leukemia of unspecified cell type not having achieved remission: Secondary | ICD-10-CM | POA: Diagnosis not present

## 2023-12-22 LAB — HEMOGLOBIN AND HEMATOCRIT, BLOOD
HCT: 22.8 % — ABNORMAL LOW (ref 36.0–46.0)
Hemoglobin: 8 g/dL — ABNORMAL LOW (ref 12.0–15.0)

## 2023-12-22 LAB — CBC
HCT: 16.2 % — ABNORMAL LOW (ref 36.0–46.0)
Hemoglobin: 5.4 g/dL — CL (ref 12.0–15.0)
MCH: 29.7 pg (ref 26.0–34.0)
MCHC: 33.3 g/dL (ref 30.0–36.0)
MCV: 89 fL (ref 80.0–100.0)
Platelets: 31 K/uL — ABNORMAL LOW (ref 150–400)
RBC: 1.82 MIL/uL — ABNORMAL LOW (ref 3.87–5.11)
RDW: 15.9 % — ABNORMAL HIGH (ref 11.5–15.5)
WBC: 67.4 K/uL (ref 4.0–10.5)
nRBC: 0.2 % (ref 0.0–0.2)

## 2023-12-22 LAB — PREPARE PLATELET PHERESIS
Unit division: 0
Unit division: 0
Unit division: 0

## 2023-12-22 LAB — BPAM PLATELET PHERESIS
Blood Product Expiration Date: 202509152359
Blood Product Expiration Date: 202509162359
Blood Product Expiration Date: 202509162359
ISSUE DATE / TIME: 202509140934
ISSUE DATE / TIME: 202509141321
Unit Type and Rh: 6200
Unit Type and Rh: 6200
Unit Type and Rh: 7300

## 2023-12-22 LAB — BASIC METABOLIC PANEL WITH GFR
Anion gap: 9 (ref 5–15)
BUN: 12 mg/dL (ref 8–23)
CO2: 25 mmol/L (ref 22–32)
Calcium: 8.4 mg/dL — ABNORMAL LOW (ref 8.9–10.3)
Chloride: 94 mmol/L — ABNORMAL LOW (ref 98–111)
Creatinine, Ser: 0.62 mg/dL (ref 0.44–1.00)
GFR, Estimated: >60 mL/min (ref >60)
Glucose, Bld: 92 mg/dL (ref 70–99)
Potassium: 3.4 mmol/L — ABNORMAL LOW (ref 3.5–5.1)
Sodium: 128 mmol/L — ABNORMAL LOW (ref 135–145)

## 2023-12-22 LAB — PREPARE RBC (CROSSMATCH)

## 2023-12-22 MED ORDER — SODIUM CHLORIDE 0.9% IV SOLUTION
Freq: Once | INTRAVENOUS | Status: AC
Start: 2023-12-22 — End: 2023-12-22

## 2023-12-22 NOTE — Progress Notes (Signed)
  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  Admitted for severe anemia and transfusion.  Consultants None   Procedures/Events None   Assessment and Plan: Symptomatic anemia, some chest discomfort Profound anemia AML not in remission Receives care at [facility], lives in [place], prefers [place] location for convenience Followed by Dr. Chandra at [facility], requires intermittent transfusions, was scheduled to receive platelets 9/16. Per most recent documentation 12/16/2023, plan is to transfuse 1 unit of blood if hemoglobin less than 7 and WBC less than 30.  To consider 1 unit PRBC transfusion if WBC greater than 30 and very symptomatic.  Plan to transfuse 1 unit PRBC if hemoglobin less than 5, regardless of WBC. Given no response to initial unit of blood will transfuse 2 units   Thrombocytopenia- Secondary to AML, status post 1 unit platelets 9/14. Per documentation recommendation from her hematologist Dr. Chandra, plan to transfuse platelets if less than 20. Follow CBC   AML under the care of Dr. Chandra at [facility], currently on palliative therapies including hydroxyurea  2 g 4 times daily, with Levaquin . Hydroxyurea   Levaquin  t Patient has outpatient follow-up appointment scheduled on 9/16, she will not make this appointment given the need for further blood  Hyponatremia Chronic in nature, appears to be close to baseline.  No further evaluation suggested.  Hypokalemia, replete      Subjective:  Feels okay, no dizziness or shortness of breath Reports she had an appointment to go for platelet transfusion tomorrow at [facility]  Physical Exam: Vitals:   12/22/23 0556 12/22/23 1029  12/22/23 1049 12/22/23 1236  BP: (!) 119/55 (!) 107/57 95/79 112/67  Pulse: 88 89 89 85  Resp: 18 17 18 20   Temp: 98.6 F (37 C) 98.9 F (37.2 C) 98.2 F (36.8 C) 98 F (36.7 C)  TempSrc: Oral Oral Oral Oral  SpO2: 96% 100% 99% 100%  Weight:      Height:       Physical Exam Vitals reviewed.  Constitutional:      General: She is not in acute distress.    Appearance: She is not ill-appearing or toxic-appearing.  Cardiovascular:     Rate and Rhythm: Normal rate and regular rhythm.     Heart sounds: No murmur heard. Pulmonary:     Effort: Pulmonary effort is normal. No respiratory distress.     Breath sounds: No wheezing, rhonchi or rales.  Neurological:     Mental Status: She is alert.  Psychiatric:        Mood and Affect: Mood normal.        Behavior: Behavior normal.     Data Reviewed: Potassium 3.4 Sodium 128 WBC 67.4 Hemoglobin no change 5.4 Platelet count increased status post platelet transfusion, currently 51   Family Communication: none  Disposition: Status is: Inpatient Remains inpatient appropriate because: anemia     Time spent: 35 minutes  Author: Toribio Door, MD 12/22/2023 1:08 PM  For on call review [email or web address].

## 2023-12-22 NOTE — Progress Notes (Signed)
 Mobility Specialist - Progress Note    12/22/23 1015  Mobility  Activity Ambulated with assistance  Level of Assistance Contact guard assist, steadying assist  Assistive Device Other (Comment) (HHA)  Distance Ambulated (ft) 180 ft  Range of Motion/Exercises Active  Activity Response Tolerated well  Mobility Referral Yes  Mobility visit 1 Mobility  Mobility Specialist Start Time (ACUTE ONLY) 1000  Mobility Specialist Stop Time (ACUTE ONLY) 1015  Mobility Specialist Time Calculation (min) (ACUTE ONLY) 15 min   Pt was found in room and agreeable to mobilize in hallway. Stated still having L shoulder pain after pain meds taken. At EOS returned to bed with all needs met. Call bell in reach.  Erminio Leos,  Mobility Specialist Can be reached via Secure Chat

## 2023-12-22 NOTE — Progress Notes (Signed)
 Transition of Care [facility]) - Inpatient Brief Assessment  Patient Details  Name: KIARI HOSMER MRN: [id number] Date of Birth: 11/12/1955  Transition of Care [facility]) CM/SW Contact:    Duwaine GORMAN Aran, LCSW Phone Number: 12/22/2023, 8:36 AM  Clinical Narrative: Screening completed. No care management needs identified at this time.  Transition of Care Asessment: Insurance and Status: Insurance coverage has been reviewed Patient has primary care physician: Yes Home environment has been reviewed: Resides alone in single family home Prior level of function:: Independent with ADLs at baseline Prior/Current Home Services: No current home services Social Drivers of Health Review: SDOH reviewed no interventions necessary Readmission risk has been reviewed: Yes Transition of care needs: no transition of care needs at this time   12/22/23 0833  Readmission Prevention Plan - High Risk  Transportation Screening Complete  Home Care Consult (High Risk) Complete  High Risk Social Work Consult for recovery care planning/counseling (includes patient and caregiver) Complete  High Risk Palliative Care Screening Not Applicable  Medication Review Complete

## 2023-12-22 NOTE — Progress Notes (Signed)
 Mobility Specialist - Progress Note   12/22/23 0833  Mobility  Activity Ambulated independently  Level of Assistance Standby assist, set-up cues, supervision of patient - no hands on  Assistive Device None  Distance Ambulated (ft) 20 ft  Range of Motion/Exercises Active  Activity Response Tolerated well  Mobility visit 1 Mobility  Mobility Specialist Start Time (ACUTE ONLY) 0820  Mobility Specialist Stop Time (ACUTE ONLY) [id number]  Mobility Specialist Time Calculation (min) (ACUTE ONLY) 13 min   Pt was found in bed requesting assistance to bathroom. Stated only having gas. Retruned to bed with all needs met. Call bell in reach.   [name],  Mobility Specialist Can be reached via Secure Chat

## 2023-12-22 NOTE — Hospital Course (Addendum)
 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  Admitted for severe anemia and transfusion.  Consultants None   Procedures/Events None

## 2023-12-23 ENCOUNTER — Other Ambulatory Visit: Payer: Self-pay

## 2023-12-23 DIAGNOSIS — D696 Thrombocytopenia, unspecified: Secondary | ICD-10-CM | POA: Diagnosis not present

## 2023-12-23 DIAGNOSIS — C9500 Acute leukemia of unspecified cell type not having achieved remission: Secondary | ICD-10-CM | POA: Diagnosis not present

## 2023-12-23 DIAGNOSIS — D649 Anemia, unspecified: Secondary | ICD-10-CM | POA: Diagnosis not present

## 2023-12-23 LAB — BPAM RBC
Blood Product Expiration Date: 202509192359
Blood Product Expiration Date: 202510072359
Blood Product Expiration Date: 202510072359
ISSUE DATE / TIME: 202509140702
ISSUE DATE / TIME: 202509151024
ISSUE DATE / TIME: 202509151249
Unit Type and Rh: 6200
Unit Type and Rh: 6200
Unit Type and Rh: 6200

## 2023-12-23 LAB — TYPE AND SCREEN
ABO/RH(D): A POS
Antibody Screen: NEGATIVE
Unit division: 0
Unit division: 0
Unit division: 0

## 2023-12-23 LAB — BASIC METABOLIC PANEL WITH GFR
Anion gap: 10 (ref 5–15)
BUN: 11 mg/dL (ref 8–23)
CO2: 25 mmol/L (ref 22–32)
Calcium: 8.3 mg/dL — ABNORMAL LOW (ref 8.9–10.3)
Chloride: 94 mmol/L — ABNORMAL LOW (ref 98–111)
Creatinine, Ser: 0.68 mg/dL (ref 0.44–1.00)
GFR, Estimated: >60 mL/min (ref >60)
Glucose, Bld: 89 mg/dL (ref 70–99)
Potassium: 3.4 mmol/L — ABNORMAL LOW (ref 3.5–5.1)
Sodium: 129 mmol/L — ABNORMAL LOW (ref 135–145)

## 2023-12-23 MED ORDER — SODIUM CHLORIDE 0.9% IV SOLUTION: Freq: Once | INTRAVENOUS | Status: AC

## 2023-12-23 MED ORDER — HYDROXYUREA 500 MG PO CAPS: 1000.0000 mg | ORAL_CAPSULE | Freq: Three times a day (TID) | ORAL | Status: DC

## 2023-12-23 MED ORDER — POLYETHYLENE GLYCOL 3350 17 G PO PACK
17.0000 g | PACK | Freq: Every day | ORAL | Status: DC
Start: 2023-12-23 — End: 2023-12-23
  Administered 2023-12-23: 17 g via ORAL
  Filled 2023-12-23: qty 1

## 2023-12-23 MED ORDER — POTASSIUM CHLORIDE CRYS ER 20 MEQ PO TBCR
40.0000 meq | EXTENDED_RELEASE_TABLET | Freq: Once | ORAL | Status: AC
Start: 2023-12-23 — End: 2023-12-23
  Administered 2023-12-23: 40 meq via ORAL
  Filled 2023-12-23: qty 2

## 2023-12-23 NOTE — Discharge Summary (Addendum)
 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  Admitted for severe anemia and transfusion.  Was transfused total 3 units PRBCs with appropriate rise in hemoglobin eventually.  Platelet count dropped below 20 and [name] was given a unit of platelets as per instructions laid out in last oncology note.  [name] has follow-up with her oncologist 9/19, discharged home in good condition.  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 Chandra at [facility], requires intermittent transfusions, was scheduled to receive platelets 9/16. Per most recent documentation 12/16/2023, plan is to transfuse 1 unit of blood if hemoglobin less than 7 and WBC less than 30.  To consider 1 unit PRBC transfusion if WBC greater than 30 and very symptomatic.  Plan to transfuse 1 unit PRBC if hemoglobin less than 5, regardless of WBC. Given no response to initial unit of blood was transfused 2 additional units with appropriate increase.   Thrombocytopenia- Secondary to AML, status post 1 unit platelets 9/14. Per documentation recommendation from her hematologist Dr. Chandra, plan to transfuse platelets if less than 20. 8 lets 18 this morning, given  another unit of platelets.  Has follow-up this Friday.   AML under the care of Dr. Chandra at [facility], currently on palliative therapies including hydroxyurea  2 g 4 times daily, with Levaquin . Hydroxyurea   Levaquin  Patient has outpatient follow-up appointment scheduled on 9/19   Hyponatremia Chronic in nature, appears to be close to baseline.  No further evaluation suggested.   Hypokalemia, replete Atelectasis with hypoxia   Disposition: Home Diet recommendation:  Regular diet DISCHARGE MEDICATION: Allergies as of 12/23/2023       Reactions   Penicillin G Anaphylaxis   Prednisone Anaphylaxis   Pt states it turned her into a monster, lips started swelling and couldn't breath   Codeine Nausea And Vomiting   Heparin  Other (See Comments)   Passed out         Medication List     STOP taking these medications    doxycycline  100 MG capsule Commonly known as: MONODOX        TAKE these medications    albuterol  (2.5 MG/3ML) 0.083% nebulizer solution Commonly known as: PROVENTIL  Inhale 3 mLs (2.5 mg total) into the lungs every 4 (four) hours as needed for wheezing or shortness of breath.   allopurinol  300 MG tablet Commonly known as: Zyloprim  Take 1 tablet (300 mg total) by mouth daily.   ALPRAZolam  0.5 MG tablet Commonly known as: XANAX  Take 0.5 mg by mouth 2 (two) times daily.   ascorbic acid  500 MG tablet Commonly known as: VITAMIN C  Take 500 mg by mouth daily.   b complex vitamins capsule Take 1 capsule by mouth daily.   cholecalciferol  10 MCG (400 UNIT) Tabs tablet Commonly known as: VITAMIN D3 Take 400 Units by mouth daily.   furosemide 40 MG tablet  Commonly known as: LASIX Take 40 mg by mouth daily.   hydroxyurea  500 MG capsule Commonly known as: HYDREA  Take 1,000 mg by mouth in the morning, at noon, and at bedtime.   hydrOXYzine 25 MG tablet Commonly known as: ATARAX Take 25 mg by mouth 3 (three) times daily as needed for anxiety.   levofloxacin   250 MG tablet Commonly known as: LEVAQUIN  Take 250 mg by mouth daily.   lidocaine  5 % Commonly known as: LIDODERM  Place 1 patch onto the skin daily. Remove & Discard patch within 12 hours or as directed by MD   lidocaine -prilocaine  cream Commonly known as: EMLA  Apply 1 Application topically as needed. What changed: reasons to take this   methocarbamol  500 MG tablet Commonly known as: ROBAXIN  Take 1 tablet (500 mg total) by mouth every 8 (eight) hours as needed for muscle spasms.   nicotine  21 mg/24hr patch Commonly known as: NICODERM CQ  - dosed in mg/24 hours Place 1 patch (21 mg total) onto the skin daily.   ondansetron  8 MG disintegrating tablet Commonly known as: ZOFRAN -ODT Take 8 mg by mouth every 8 (eight) hours as needed for nausea or vomiting.   ondansetron  8 MG tablet Commonly known as: ZOFRAN  TAKE ONE TABLET (8 MG TOTAL) BY MOUTH EVERY EIGHT HOURS AS NEEDED FOR NAUSEA OR VOMITING.   pantoprazole  20 MG tablet Commonly known as: PROTONIX  TAKE ONE TABLET (20 MG TOTAL) BY MOUTH DAILY.   polyethylene glycol 17 g packet Commonly known as: MIRALAX  / GLYCOLAX  Take 17 g by mouth daily as needed for mild constipation.   posaconazole  100 MG Tbec delayed-release tablet Commonly known as: NOXAFIL  Take 300 mg by mouth daily.   prochlorperazine  10 MG tablet Commonly known as: COMPAZINE  Take 10 mg by mouth daily as needed for nausea or vomiting.   promethazine  25 MG tablet Commonly known as: PHENERGAN  Take 25 mg by mouth daily as needed for nausea or vomiting.   traMADol  50 MG tablet Commonly known as: ULTRAM  Take 50 mg by mouth every 6 (six) hours as needed for moderate pain (pain score 4-6).        Follow-up Information     Chandra Aloysius BROCKS, MD Follow up.   Specialty: Internal Medicine               Wants to go home   Discharge Exam: Filed Weights   12/21/23 1441  Weight: 68.4 kg   Physical Exam Vitals reviewed.  Constitutional:      General:  [name] is not in acute distress.    Appearance: [name] is not ill-appearing or toxic-appearing.  Cardiovascular:     Rate and Rhythm: Normal rate and regular rhythm.     Heart sounds: No murmur heard. Pulmonary:     Effort: Pulmonary effort is normal. No respiratory distress.     Breath sounds: No wheezing, rhonchi or rales.  Neurological:     Mental Status: [name] is alert.  Psychiatric:        Mood and Affect: Mood normal.        Behavior: Behavior normal.      Condition at discharge: good  The results of significant diagnostics from this hospitalization (including imaging, microbiology, ancillary and laboratory) are listed below for reference.   Imaging Studies: DG Chest 2 View Result Date: 12/21/2023 EXAM: 2 VIEW(S) XRAY OF THE CHEST 12/21/2023 04:52:11 AM COMPARISON: 09/26/2023 CLINICAL HISTORY: Chest pain. Complain of chest pain since Friday, shortness of breath present, progressively worse, feels like heaviness, nausea  present as well. FINDINGS: LINES, TUBES AND DEVICES: Right IJ port catheter in place to distal SVC. LUNGS AND PLEURA: Chronic coarsened interstitial markings identified bilaterally. Bibasilar atelectasis or opacities. Small bilateral pleural effusions. HEART AND MEDIASTINUM: Heart size at upper limits of normal. BONES AND SOFT TISSUES: No acute osseous abnormality. IMPRESSION: 1. Bibasilar atelectasis and small bilateral pleural effusions. Electronically signed by: Waddell Calk MD 12/21/2023 05:38 AM EDT RP Workstation: [id number]    Microbiology: Results for orders placed or performed during the hospital encounter of 12/21/23  Resp panel by RT-PCR (RSV, Flu A&B, Covid) Anterior Nasal Swab     Status: None   Collection Time: 12/21/23  6:31 AM   Specimen: Anterior Nasal Swab  Result Value Ref Range Status   SARS Coronavirus 2 by RT PCR NEGATIVE NEGATIVE Final    Comment: (NOTE) SARS-CoV-2 target nucleic acids are NOT DETECTED.  The SARS-CoV-2 RNA is generally  detectable in upper respiratory specimens during the acute phase of infection. The lowest concentration of SARS-CoV-2 viral copies this assay can detect is 138 copies/mL. A negative result does not preclude SARS-Cov-2 infection and should not be used as the sole basis for treatment or other patient management decisions. A negative result may occur with  improper specimen collection/handling, submission of specimen other than nasopharyngeal swab, presence of viral mutation(s) within the areas targeted by this assay, and inadequate number of viral copies(<138 copies/mL). A negative result must be combined with clinical observations, patient history, and epidemiological information. The expected result is Negative.  Fact Sheet for Patients:  BloggerCourse.com  Fact Sheet for Healthcare Providers:  SeriousBroker.it  This test is no t yet approved or cleared by the [place]  FDA and  has been authorized for detection and/or diagnosis of SARS-CoV-2 by FDA under an Emergency Use Authorization (EUA). This EUA will remain  in effect (meaning this test can be used) for the duration of the COVID-19 declaration under Section 564(b)(1) of the Act, 21 U.S.C.section 360bbb-3(b)(1), unless the authorization is terminated  or revoked sooner.       Influenza A by PCR NEGATIVE NEGATIVE Final   Influenza B by PCR NEGATIVE NEGATIVE Final    Comment: (NOTE) The Xpert Xpress SARS-CoV-2/FLU/RSV plus assay is intended as an aid in the diagnosis of influenza from Nasopharyngeal swab specimens and should not be used as a sole basis for treatment. Nasal washings and aspirates are unacceptable for Xpert Xpress SARS-CoV-2/FLU/RSV testing.  Fact Sheet for Patients: BloggerCourse.com  Fact Sheet for Healthcare Providers: SeriousBroker.it  This test is not yet approved or cleared by the [place]  FDA  and has been authorized for detection and/or diagnosis of SARS-CoV-2 by FDA under an Emergency Use Authorization (EUA). This EUA will remain in effect (meaning this test can be used) for the duration of the COVID-19 declaration under Section 564(b)(1) of the Act, 21 U.S.C. section 360bbb-3(b)(1), unless the authorization is terminated or revoked.     Resp Syncytial Virus by PCR NEGATIVE NEGATIVE Final    Comment: (NOTE) Fact Sheet for Patients: BloggerCourse.com  Fact Sheet for Healthcare Providers: SeriousBroker.it  This test is not yet approved or cleared by the [place]  FDA and has been authorized for detection and/or diagnosis of SARS-CoV-2 by FDA under an Emergency Use Authorization (EUA). This EUA will remain in effect (meaning this test can be used) for the duration of the COVID-19 declaration under Section 564(b)(1) of the Act, 21 U.S.C. section 360bbb-3(b)(1), unless the authorization is terminated or revoked.  Performed at [facility]  [facility], [address] [place], [place] [address]     Labs: CBC: Recent Labs  Lab 12/21/23 0440 12/21/23 1536 12/22/23 0236 12/22/23 1810 12/23/23 0005  WBC 65.2* 60.2* 67.4*  --  67.2*  HGB 4.6* 5.6* 5.4* 8.0* 7.9*  HCT 13.9* 16.7* 16.2* 22.8* 23.2*  MCV 88.5 88.4 89.0  --  87.5  PLT 14* 37* 31*  --  18*   Basic Metabolic Panel: Recent Labs  Lab 12/21/23 0440 12/22/23 0236 12/23/23 0005  NA 126* 128* 129*  K 3.2* 3.4* 3.4*  CL 92* 94* 94*  CO2 22 25 25   GLUCOSE 116* 92 89  BUN 14 12 11   CREATININE 0.65 0.62 0.68  CALCIUM 8.4* 8.4* 8.3*   Liver Function Tests: Recent Labs  Lab 12/21/23 0440  AST 27  ALT 41  ALKPHOS 100  BILITOT 0.4  PROT 5.8*  ALBUMIN 3.3*   CBG: No results for input(s): GLUCAP in the last 168 hours.  Discharge time spent: less than 30 minutes.  Signed: Toribio Door, MD [facility] 12/23/2023

## 2023-12-23 NOTE — Progress Notes (Signed)
 AVS and discharge instructions reviewed w/ patient.

## 2023-12-24 ENCOUNTER — Other Ambulatory Visit: Payer: Self-pay

## 2023-12-24 LAB — CBC
HCT: 23.2 % — ABNORMAL LOW (ref 36.0–46.0)
Hemoglobin: 7.9 g/dL — ABNORMAL LOW (ref 12.0–15.0)
MCH: 29.8 pg (ref 26.0–34.0)
MCHC: 34.1 g/dL (ref 30.0–36.0)
MCV: 87.5 fL (ref 80.0–100.0)
Platelets: 18 K/uL — CL (ref 150–400)
RBC: 2.65 MIL/uL — ABNORMAL LOW (ref 3.87–5.11)
RDW: 16.3 % — ABNORMAL HIGH (ref 11.5–15.5)
WBC: 67.2 K/uL (ref 4.0–10.5)
nRBC: 0.1 % (ref 0.0–0.2)

## 2023-12-24 LAB — BPAM PLATELET PHERESIS
Blood Product Expiration Date: 202509182359
ISSUE DATE / TIME: 202509161146
Unit Type and Rh: 5100

## 2023-12-24 LAB — PREPARE PLATELET PHERESIS: Unit division: 0

## 2024-01-01 ENCOUNTER — Other Ambulatory Visit: Payer: Self-pay

## 2024-01-01 ENCOUNTER — Emergency Department (HOSPITAL_COMMUNITY)

## 2024-01-01 ENCOUNTER — Inpatient Hospital Stay (HOSPITAL_COMMUNITY)
Admission: EM | Admit: 2024-01-01 | Discharge: 2024-01-03 | DRG: 835 | Disposition: A | Discharge status: Home / Self Care | Attending: Family Medicine | Admitting: Family Medicine

## 2024-01-01 DIAGNOSIS — D630 Anemia in neoplastic disease: Secondary | ICD-10-CM | POA: Diagnosis present

## 2024-01-01 DIAGNOSIS — Z880 Allergy status to penicillin: Secondary | ICD-10-CM

## 2024-01-01 DIAGNOSIS — Z886 Allergy status to analgesic agent status: Secondary | ICD-10-CM

## 2024-01-01 DIAGNOSIS — F17210 Nicotine dependence, cigarettes, uncomplicated: Secondary | ICD-10-CM | POA: Diagnosis present

## 2024-01-01 DIAGNOSIS — R110 Nausea: Secondary | ICD-10-CM | POA: Diagnosis not present

## 2024-01-01 DIAGNOSIS — Z8249 Family history of ischemic heart disease and other diseases of the circulatory system: Secondary | ICD-10-CM

## 2024-01-01 DIAGNOSIS — Z87892 Personal history of anaphylaxis: Secondary | ICD-10-CM

## 2024-01-01 DIAGNOSIS — Z888 Allergy status to other drugs, medicaments and biological substances status: Secondary | ICD-10-CM

## 2024-01-01 DIAGNOSIS — K2970 Gastritis, unspecified, without bleeding: Secondary | ICD-10-CM | POA: Diagnosis present

## 2024-01-01 DIAGNOSIS — C9200 Acute myeloblastic leukemia, not having achieved remission: Secondary | ICD-10-CM | POA: Diagnosis not present

## 2024-01-01 DIAGNOSIS — D6959 Other secondary thrombocytopenia: Secondary | ICD-10-CM | POA: Diagnosis present

## 2024-01-01 DIAGNOSIS — R1013 Epigastric pain: Secondary | ICD-10-CM

## 2024-01-01 DIAGNOSIS — D696 Thrombocytopenia, unspecified: Principal | ICD-10-CM | POA: Diagnosis present

## 2024-01-01 DIAGNOSIS — I700 Atherosclerosis of aorta: Secondary | ICD-10-CM | POA: Diagnosis present

## 2024-01-01 DIAGNOSIS — R079 Chest pain, unspecified: Secondary | ICD-10-CM

## 2024-01-01 DIAGNOSIS — E871 Hypo-osmolality and hyponatremia: Secondary | ICD-10-CM | POA: Diagnosis present

## 2024-01-01 LAB — COMPREHENSIVE METABOLIC PANEL WITH GFR
ALT: 58 U/L — ABNORMAL HIGH (ref 0–44)
AST: 46 U/L — ABNORMAL HIGH (ref 15–41)
Albumin: 3.7 g/dL (ref 3.5–5.0)
Alkaline Phosphatase: 190 U/L — ABNORMAL HIGH (ref 38–126)
Anion gap: 10 (ref 5–15)
BUN: 17 mg/dL (ref 8–23)
CO2: 22 mmol/L (ref 22–32)
Calcium: 9 mg/dL (ref 8.9–10.3)
Chloride: 96 mmol/L — ABNORMAL LOW (ref 98–111)
Creatinine, Ser: 0.72 mg/dL (ref 0.44–1.00)
GFR, Estimated: >60 mL/min (ref >60)
Glucose, Bld: 101 mg/dL — ABNORMAL HIGH (ref 70–99)
Potassium: 4.4 mmol/L (ref 3.5–5.1)
Sodium: 128 mmol/L — ABNORMAL LOW (ref 135–145)
Total Bilirubin: 0.5 mg/dL (ref 0.0–1.2)
Total Protein: 6.8 g/dL (ref 6.5–8.1)

## 2024-01-01 LAB — CBC
HCT: 22.5 % — ABNORMAL LOW (ref 36.0–46.0)
Hemoglobin: 7.4 g/dL — ABNORMAL LOW (ref 12.0–15.0)
MCH: 29.4 pg (ref 26.0–34.0)
MCHC: 32.9 g/dL (ref 30.0–36.0)
MCV: 89.3 fL (ref 80.0–100.0)
Platelets: 15 K/uL — CL (ref 150–400)
RBC: 2.52 MIL/uL — ABNORMAL LOW (ref 3.87–5.11)
RDW: 16.2 % — ABNORMAL HIGH (ref 11.5–15.5)
WBC: 72 K/uL (ref 4.0–10.5)
nRBC: 0.1 % (ref 0.0–0.2)

## 2024-01-01 LAB — TROPONIN T, HIGH SENSITIVITY
Troponin T High Sensitivity: <15 ng/L (ref 0–19)
Troponin T High Sensitivity: <15 ng/L (ref 0–19)

## 2024-01-01 LAB — LIPASE, BLOOD: Lipase: 28 U/L (ref 11–51)

## 2024-01-01 MED ORDER — ONDANSETRON HCL 4 MG/2ML IJ SOLN
4.0000 mg | Freq: Once | INTRAMUSCULAR | Status: AC | PRN
Start: 2024-01-01 — End: 2024-01-01
  Administered 2024-01-01: 4 mg via INTRAVENOUS
  Filled 2024-01-01: qty 2

## 2024-01-01 MED ORDER — FENTANYL CITRATE PF 50 MCG/ML IJ SOSY
50.0000 ug | PREFILLED_SYRINGE | Freq: Once | INTRAMUSCULAR | Status: AC
  Administered 2024-01-01: 50 ug via INTRAVENOUS
  Filled 2024-01-01: qty 1

## 2024-01-01 NOTE — ED Provider Notes (Signed)
 [place] EMERGENCY DEPARTMENT AT [facility] Provider Note   CSN: [id number] Arrival date & time: 01/01/24  8077     Patient presents with: Chest Pain   [name] is a 68 y.o. female.  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  Pain is described as sharp, just left of sternum, with radiation into the neck.  She endorses associated shortness of breath, lightheadedness.  Patient also complains of nausea, vomiting, and epigastric abdominal pain.  She says she had her gallbladder removed previously and is concerned based on Internet searches that she could have some sort of pancreatitis.  She states that the abdominal symptoms began approximately 2 days ago.  She drinks nutritional shakes and states that she is able to keep very little of these shakes and very little water down.  She states that she frequently requires both platelet and blood transfusions.  She gets her oncology care through [facility] but has been admitted here as recently as September 15 for symptomatic anemia.  She was having chest pain at that time but states that this evening's chest pain feels different to her.  She denies any known history of blood clot.  {Add pertinent medical, surgical, social history, OB history to HPI:32947}  Chest Pain      Prior to Admission medications   Medication Sig Start Date End Date Taking? Authorizing Provider  ALPRAZolam  (XANAX ) 0.5 MG tablet Take 0.5 mg by mouth 2 (two) times daily.    [provider]  ascorbic acid  (VITAMIN C ) 500 MG tablet Take 500 mg by mouth daily.    [provider]  b complex vitamins capsule Take 1 capsule by mouth daily.    [provider]  cholecalciferol  (VITAMIN D3) 10 MCG (400 UNIT) TABS tablet Take 400 Units by mouth daily.     [provider]  furosemide (LASIX) 40 MG tablet Take 40 mg by mouth daily. 12/19/23 12/26/23  [provider]  hydroxyurea  (HYDREA ) 500 MG capsule Take 1,000 mg by mouth in the morning, at noon, and at bedtime.    [provider]  hydrOXYzine  (ATARAX ) 25 MG tablet Take 25 mg by mouth 3 (three) times daily as needed for anxiety. 06/09/23   [provider]  levofloxacin  (LEVAQUIN ) 250 MG tablet Take 250 mg by mouth daily. Patient not taking: Reported on 12/21/2023    [provider]  lidocaine -prilocaine  (EMLA ) cream Apply 1 Application topically as needed. Patient taking differently: Apply 1 Application topically as needed (port access). 05/20/23   Melanee Annah BROCKS, MD  nicotine  (NICODERM CQ  - DOSED IN MG/24 HOURS) 21 mg/24hr patch Place 1 patch (21 mg total) onto the skin daily. 07/08/23   Swayze, Ava, DO  ondansetron  (ZOFRAN -ODT) 8 MG disintegrating tablet Take 8 mg by mouth every 8 (eight) hours as needed for nausea or vomiting. 12/15/23 12/14/24  [provider]  pantoprazole  (PROTONIX ) 20 MG tablet TAKE ONE TABLET (20 MG TOTAL) BY MOUTH DAILY. 07/23/23   Rao, Archana C, MD  polyethylene glycol (MIRALAX  / GLYCOLAX ) 17 g packet Take 17 g by mouth daily as needed for mild constipation.    [provider]  prochlorperazine  (COMPAZINE ) 10 MG tablet Take 10 mg by mouth daily as needed for nausea or vomiting. 12/16/23   [provider]  promethazine  (PHENERGAN ) 25 MG tablet Take 25 mg  by mouth daily as needed for nausea or vomiting. 12/15/23   [provider]  traMADol  (ULTRAM ) 50 MG tablet Take 50 mg by mouth every 6 (six) hours as needed for moderate pain (pain score 4-6). 09/23/23 09/22/24  [provider]    Allergies: Penicillin g, Prednisone, Codeine, and Heparin     Review of Systems  Cardiovascular:  Positive for chest pain.    Updated Vital Signs BP 110/72 (BP Location: Right Arm)   Pulse 100   Temp 98.1 F (36.7 C)  (Oral)   Resp (!) 21   SpO2 97%   Physical Exam Vitals and nursing note reviewed.  Constitutional:      General: She is not in acute distress.    Appearance: She is well-developed.  HENT:     Head: Normocephalic and atraumatic.  Eyes:     Conjunctiva/sclera: Conjunctivae normal.  Cardiovascular:     Rate and Rhythm: Normal rate and regular rhythm.  Pulmonary:     Effort: Pulmonary effort is normal. No respiratory distress.     Breath sounds: Normal breath sounds.  Chest:     Chest wall: No tenderness.  Abdominal:     Palpations: Abdomen is soft.     Tenderness: There is abdominal tenderness.     Comments: Generalized upper abdominal tenderness  Musculoskeletal:        General: No swelling.     Cervical back: Neck supple.     Right lower leg: No edema.     Left lower leg: No edema.     Comments: Palpable nodule lower left leg on lateral side with no fluctuance, overlying erythema, induration.  Palpable knot on left forearm that patient states is superficial phlebitis from recent IV stick  Skin:    General: Skin is warm and dry.  Neurological:     Mental Status: She is alert.  Psychiatric:        Mood and Affect: Mood normal.     (all labs ordered are listed, but only abnormal results are displayed) Labs Reviewed  COMPREHENSIVE METABOLIC PANEL WITH GFR - Abnormal; Notable for the following components:      Result Value   Sodium 128 (*)    Chloride 96 (*)    Glucose, Bld 101 (*)    AST 46 (*)    ALT 58 (*)    Alkaline Phosphatase 190 (*)    All other components within normal limits  CBC - Abnormal; Notable for the following components:   WBC 72.0 (*)    RBC 2.52 (*)    Hemoglobin 7.4 (*)    HCT 22.5 (*)    RDW 16.2 (*)    Platelets 15 (*)    All other components within normal limits  LIPASE, BLOOD  URINALYSIS, ROUTINE W REFLEX MICROSCOPIC  TROPONIN T, HIGH SENSITIVITY  TROPONIN T, HIGH SENSITIVITY    EKG: None  Radiology: DG Chest 2 View Result Date:  01/01/2024 CLINICAL DATA:  Chest pain. Central chest pain starting today with radiation to the left neck. Shortness of breath, dizziness, lightheadedness, nausea, vomiting, and weakness. EXAM: CHEST - 2 VIEW COMPARISON:  12/21/2023 FINDINGS: Power port type central venous catheter with tip over the cavoatrial junction region. No pneumothorax. Heart size and pulmonary vascularity are normal. Small bilateral pleural effusions with basilar atelectasis are similar to prior study. Mediastinal contours appear intact. Surgical clips in the right upper quadrant. Degenerative changes in the spine. IMPRESSION: Bilateral pleural effusions with basilar atelectasis, similar to prior study. Electronically  Signed   By: Elsie Gravely M.D.   On: 01/01/2024 19:48    {Document cardiac monitor, telemetry assessment procedure when appropriate:32947} Procedures   Medications Ordered in the ED  ondansetron  (ZOFRAN ) injection 4 mg (4 mg Intravenous Given 01/01/24 1946)  fentaNYL  (SUBLIMAZE ) injection 50 mcg (50 mcg Intravenous Given 01/01/24 2236)      {Click here for ABCD2, HEART and other calculators REFRESH Note before signing:1}                              Medical Decision Making Amount and/or Complexity of Data Reviewed Labs: ordered. Radiology: ordered.  Risk Prescription drug management.   This patient presents to the ED for concern of chest pain and abdominal pain, this involves an extensive number of treatment options, and is a complaint that carries with it a high risk of complications and morbidity.  The differential diagnosis includes gastritis, pancreatitis, GERD, appendicitis, PE, symptomatic anemia, thrombocytopenia, others   Co morbidities / Chronic conditions that complicate the patient evaluation  Acute myeloid leukemia, thrombocytopenia   Additional history obtained:  Additional history obtained from EMR External records from outside source obtained and reviewed including oncology  notes   Lab Tests:  I Ordered, and personally interpreted labs.  The pertinent results include: White count of 72,000, platelets of 15,000.  Mildly elevated hepatic enzymes with AST of 46, ALT of 58, alk phos of 190   Imaging Studies ordered:  I ordered imaging studies including CT angio chest PE, CT abdomen pelvis with contrast I independently visualized and interpreted imaging which showed *** I agree with the radiologist interpretation   Cardiac Monitoring: / EKG:  The patient was maintained on a cardiac monitor.  I personally viewed and interpreted the cardiac monitored which showed an underlying rhythm of: Sinus tachycardia   Problem List / ED Course / Critical interventions / Medication management  *** I ordered medication including ***   Reevaluation of the patient after these medicines showed that the patient *** I have reviewed the patients home medicines and have made adjustments as needed   Consultations Obtained:  I requested consultation with the ***,  and discussed lab and imaging findings as well as pertinent plan - they recommend: ***   Social Determinants of Health:  ***   Test / Admission - Considered:  ***   {Document critical care time when appropriate  Document review of labs and clinical decision tools ie CHADS2VASC2, etc  Document your independent review of radiology images and any outside records  Document your discussion with family members, caretakers and with consultants  Document social determinants of health affecting pt's care  Document your decision making why or why not admission, treatments were needed:32947:::1}   Final diagnoses:  None    ED Discharge Orders     None

## 2024-01-01 NOTE — ED Triage Notes (Signed)
 Pt c/o centralized chest pain that started earlier today while at rest. Radiates into the left neck. Associated with SOB, dizziness, lightheadedness, NV, and weakness.

## 2024-01-02 ENCOUNTER — Encounter (HOSPITAL_COMMUNITY): Payer: Self-pay

## 2024-01-02 ENCOUNTER — Emergency Department (HOSPITAL_COMMUNITY)

## 2024-01-02 DIAGNOSIS — K2970 Gastritis, unspecified, without bleeding: Secondary | ICD-10-CM | POA: Diagnosis present

## 2024-01-02 DIAGNOSIS — Z888 Allergy status to other drugs, medicaments and biological substances status: Secondary | ICD-10-CM | POA: Diagnosis not present

## 2024-01-02 DIAGNOSIS — R110 Nausea: Secondary | ICD-10-CM | POA: Diagnosis present

## 2024-01-02 DIAGNOSIS — D696 Thrombocytopenia, unspecified: Secondary | ICD-10-CM | POA: Diagnosis not present

## 2024-01-02 DIAGNOSIS — Z87892 Personal history of anaphylaxis: Secondary | ICD-10-CM | POA: Diagnosis not present

## 2024-01-02 DIAGNOSIS — Z880 Allergy status to penicillin: Secondary | ICD-10-CM | POA: Diagnosis not present

## 2024-01-02 DIAGNOSIS — I700 Atherosclerosis of aorta: Secondary | ICD-10-CM | POA: Diagnosis present

## 2024-01-02 DIAGNOSIS — D6959 Other secondary thrombocytopenia: Secondary | ICD-10-CM | POA: Diagnosis present

## 2024-01-02 DIAGNOSIS — F17210 Nicotine dependence, cigarettes, uncomplicated: Secondary | ICD-10-CM | POA: Diagnosis present

## 2024-01-02 DIAGNOSIS — Z8249 Family history of ischemic heart disease and other diseases of the circulatory system: Secondary | ICD-10-CM | POA: Diagnosis not present

## 2024-01-02 DIAGNOSIS — E871 Hypo-osmolality and hyponatremia: Secondary | ICD-10-CM | POA: Diagnosis present

## 2024-01-02 DIAGNOSIS — Z886 Allergy status to analgesic agent status: Secondary | ICD-10-CM | POA: Diagnosis not present

## 2024-01-02 DIAGNOSIS — C9200 Acute myeloblastic leukemia, not having achieved remission: Secondary | ICD-10-CM | POA: Diagnosis present

## 2024-01-02 DIAGNOSIS — D630 Anemia in neoplastic disease: Secondary | ICD-10-CM | POA: Diagnosis present

## 2024-01-02 LAB — CBC
HCT: 20.4 % — ABNORMAL LOW (ref 36.0–46.0)
HCT: 22.7 % — ABNORMAL LOW (ref 36.0–46.0)
Hemoglobin: 6.6 g/dL — CL (ref 12.0–15.0)
Hemoglobin: 7.6 g/dL — ABNORMAL LOW (ref 12.0–15.0)
MCH: 28.9 pg (ref 26.0–34.0)
MCH: 29.6 pg (ref 26.0–34.0)
MCHC: 32.4 g/dL (ref 30.0–36.0)
MCHC: 33.5 g/dL (ref 30.0–36.0)
MCV: 89.5 fL (ref 80.0–100.0)
MCV: 88.3 fL (ref 80.0–100.0)
Platelets: 10 K/uL — CL (ref 150–400)
Platelets: 28 K/uL — CL (ref 150–400)
RBC: 2.28 MIL/uL — ABNORMAL LOW (ref 3.87–5.11)
RBC: 2.57 MIL/uL — ABNORMAL LOW (ref 3.87–5.11)
RDW: 16.1 % — ABNORMAL HIGH (ref 11.5–15.5)
RDW: 15.5 % (ref 11.5–15.5)
WBC: 57.1 K/uL (ref 4.0–10.5)
WBC: 45.2 K/uL — ABNORMAL HIGH (ref 4.0–10.5)
nRBC: 0 % (ref 0.0–0.2)
nRBC: 0 % (ref 0.0–0.2)

## 2024-01-02 LAB — URINALYSIS, ROUTINE W REFLEX MICROSCOPIC
Bilirubin Urine: NEGATIVE
Glucose, UA: NEGATIVE mg/dL
Ketones, ur: NEGATIVE mg/dL
Leukocytes,Ua: NEGATIVE
Nitrite: NEGATIVE
Protein, ur: NEGATIVE mg/dL
Specific Gravity, Urine: 1.014 (ref 1.005–1.030)
pH: 6 (ref 5.0–8.0)

## 2024-01-02 LAB — COMPREHENSIVE METABOLIC PANEL WITH GFR
ALT: 48 U/L — ABNORMAL HIGH (ref 0–44)
AST: 35 U/L (ref 15–41)
Albumin: 3.4 g/dL — ABNORMAL LOW (ref 3.5–5.0)
Alkaline Phosphatase: 162 U/L — ABNORMAL HIGH (ref 38–126)
Anion gap: 10 (ref 5–15)
BUN: 16 mg/dL (ref 8–23)
CO2: 22 mmol/L (ref 22–32)
Calcium: 8.6 mg/dL — ABNORMAL LOW (ref 8.9–10.3)
Chloride: 96 mmol/L — ABNORMAL LOW (ref 98–111)
Creatinine, Ser: 0.71 mg/dL (ref 0.44–1.00)
GFR, Estimated: >60 mL/min (ref >60)
Glucose, Bld: 96 mg/dL (ref 70–99)
Potassium: 3.8 mmol/L (ref 3.5–5.1)
Sodium: 128 mmol/L — ABNORMAL LOW (ref 135–145)
Total Bilirubin: 0.5 mg/dL (ref 0.0–1.2)
Total Protein: 6.2 g/dL — ABNORMAL LOW (ref 6.5–8.1)

## 2024-01-02 LAB — PREPARE RBC (CROSSMATCH)

## 2024-01-02 LAB — ABO/RH: ABO/RH(D): A POS

## 2024-01-02 MED ORDER — PANTOPRAZOLE SODIUM 40 MG IV SOLR
40.0000 mg | Freq: Two times a day (BID) | INTRAVENOUS | Status: DC
  Administered 2024-01-02 – 2024-01-03 (×4): 40 mg via INTRAVENOUS
  Filled 2024-01-02 (×4): qty 10

## 2024-01-02 MED ORDER — FENTANYL CITRATE PF 50 MCG/ML IJ SOSY: 12.5000 ug | PREFILLED_SYRINGE | INTRAMUSCULAR | Status: DC | PRN

## 2024-01-02 MED ORDER — HYDROXYZINE HCL 25 MG PO TABS: 25.0000 mg | ORAL_TABLET | Freq: Three times a day (TID) | ORAL | Status: DC | PRN

## 2024-01-02 MED ORDER — ONDANSETRON HCL 4 MG/2ML IJ SOLN
4.0000 mg | Freq: Four times a day (QID) | INTRAMUSCULAR | Status: DC | PRN
  Administered 2024-01-03: 4 mg via INTRAVENOUS
  Filled 2024-01-02: qty 2

## 2024-01-02 MED ORDER — TRAZODONE HCL 50 MG PO TABS: 25.0000 mg | ORAL_TABLET | Freq: Every evening | ORAL | Status: DC | PRN

## 2024-01-02 MED ORDER — ONDANSETRON HCL 4 MG PO TABS
4.0000 mg | ORAL_TABLET | Freq: Four times a day (QID) | ORAL | Status: DC | PRN
  Administered 2024-01-03: 4 mg via ORAL
  Filled 2024-01-02: qty 1

## 2024-01-02 MED ORDER — ACETAMINOPHEN 325 MG PO TABS: 650.0000 mg | ORAL_TABLET | Freq: Four times a day (QID) | ORAL | Status: DC | PRN

## 2024-01-02 MED ORDER — IOHEXOL 350 MG/ML SOLN
100.0000 mL | Freq: Once | INTRAVENOUS | Status: AC | PRN
  Administered 2024-01-02: 100 mL via INTRAVENOUS

## 2024-01-02 MED ORDER — SODIUM CHLORIDE 0.9% FLUSH
10.0000 mL | Freq: Two times a day (BID) | INTRAVENOUS | Status: DC
  Administered 2024-01-02 – 2024-01-03 (×3): 10 mL

## 2024-01-02 MED ORDER — SODIUM CHLORIDE 0.9 % IV SOLN: INTRAVENOUS | Status: AC

## 2024-01-02 MED ORDER — SODIUM CHLORIDE 0.9% IV SOLUTION: Freq: Once | INTRAVENOUS | Status: AC

## 2024-01-02 MED ORDER — POLYETHYLENE GLYCOL 3350 17 G PO PACK
17.0000 g | PACK | Freq: Every day | ORAL | Status: DC | PRN
  Administered 2024-01-02 – 2024-01-03 (×2): 17 g via ORAL
  Filled 2024-01-02 (×2): qty 1

## 2024-01-02 MED ORDER — ACETAMINOPHEN 650 MG RE SUPP: 650.0000 mg | Freq: Four times a day (QID) | RECTAL | Status: DC | PRN

## 2024-01-02 MED ORDER — METOCLOPRAMIDE HCL 5 MG/ML IJ SOLN
10.0000 mg | Freq: Once | INTRAMUSCULAR | Status: AC
  Administered 2024-01-02: 10 mg via INTRAVENOUS
  Filled 2024-01-02: qty 2

## 2024-01-02 MED ORDER — SODIUM CHLORIDE 0.9% FLUSH: 10.0000 mL | INTRAVENOUS | Status: DC | PRN

## 2024-01-02 MED ORDER — ALPRAZOLAM 0.5 MG PO TABS
0.5000 mg | ORAL_TABLET | Freq: Two times a day (BID) | ORAL | Status: DC
  Administered 2024-01-02 – 2024-01-03 (×3): 0.5 mg via ORAL
  Filled 2024-01-02 (×3): qty 1

## 2024-01-02 MED ORDER — MORPHINE SULFATE (PF) 4 MG/ML IV SOLN
4.0000 mg | Freq: Once | INTRAVENOUS | Status: AC
  Administered 2024-01-02: 4 mg via INTRAVENOUS
  Filled 2024-01-02: qty 1

## 2024-01-02 MED ORDER — HYDROXYUREA 500 MG PO CAPS: 1000.0000 mg | ORAL_CAPSULE | Freq: Three times a day (TID) | ORAL | Status: DC

## 2024-01-02 MED ORDER — CHLORHEXIDINE GLUCONATE CLOTH 2 % EX PADS
6.0000 | MEDICATED_PAD | Freq: Every day | CUTANEOUS | Status: DC
Start: 2024-01-02 — End: 2024-01-03
  Administered 2024-01-02: 6 via TOPICAL

## 2024-01-02 MED ORDER — SUCRALFATE 1 GM/10ML PO SUSP
1.0000 g | Freq: Three times a day (TID) | ORAL | Status: DC
  Administered 2024-01-02 – 2024-01-03 (×6): 1 g via ORAL
  Filled 2024-01-02 (×6): qty 10

## 2024-01-02 MED ORDER — ALBUTEROL SULFATE (2.5 MG/3ML) 0.083% IN NEBU: 2.5000 mg | INHALATION_SOLUTION | RESPIRATORY_TRACT | Status: DC | PRN

## 2024-01-02 NOTE — Progress Notes (Signed)
 Prior-To-Admission Oral Chemotherapy for Treatment of Oncologic Disease   Order noted from Dr. Zella to continue prior-to-admission oral chemotherapy regimen of hydroxyurea .  Procedure Per Pharmacy & Therapeutics Committee Policy: Orders for continuation of home oral chemotherapy for treatment of an oncologic disease will be held unless approved by an oncologist during current admission.    For patients receiving oncology care at [facility], inpatient pharmacist contacts patient's oncologist during regular office hours to review. If earlier review is medically necessary, attending physician consults [facility] on-call oncologist   For patients receiving oncology care outside of [facility], attending physician consults patient's oncologist to review. If this oncologist or their coverage cannot be reached, attending physician consults [facility] on-call oncologist   Oral chemotherapy continuation order is on hold pending oncologist review, Non-[facility] oncologist / hematolgist Dr Aloysius Code from [facility] provides oncology care and should be consulted by attending physician      Eleanor Agent, PharmD, BCPS 01/02/2024, 8:44 AM

## 2024-01-02 NOTE — Plan of Care (Signed)

## 2024-01-02 NOTE — Plan of Care (Signed)
   Problem: Activity: Goal: Risk for activity intolerance will decrease Outcome: Progressing   Problem: Nutrition: Goal: Adequate nutrition will be maintained Outcome: Progressing   Problem: Coping: Goal: Level of anxiety will decrease Outcome: Progressing

## 2024-01-02 NOTE — H&P (Addendum)
 History and Physical  [name] FMW:[id number] DOB: 10-26-1955 DOA: 01/01/2024  PCP: Melanee Annah BROCKS, MD   Chief Complaint: Weakness, abdominal pain  HPI: [name] is a 67 y.o. 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  She is followed by [facility] oncology, often presents to the [facility] system due to close proximity.  She is maintained on high-dose hydroxyurea  and has required multiple hospital admissions for blood and platelet transfusion.  Most recently, she was discharged from this facility on 9/16 after receiving a total of 3 units of PRBCs as well as 1 unit of platelets.  Patient denies any bleeding, cough, dizziness, syncope, injury, fever, chills or other concerns.  In the last 24 hours, she had recurrence of her chest and abdominal discomfort, as well as weakness.  On evaluation in the ER overnight, she was noted to have hemoglobin of 7.4, platelet count of 15,000.  Platelets were ordered, and labs early this morning revealed hemoglobin of 6.6 and so 1 unit PRBC has been ordered as well.  Of note patient also states that she has abdominal fullness and burning after meals, which she has not experienced in the past.  Review of Systems: Please see HPI for pertinent positives and negatives. A complete 10 system review of systems are otherwise negative.  Past Medical History:  Diagnosis Date   GERD (gastroesophageal reflux disease)    Leukemia (HCC)    Past Surgical History:  Procedure Laterality Date   CARDIAC SURGERY     CHOLECYSTECTOMY     HIP ARTHROSCOPY     IR IMAGING GUIDED PORT INSERTION  05/19/2023   surgery on left foot Left    Social History:  reports that she has been smoking cigarettes. She has a 1 pack-year smoking history. She has never used smokeless  tobacco. She reports that she does not currently use alcohol. She reports that she does not currently use drugs.  Allergies  Allergen Reactions   Penicillin G Anaphylaxis   Prednisone Anaphylaxis    Pt states it turned her into a monster, lips started swelling and couldn't breath   Codeine Nausea And Vomiting   Heparin  Other (See Comments)    Passed out     Family History  Problem Relation Age of Onset   Cancer Mother    Heart failure Father    Heart failure Sister    Cancer Maternal Grandmother    Cancer Maternal Grandfather      Prior to Admission medications   Medication Sig Start Date End Date Taking? Authorizing Provider  ALPRAZolam  (XANAX ) 0.5 MG tablet Take 0.5 mg by mouth 2 (two) times daily.    [provider]  ascorbic acid  (VITAMIN C ) 500 MG tablet Take 500 mg by mouth daily.    [provider]  b complex vitamins capsule Take 1 capsule by mouth daily.    [provider]  cholecalciferol  (VITAMIN D3) 10 MCG (400 UNIT) TABS tablet Take 400 Units by mouth daily.    [provider]  furosemide (LASIX) 40 MG tablet Take 40 mg by mouth daily. 12/19/23 12/26/23  [provider]  hydroxyurea  (HYDREA ) 500 MG capsule Take 1,000 mg by mouth in the morning, at noon, and at bedtime.    [provider]  hydrOXYzine  (ATARAX ) 25 MG tablet Take 25  mg by mouth 3 (three) times daily as needed for anxiety. 06/09/23   [provider]  nicotine  (NICODERM CQ  - DOSED IN MG/24 HOURS) 21 mg/24hr patch Place 1 patch (21 mg total) onto the skin daily. 07/08/23   Swayze, Ava, DO  ondansetron  (ZOFRAN -ODT) 8 MG disintegrating tablet Take 8 mg by mouth every 8 (eight) hours as needed for nausea or vomiting. 12/15/23 12/14/24  [provider]  pantoprazole  (PROTONIX ) 20 MG tablet TAKE ONE TABLET (20 MG TOTAL) BY MOUTH DAILY. 07/23/23   Rao, Archana C, MD  polyethylene glycol (MIRALAX  / GLYCOLAX ) 17 g packet Take 17 g by mouth daily as needed  for mild constipation.    [provider]  prochlorperazine  (COMPAZINE ) 10 MG tablet Take 10 mg by mouth daily as needed for nausea or vomiting. 12/16/23   [provider]  promethazine  (PHENERGAN ) 25 MG tablet Take 25 mg by mouth daily as needed for nausea or vomiting. 12/15/23   [provider]  traMADol  (ULTRAM ) 50 MG tablet Take 50 mg by mouth every 6 (six) hours as needed for moderate pain (pain score 4-6). 09/23/23 09/22/24  [provider]    Physical Exam: BP (!) 123/53   Pulse 90   Temp 97.7 F (36.5 C) (Oral)   Resp 17   Ht 5' 5 (1.651 m)   Wt 66.8 kg   SpO2 95%   BMI 24.51 kg/m  General:  Alert, oriented, calm, in no acute distress, looks slightly anxious Cardiovascular: RRR, no murmurs or rubs, no peripheral edema  Respiratory: clear to auscultation bilaterally, no wheezes, no crackles  Abdomen: soft, nontender, nondistended, normal bowel tones heard  Skin: dry, no rashes  Musculoskeletal: no joint effusions, normal range of motion  Psychiatric: appropriate affect, normal speech  Neurologic: extraocular muscles intact, clear speech, moving all extremities with intact sensorium         Labs on Admission:  Basic Metabolic Panel: Recent Labs  Lab 01/01/24 1932 01/02/24 0430  NA 128* 128*  K 4.4 3.8  CL 96* 96*  CO2 22 22  GLUCOSE 101* 96  BUN 17 16  CREATININE 0.72 0.71  CALCIUM 9.0 8.6*   Liver Function Tests: Recent Labs  Lab 01/01/24 1932 01/02/24 0430  AST 46* 35  ALT 58* 48*  ALKPHOS 190* 162*  BILITOT 0.5 0.5  PROT 6.8 6.2*  ALBUMIN 3.7 3.4*   Recent Labs  Lab 01/01/24 1932  LIPASE 28   No results for input(s): AMMONIA in the last 168 hours. CBC: Recent Labs  Lab 01/01/24 1932 01/02/24 0430  WBC 72.0* 57.1*  HGB 7.4* 6.6*  HCT 22.5* 20.4*  MCV 89.3 89.5  PLT 15* 10*   Cardiac Enzymes: No results for input(s): CKTOTAL, CKMB, CKMBINDEX, TROPONINI in the last 168 hours. BNP (last 3  results) No results for input(s): BNP in the last 8760 hours.  ProBNP (last 3 results) No results for input(s): PROBNP in the last 8760 hours.  CBG: No results for input(s): GLUCAP in the last 168 hours.  Radiological Exams on Admission: CT Angio Chest PE W and/or Wo Contrast Result Date: 01/02/2024 CLINICAL DATA:  Chest pain radiating to the left neck. Shortness of breath, dizziness, lightheadedness, nausea/vomiting, and weakness. History of leukemia. EXAM: CT ANGIOGRAPHY CHEST CT ABDOMEN AND PELVIS WITH CONTRAST TECHNIQUE: Multidetector CT imaging of the chest was performed using the standard protocol during bolus administration of intravenous contrast. Multiplanar CT image reconstructions and MIPs were obtained to evaluate the vascular anatomy.  Multidetector CT imaging of the abdomen and pelvis was performed using the standard protocol during bolus administration of intravenous contrast. RADIATION DOSE REDUCTION: This exam was performed according to the departmental dose-optimization program which includes automated exposure control, adjustment of the mA and/or kV according to patient size and/or use of iterative reconstruction technique. CONTRAST:  OMNIPAQUE  IOHEXOL  350 MG/ML SOLN COMPARISON:  CTA chest dated 09/24/2023 and CT abdomen/pelvis dated 05/17/2023 FINDINGS: CTA CHEST FINDINGS Cardiovascular: Satisfactory opacification of the pulmonary arteries to the segmental level. No evidence of pulmonary embolism. Normal heart size. No pericardial effusion. Mild thoracic aortic atherosclerosis. Mediastinum/Nodes: Mediastinal and bilateral perihilar lymphadenopathy progressive, including: --dominant 2.2 cm subcarinal node (image 87), progressive --19 mm short axis right Perihilar Node --11 mm short axis left axillary node, progressive Right chest port terminates at the cavoatrial junction. Lungs/Pleura: Moderate right pleural effusion, grossly unchanged, with associated compressive  atelectasis in the right lower lobe. Small left pleural effusion, new, with associated dependent atelectasis. No pneumothorax. Musculoskeletal: Mild degenerative changes of the thoracic spine. Review of the MIP images confirms the above findings. CT ABDOMEN and PELVIS FINDINGS Hepatobiliary: No focal liver abnormality is seen. Status post cholecystectomy. Mild central intrahepatic and extrahepatic ductal prominence, postsurgical. Pancreas: Unremarkable. No pancreatic ductal dilatation or surrounding inflammatory changes. Spleen: Moderate splenomegaly, measuring 17.0 cm in maximal craniocaudal dimension, progressive. Adrenals/Urinary Tract: Adrenal glands are unremarkable. Kidneys are normal, without renal calculi, focal lesion, or hydronephrosis. Bladder is unremarkable. Stomach/Bowel: Stomach is within normal limits. Appendix appears normal (image 61). No evidence of bowel wall thickening, distention, or inflammatory changes. Vascular/Lymphatic: Atherosclerotic calcifications of the abdominal aorta and branch vessels, although patent. Progressive abdominopelvic lymphadenopathy, including: --15 mm short axis portacaval node (image 30) --13 mm short axis jejunal mesenteric node (image 45) --9 mm short axis left perirectal node (image 70) Reproductive: Calcified uterine fibroids.  No adnexal masses. Other: No abdominopelvic ascites Musculoskeletal: Mild degenerative changes of the lumbar spine. Review of the MIP images confirms the above findings. IMPRESSION: No evidence of pulmonary embolism. Progressive thoracic and abdominopelvic lymphadenopathy, as above, compatible with the patient's known leukemia. Moderate splenomegaly, progressive. Moderate right pleural effusion, grossly unchanged. Small left pleural effusion, new. Aortic Atherosclerosis (ICD10-I70.0). Electronically Signed   By: Pinkie Pebbles M.D.   On: 01/02/2024 01:45   CT ABDOMEN PELVIS W CONTRAST Result Date: 01/02/2024 CLINICAL DATA:  Chest pain  radiating to the left neck. Shortness of breath, dizziness, lightheadedness, nausea/vomiting, and weakness. History of leukemia. EXAM: CT ANGIOGRAPHY CHEST CT ABDOMEN AND PELVIS WITH CONTRAST TECHNIQUE: Multidetector CT imaging of the chest was performed using the standard protocol during bolus administration of intravenous contrast. Multiplanar CT image reconstructions and MIPs were obtained to evaluate the vascular anatomy. Multidetector CT imaging of the abdomen and pelvis was performed using the standard protocol during bolus administration of intravenous contrast. RADIATION DOSE REDUCTION: This exam was performed according to the departmental dose-optimization program which includes automated exposure control, adjustment of the mA and/or kV according to patient size and/or use of iterative reconstruction technique. CONTRAST:  OMNIPAQUE  IOHEXOL  350 MG/ML SOLN COMPARISON:  CTA chest dated 09/24/2023 and CT abdomen/pelvis dated 05/17/2023 FINDINGS: CTA CHEST FINDINGS Cardiovascular: Satisfactory opacification of the pulmonary arteries to the segmental level. No evidence of pulmonary embolism. Normal heart size. No pericardial effusion. Mild thoracic aortic atherosclerosis. Mediastinum/Nodes: Mediastinal and bilateral perihilar lymphadenopathy progressive, including: --dominant 2.2 cm subcarinal node (image 87), progressive --19 mm short axis right Perihilar Node --11 mm short axis left axillary node, progressive  Right chest port terminates at the cavoatrial junction. Lungs/Pleura: Moderate right pleural effusion, grossly unchanged, with associated compressive atelectasis in the right lower lobe. Small left pleural effusion, new, with associated dependent atelectasis. No pneumothorax. Musculoskeletal: Mild degenerative changes of the thoracic spine. Review of the MIP images confirms the above findings. CT ABDOMEN and PELVIS FINDINGS Hepatobiliary: No focal liver abnormality is seen. Status post  cholecystectomy. Mild central intrahepatic and extrahepatic ductal prominence, postsurgical. Pancreas: Unremarkable. No pancreatic ductal dilatation or surrounding inflammatory changes. Spleen: Moderate splenomegaly, measuring 17.0 cm in maximal craniocaudal dimension, progressive. Adrenals/Urinary Tract: Adrenal glands are unremarkable. Kidneys are normal, without renal calculi, focal lesion, or hydronephrosis. Bladder is unremarkable. Stomach/Bowel: Stomach is within normal limits. Appendix appears normal (image 61). No evidence of bowel wall thickening, distention, or inflammatory changes. Vascular/Lymphatic: Atherosclerotic calcifications of the abdominal aorta and branch vessels, although patent. Progressive abdominopelvic lymphadenopathy, including: --15 mm short axis portacaval node (image 30) --13 mm short axis jejunal mesenteric node (image 45) --9 mm short axis left perirectal node (image 70) Reproductive: Calcified uterine fibroids.  No adnexal masses. Other: No abdominopelvic ascites Musculoskeletal: Mild degenerative changes of the lumbar spine. Review of the MIP images confirms the above findings. IMPRESSION: No evidence of pulmonary embolism. Progressive thoracic and abdominopelvic lymphadenopathy, as above, compatible with the patient's known leukemia. Moderate splenomegaly, progressive. Moderate right pleural effusion, grossly unchanged. Small left pleural effusion, new. Aortic Atherosclerosis (ICD10-I70.0). Electronically Signed   By: Pinkie Pebbles M.D.   On: 01/02/2024 01:45   DG Chest 2 View Result Date: 01/01/2024 CLINICAL DATA:  Chest pain. Central chest pain starting today with radiation to the left neck. Shortness of breath, dizziness, lightheadedness, nausea, vomiting, and weakness. EXAM: CHEST - 2 VIEW COMPARISON:  12/21/2023 FINDINGS: Power port type central venous catheter with tip over the cavoatrial junction region. No pneumothorax. Heart size and pulmonary vascularity are  normal. Small bilateral pleural effusions with basilar atelectasis are similar to prior study. Mediastinal contours appear intact. Surgical clips in the right upper quadrant. Degenerative changes in the spine. IMPRESSION: Bilateral pleural effusions with basilar atelectasis, similar to prior study. Electronically Signed   By: Elsie Gravely M.D.   On: 01/01/2024 19:48   Assessment/Plan [name] is a 68 y.o. 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  Recurrent anemia and thrombocytopenia-due to her AML, no evidence of bleeding.  She most recently received platelet transfusion on 9/19. She is followed by Dr. Chandra at [facility].  Per his most recent documentation 12/16/2023, plan is to transfuse 1 unit of blood if hemoglobin less than 7 and WBC less than 30.  To consider 1 unit PRBC transfusion if WBC greater than 30 and very symptomatic.  Plan to transfuse 1 unit PRBC if hemoglobin less than 5, regardless of WBC.  Regarding thrombocytopenia, plan is to transfuse platelets of less than 20. -Inpatient admission -Patient is status post 1 unit platelet transfusion this morning, await posttransfusion CBC -1 unit PRBC transfusion has been ordered as well  AML-continue high dose hydroxyurea  3 times daily  Gastritis-patient has abdominal discomfort and burning after meals, states that bowel movements are normal, she denies any nausea and vomiting, or food regurgitation.  Denies any dysphagia. 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 Inpatient status is judged to be reasonable and necessary in order to provide the required intensity of service to ensure the patient's safety. The patient's presenting symptoms, physical exam findings, and initial radiographic and laboratory data in the context of their chronic comorbidities is felt to place them at high risk for further clinical deterioration. Furthermore, it is not anticipated that the patient will be medically stable for discharge from the hospital within 2 midnights of admission.    I certify that at the point of admission it is my clinical judgment that the patient will require inpatient hospital care spanning beyond 2 midnights from the point of admission due to high intensity of service, high risk for further deterioration and high frequency of surveillance required  Time spent: 53 minutes  Zamauri Nez CHRISTELLA Gail MD [facility] Pager [phone]  If 7PM-7AM, please contact night-coverage [email or web address] Password [email or web address]  01/02/2024, 7:39 AM

## 2024-01-02 NOTE — Plan of Care (Signed)

## 2024-01-03 ENCOUNTER — Encounter (HOSPITAL_COMMUNITY): Payer: Self-pay | Admitting: Internal Medicine

## 2024-01-03 DIAGNOSIS — D696 Thrombocytopenia, unspecified: Secondary | ICD-10-CM | POA: Diagnosis not present

## 2024-01-03 LAB — BASIC METABOLIC PANEL WITH GFR
Anion gap: 11 (ref 5–15)
BUN: 10 mg/dL (ref 8–23)
CO2: 22 mmol/L (ref 22–32)
Calcium: 8.6 mg/dL — ABNORMAL LOW (ref 8.9–10.3)
Chloride: 98 mmol/L (ref 98–111)
Creatinine, Ser: 0.64 mg/dL (ref 0.44–1.00)
GFR, Estimated: >60 mL/min (ref >60)
Glucose, Bld: 102 mg/dL — ABNORMAL HIGH (ref 70–99)
Potassium: 3.6 mmol/L (ref 3.5–5.1)
Sodium: 130 mmol/L — ABNORMAL LOW (ref 135–145)

## 2024-01-03 LAB — CBC
HCT: 24.3 % — ABNORMAL LOW (ref 36.0–46.0)
HCT: 22.8 % — ABNORMAL LOW (ref 36.0–46.0)
Hemoglobin: 8.3 g/dL — ABNORMAL LOW (ref 12.0–15.0)
Hemoglobin: 7.7 g/dL — ABNORMAL LOW (ref 12.0–15.0)
MCH: 30 pg (ref 26.0–34.0)
MCH: 29.7 pg (ref 26.0–34.0)
MCHC: 34.2 g/dL (ref 30.0–36.0)
MCHC: 33.8 g/dL (ref 30.0–36.0)
MCV: 87.7 fL (ref 80.0–100.0)
MCV: 88 fL (ref 80.0–100.0)
Platelets: 24 K/uL — CL (ref 150–400)
Platelets: 21 K/uL — CL (ref 150–400)
RBC: 2.77 MIL/uL — ABNORMAL LOW (ref 3.87–5.11)
RBC: 2.59 MIL/uL — ABNORMAL LOW (ref 3.87–5.11)
RDW: 15.8 % — ABNORMAL HIGH (ref 11.5–15.5)
RDW: 15.8 % — ABNORMAL HIGH (ref 11.5–15.5)
WBC: 54.7 K/uL (ref 4.0–10.5)
WBC: 52.7 K/uL (ref 4.0–10.5)
nRBC: 0 % (ref 0.0–0.2)
nRBC: 0.1 % (ref 0.0–0.2)

## 2024-01-03 MED ORDER — PROCHLORPERAZINE EDISYLATE 10 MG/2ML IJ SOLN
10.0000 mg | Freq: Four times a day (QID) | INTRAMUSCULAR | Status: DC | PRN
  Administered 2024-01-03: 10 mg via INTRAVENOUS
  Filled 2024-01-03: qty 2

## 2024-01-03 NOTE — Progress Notes (Signed)
 Date and time results received: 01/03/24 0435  Test: Platelets, WBC's  Critical Value: Plts 24, WBC's 54.7  Name of Provider Notified: Lavanda Horns, NP @ [phone]

## 2024-01-03 NOTE — TOC Initial Note (Signed)
 Transition of Care [facility]) - Initial/Assessment Note    Patient Details  Name: [name] MRN: [id number] Date of Birth: 01/21/1956  Transition of Care [facility] CM/SW Contact:    Sonda Manuella Quill, RN Phone Number: 01/03/2024, 2:48 PM  Clinical Narrative:                 [place] w/ pt in room; pt said she lives at home; she plans to return at d/c; pt identified POC granddaughter Reynolds Loftis [phone]); she will provide transportation; pt verified insurance/PCP; she denied SDOH risks; pt has BSC; she does not have HH services or home oxygen; no IPCM needs.  Expected Discharge Plan: Home/Self Care Barriers to Discharge: No Barriers Identified   Patient Goals and CMS Choice Patient states their goals for this hospitalization and ongoing recovery are:: home CMS Medicare.gov Compare Post Acute Care list provided to:: Patient        Expected Discharge Plan and Services   Discharge Planning Services: CM Consult   Living arrangements for the past 2 months: Single Family Home Expected Discharge Date: 01/03/24               DME Arranged: N/A DME Agency: NA       HH Arranged: NA HH Agency: NA        Prior Living Arrangements/Services Living arrangements for the past 2 months: Single Family Home Lives with:: Self Patient language and need for interpreter reviewed:: Yes Do you feel safe going back to the place where you live?: Yes      Need for Family Participation in Patient Care: Yes (Comment) Care giver support system in place?: Yes (comment) Current home services: DME (BSC) Criminal Activity/Legal Involvement Pertinent to Current Situation/Hospitalization: No - Comment as needed  Activities of Daily Living   ADL Screening (condition at time of admission) Independently performs ADLs?: Yes (appropriate for developmental age) Is the patient deaf or have difficulty hearing?: No Does the patient have difficulty seeing, even when wearing glasses/contacts?: No Does  the patient have difficulty concentrating, remembering, or making decisions?: No  Permission Sought/Granted Permission sought to share information with : Case Manager Permission granted to share information with : Yes, Verbal Permission Granted  Share Information with NAME: Case Manager     Permission granted to share info w Relationship: Reynolds Jain (granddaughter) [phone]     Emotional Assessment Appearance:: Appears stated age Attitude/Demeanor/Rapport: Gracious Affect (typically observed): Accepting Orientation: : Oriented to Self, Oriented to Place, Oriented to  Time, Oriented to Situation Alcohol / Substance Use: Not Applicable Psych Involvement: No (comment)  Admission diagnosis:  Nausea [R11.0] Epigastric pain [R10.13] Thrombocytopenia [D69.6] Acute myeloid leukemia not having achieved remission (HCC) [C92.00] Chest pain, unspecified type [R07.9] Patient Active Problem List   Diagnosis Date Noted   Acute leukemia (HCC) 12/21/2023   Pleural effusion on right 09/24/2023   Thrombocytopenia 09/24/2023   Chronic hyponatremia 09/24/2023   Colitis 07/05/2023   Multifocal pneumonia 07/04/2023   Pancytopenia (HCC) 07/04/2023   Gout 07/04/2023   Acute myeloid leukemia not having achieved remission (HCC) 03/10/2023   Anemia 03/04/2023   Aortic atherosclerosis 03/04/2023   PCP:  Melanee Annah BROCKS, MD Pharmacy:   [facility] [phone] [address], [place] - [address] [place] [address] Phone: [phone] Fax: [phone]  [facility] - [place], [place] - [address] [place] [address] Phone: [phone] Fax: [phone]     Social Drivers of Health (SDOH) Social History: SDOH Screenings  Food Insecurity: No Food Insecurity (01/03/2024)  Housing: Low Risk  (01/03/2024)  Transportation Needs: No Transportation Needs (01/03/2024)  Utilities: Not At Risk (01/03/2024)  Alcohol Screen: Low Risk   (03/22/2021)  Depression (PHQ2-9): Low Risk  (05/06/2023)  Financial Resource Strain: Low Risk  (06/05/2023)   Received from [facility]  Physical Activity: Insufficiently Active (03/22/2021)  Social Connections: Socially Isolated (01/02/2024)  Stress: No Stress Concern Present (03/22/2021)  Tobacco Use: High Risk (01/03/2024)  Health Literacy: Low Risk  (03/13/2023)   Received from [facility]   SDOH Interventions: Food Insecurity Interventions: Intervention Not Indicated, Inpatient TOC Housing Interventions: Intervention Not Indicated, Inpatient TOC Transportation Interventions: Intervention Not Indicated, Inpatient TOC Utilities Interventions: Intervention Not Indicated, Inpatient TOC   Readmission Risk Interventions    01/03/2024    2:46 PM 12/22/2023    8:33 AM 09/25/2023   10:36 AM  Readmission Risk Prevention Plan  Transportation Screening Complete Complete Complete  PCP or Specialist Appt within 3-5 Days Complete  Complete  HRI or Home Care Consult Complete Complete Complete  Social Work Consult for Recovery Care Planning/Counseling Complete Complete Complete  Palliative Care Screening Not Applicable Not Applicable Complete  Medication Review Oceanographer) Complete Complete Complete

## 2024-01-03 NOTE — Discharge Summary (Signed)
 Physician Discharge Summary   Patient: [name] MRN: [id number] DOB: 1956-03-24  Admit date:     01/01/2024  Discharge date: {dischdate:26783}  Discharge Physician: Elgin Lam   PCP: Melanee Annah BROCKS, MD   Recommendations at discharge:  ***  Discharge Diagnoses: Principal Problem:   Thrombocytopenia  Resolved Problems:   * No resolved hospital problems. [facility] Course: No notes on file  Assessment and Plan: No notes have been filed under this hospital service. Service: Hospitalist   Aortic atherosclerosis ***   {Tip this will not be part of the note when signed Body mass index is 24.51 kg/m. , ,  (Optional):26781}  {(NOTE) Pain control PDMP Statment (Optional):26782} Consultants: *** Procedures performed: ***  Disposition: {Plan; Disposition:26390} Diet recommendation:  {Diet_Plan:26776} DISCHARGE MEDICATION: Allergies as of 01/03/2024       Reactions   Penicillin G Anaphylaxis   Prednisone Anaphylaxis   Pt states it turned her into a monster, lips started swelling and couldn't breath   Codeine Nausea And Vomiting   Heparin  Other (See Comments)   Passed out      Med Rec must be completed prior to using this [facility]***       Discharge Exam: Filed Weights   01/02/24 0414  Weight: 66.8 kg   ***  Condition at discharge: {DC Condition:26389}  The results of significant diagnostics from this hospitalization (including imaging, microbiology, ancillary and laboratory) are listed below for reference.   Imaging Studies: CT Angio Chest PE W and/or Wo Contrast Result Date: 01/02/2024 CLINICAL DATA:  Chest pain radiating to the left neck. Shortness of breath, dizziness, lightheadedness, nausea/vomiting, and weakness. History of leukemia. EXAM: CT ANGIOGRAPHY CHEST CT ABDOMEN AND PELVIS WITH CONTRAST TECHNIQUE: Multidetector CT imaging of the chest was performed using the standard protocol during bolus administration of intravenous contrast.  Multiplanar CT image reconstructions and MIPs were obtained to evaluate the vascular anatomy. Multidetector CT imaging of the abdomen and pelvis was performed using the standard protocol during bolus administration of intravenous contrast. RADIATION DOSE REDUCTION: This exam was performed according to the departmental dose-optimization program which includes automated exposure control, adjustment of the mA and/or kV according to patient size and/or use of iterative reconstruction technique. CONTRAST:  OMNIPAQUE  IOHEXOL  350 MG/ML SOLN COMPARISON:  CTA chest dated 09/24/2023 and CT abdomen/pelvis dated 05/17/2023 FINDINGS: CTA CHEST FINDINGS Cardiovascular: Satisfactory opacification of the pulmonary arteries to the segmental level. No evidence of pulmonary embolism. Normal heart size. No pericardial effusion. Mild thoracic aortic atherosclerosis. Mediastinum/Nodes: Mediastinal and bilateral perihilar lymphadenopathy progressive, including: --dominant 2.2 cm subcarinal node (image 87), progressive --19 mm short axis right Perihilar Node --11 mm short axis left axillary node, progressive Right chest port terminates at the cavoatrial junction. Lungs/Pleura: Moderate right pleural effusion, grossly unchanged, with associated compressive atelectasis in the right lower lobe. Small left pleural effusion, new, with associated dependent atelectasis. No pneumothorax. Musculoskeletal: Mild degenerative changes of the thoracic spine. Review of the MIP images confirms the above findings. CT ABDOMEN and PELVIS FINDINGS Hepatobiliary: No focal liver abnormality is seen. Status post cholecystectomy. Mild central intrahepatic and extrahepatic ductal prominence, postsurgical. Pancreas: Unremarkable. No pancreatic ductal dilatation or surrounding inflammatory changes. Spleen: Moderate splenomegaly, measuring 17.0 cm in maximal craniocaudal dimension, progressive. Adrenals/Urinary Tract: Adrenal glands are unremarkable. Kidneys  are normal, without renal calculi, focal lesion, or hydronephrosis. Bladder is unremarkable. Stomach/Bowel: Stomach is within normal limits. Appendix appears normal (image 61). No evidence of bowel wall thickening, distention, or inflammatory changes. Vascular/Lymphatic:  Atherosclerotic calcifications of the abdominal aorta and branch vessels, although patent. Progressive abdominopelvic lymphadenopathy, including: --15 mm short axis portacaval node (image 30) --13 mm short axis jejunal mesenteric node (image 45) --9 mm short axis left perirectal node (image 70) Reproductive: Calcified uterine fibroids.  No adnexal masses. Other: No abdominopelvic ascites Musculoskeletal: Mild degenerative changes of the lumbar spine. Review of the MIP images confirms the above findings. IMPRESSION: No evidence of pulmonary embolism. Progressive thoracic and abdominopelvic lymphadenopathy, as above, compatible with the patient's known leukemia. Moderate splenomegaly, progressive. Moderate right pleural effusion, grossly unchanged. Small left pleural effusion, new. Aortic Atherosclerosis (ICD10-I70.0). Electronically Signed   By: Pinkie Pebbles M.D.   On: 01/02/2024 01:45   CT ABDOMEN PELVIS W CONTRAST Result Date: 01/02/2024 CLINICAL DATA:  Chest pain radiating to the left neck. Shortness of breath, dizziness, lightheadedness, nausea/vomiting, and weakness. History of leukemia. EXAM: CT ANGIOGRAPHY CHEST CT ABDOMEN AND PELVIS WITH CONTRAST TECHNIQUE: Multidetector CT imaging of the chest was performed using the standard protocol during bolus administration of intravenous contrast. Multiplanar CT image reconstructions and MIPs were obtained to evaluate the vascular anatomy. Multidetector CT imaging of the abdomen and pelvis was performed using the standard protocol during bolus administration of intravenous contrast. RADIATION DOSE REDUCTION: This exam was performed according to the departmental dose-optimization program which  includes automated exposure control, adjustment of the mA and/or kV according to patient size and/or use of iterative reconstruction technique. CONTRAST:  OMNIPAQUE  IOHEXOL  350 MG/ML SOLN COMPARISON:  CTA chest dated 09/24/2023 and CT abdomen/pelvis dated 05/17/2023 FINDINGS: CTA CHEST FINDINGS Cardiovascular: Satisfactory opacification of the pulmonary arteries to the segmental level. No evidence of pulmonary embolism. Normal heart size. No pericardial effusion. Mild thoracic aortic atherosclerosis. Mediastinum/Nodes: Mediastinal and bilateral perihilar lymphadenopathy progressive, including: --dominant 2.2 cm subcarinal node (image 87), progressive --19 mm short axis right Perihilar Node --11 mm short axis left axillary node, progressive Right chest port terminates at the cavoatrial junction. Lungs/Pleura: Moderate right pleural effusion, grossly unchanged, with associated compressive atelectasis in the right lower lobe. Small left pleural effusion, new, with associated dependent atelectasis. No pneumothorax. Musculoskeletal: Mild degenerative changes of the thoracic spine. Review of the MIP images confirms the above findings. CT ABDOMEN and PELVIS FINDINGS Hepatobiliary: No focal liver abnormality is seen. Status post cholecystectomy. Mild central intrahepatic and extrahepatic ductal prominence, postsurgical. Pancreas: Unremarkable. No pancreatic ductal dilatation or surrounding inflammatory changes. Spleen: Moderate splenomegaly, measuring 17.0 cm in maximal craniocaudal dimension, progressive. Adrenals/Urinary Tract: Adrenal glands are unremarkable. Kidneys are normal, without renal calculi, focal lesion, or hydronephrosis. Bladder is unremarkable. Stomach/Bowel: Stomach is within normal limits. Appendix appears normal (image 61). No evidence of bowel wall thickening, distention, or inflammatory changes. Vascular/Lymphatic: Atherosclerotic calcifications of the abdominal aorta and branch vessels, although  patent. Progressive abdominopelvic lymphadenopathy, including: --15 mm short axis portacaval node (image 30) --13 mm short axis jejunal mesenteric node (image 45) --9 mm short axis left perirectal node (image 70) Reproductive: Calcified uterine fibroids.  No adnexal masses. Other: No abdominopelvic ascites Musculoskeletal: Mild degenerative changes of the lumbar spine. Review of the MIP images confirms the above findings. IMPRESSION: No evidence of pulmonary embolism. Progressive thoracic and abdominopelvic lymphadenopathy, as above, compatible with the patient's known leukemia. Moderate splenomegaly, progressive. Moderate right pleural effusion, grossly unchanged. Small left pleural effusion, new. Aortic Atherosclerosis (ICD10-I70.0). Electronically Signed   By: Pinkie Pebbles M.D.   On: 01/02/2024 01:45   DG Chest 2 View Result Date: 01/01/2024 CLINICAL DATA:  Chest pain.  Central chest pain starting today with radiation to the left neck. Shortness of breath, dizziness, lightheadedness, nausea, vomiting, and weakness. EXAM: CHEST - 2 VIEW COMPARISON:  12/21/2023 FINDINGS: Power port type central venous catheter with tip over the cavoatrial junction region. No pneumothorax. Heart size and pulmonary vascularity are normal. Small bilateral pleural effusions with basilar atelectasis are similar to prior study. Mediastinal contours appear intact. Surgical clips in the right upper quadrant. Degenerative changes in the spine. IMPRESSION: Bilateral pleural effusions with basilar atelectasis, similar to prior study. Electronically Signed   By: Elsie Gravely M.D.   On: 01/01/2024 19:48   DG Chest 2 View Result Date: 12/21/2023 EXAM: 2 VIEW(S) XRAY OF THE CHEST 12/21/2023 04:52:11 AM COMPARISON: 09/26/2023 CLINICAL HISTORY: Chest pain. Complain of chest pain since Friday, shortness of breath present, progressively worse, feels like heaviness, nausea present as well. FINDINGS: LINES, TUBES AND DEVICES: Right IJ  port catheter in place to distal SVC. LUNGS AND PLEURA: Chronic coarsened interstitial markings identified bilaterally. Bibasilar atelectasis or opacities. Small bilateral pleural effusions. HEART AND MEDIASTINUM: Heart size at upper limits of normal. BONES AND SOFT TISSUES: No acute osseous abnormality. IMPRESSION: 1. Bibasilar atelectasis and small bilateral pleural effusions. Electronically signed by: Waddell Calk MD 12/21/2023 05:38 AM EDT RP Workstation: [id number]    Microbiology: Results for orders placed or performed during the hospital encounter of 12/21/23  Resp panel by RT-PCR (RSV, Flu A&B, Covid) Anterior Nasal Swab     Status: None   Collection Time: 12/21/23  6:31 AM   Specimen: Anterior Nasal Swab  Result Value Ref Range Status   SARS Coronavirus 2 by RT PCR NEGATIVE NEGATIVE Final    Comment: (NOTE) SARS-CoV-2 target nucleic acids are NOT DETECTED.  The SARS-CoV-2 RNA is generally detectable in upper respiratory specimens during the acute phase of infection. The lowest concentration of SARS-CoV-2 viral copies this assay can detect is 138 copies/mL. A negative result does not preclude SARS-Cov-2 infection and should not be used as the sole basis for treatment or other patient management decisions. A negative result may occur with  improper specimen collection/handling, submission of specimen other than nasopharyngeal swab, presence of viral mutation(s) within the areas targeted by this assay, and inadequate number of viral copies(<138 copies/mL). A negative result must be combined with clinical observations, patient history, and epidemiological information. The expected result is Negative.  Fact Sheet for Patients:  BloggerCourse.com  Fact Sheet for Healthcare Providers:  SeriousBroker.it  This test is no t yet approved or cleared by the [place]  FDA and  has been authorized for detection and/or diagnosis of  SARS-CoV-2 by FDA under an Emergency Use Authorization (EUA). This EUA will remain  in effect (meaning this test can be used) for the duration of the COVID-19 declaration under Section 564(b)(1) of the Act, 21 U.S.C.section 360bbb-3(b)(1), unless the authorization is terminated  or revoked sooner.       Influenza A by PCR NEGATIVE NEGATIVE Final   Influenza B by PCR NEGATIVE NEGATIVE Final    Comment: (NOTE) The Xpert Xpress SARS-CoV-2/FLU/RSV plus assay is intended as an aid in the diagnosis of influenza from Nasopharyngeal swab specimens and should not be used as a sole basis for treatment. Nasal washings and aspirates are unacceptable for Xpert Xpress SARS-CoV-2/FLU/RSV testing.  Fact Sheet for Patients: BloggerCourse.com  Fact Sheet for Healthcare Providers: SeriousBroker.it  This test is not yet approved or cleared by the [place]  FDA and has been authorized for detection and/or diagnosis of SARS-CoV-2  by FDA under an Emergency Use Authorization (EUA). This EUA will remain in effect (meaning this test can be used) for the duration of the COVID-19 declaration under Section 564(b)(1) of the Act, 21 U.S.C. section 360bbb-3(b)(1), unless the authorization is terminated or revoked.     Resp Syncytial Virus by PCR NEGATIVE NEGATIVE Final    Comment: (NOTE) Fact Sheet for Patients: BloggerCourse.com  Fact Sheet for Healthcare Providers: SeriousBroker.it  This test is not yet approved or cleared by the [place]  FDA and has been authorized for detection and/or diagnosis of SARS-CoV-2 by FDA under an Emergency Use Authorization (EUA). This EUA will remain in effect (meaning this test can be used) for the duration of the COVID-19 declaration under Section 564(b)(1) of the Act, 21 U.S.C. section 360bbb-3(b)(1), unless the authorization is terminated  or revoked.  Performed at [facility], [address], [place], [place] [address]     Labs: CBC: Recent Labs  Lab 01/01/24 1932 01/02/24 0430 01/02/24 1415 01/03/24 0419 01/03/24 1215  WBC 72.0* 57.1* 45.2* 54.7* 52.7*  HGB 7.4* 6.6* 7.6* 8.3* 7.7*  HCT 22.5* 20.4* 22.7* 24.3* 22.8*  MCV 89.3 89.5 88.3 87.7 88.0  PLT 15* 10* 28* 24* 21*   Basic Metabolic Panel: Recent Labs  Lab 01/01/24 1932 01/02/24 0430 01/03/24 0419  NA 128* 128* 130*  K 4.4 3.8 3.6  CL 96* 96* 98  CO2 22 22 22   GLUCOSE 101* 96 102*  BUN 17 16 10   CREATININE 0.72 0.71 0.64  CALCIUM 9.0 8.6* 8.6*   Liver Function Tests: Recent Labs  Lab 01/01/24 1932 01/02/24 0430  AST 46* 35  ALT 58* 48*  ALKPHOS 190* 162*  BILITOT 0.5 0.5  PROT 6.8 6.2*  ALBUMIN 3.7 3.4*   CBG: No results for input(s): GLUCAP in the last 168 hours.  Discharge time spent: {LESS THAN/GREATER UYJW:73611} 30 minutes.  Signed: Elgin Lam, MD [facility] 01/03/2024

## 2024-01-03 NOTE — Discharge Instructions (Signed)
 [employer],  You were in the hospital because of needing a blood and platelet transfusion. This is a normal recurrence for you, which I understand. You mentioned wanting to establish care closer to home, so I reached out to our oncologists here and they will attempt to set you up with Dr. Onesimo for ongoing management in [place]. Until then, please keep in touch with your [facility] physicians as you will likely need repeat blood work this upcoming week and possible repeat transfusion. Please return if you have uncontrolled bleeding.

## 2024-01-04 ENCOUNTER — Other Ambulatory Visit: Payer: Self-pay

## 2024-01-04 NOTE — Hospital Course (Signed)
 [name] is a 68 y.o. 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  Patient presented secondary to weakness and abdominal pain and found to have recurrent anemia and thrombocytopenia. Patient started on Protonix  for possible gastritis. She receive 1 unit of platelets and PRBC. Patient discharged with return precautions and plan for outpatient medical oncology follow-up.

## 2024-01-05 ENCOUNTER — Emergency Department (HOSPITAL_COMMUNITY)

## 2024-01-05 ENCOUNTER — Inpatient Hospital Stay (HOSPITAL_COMMUNITY)

## 2024-01-05 ENCOUNTER — Inpatient Hospital Stay (HOSPITAL_COMMUNITY)
Admission: EM | Admit: 2024-01-05 | Discharge: 2024-01-08 | DRG: 834 | Disposition: A | Discharge status: Home / Self Care | Attending: Student | Admitting: Student

## 2024-01-05 ENCOUNTER — Encounter (HOSPITAL_COMMUNITY): Payer: Self-pay

## 2024-01-05 ENCOUNTER — Other Ambulatory Visit: Payer: Self-pay

## 2024-01-05 DIAGNOSIS — E871 Hypo-osmolality and hyponatremia: Secondary | ICD-10-CM | POA: Diagnosis not present

## 2024-01-05 DIAGNOSIS — J9601 Acute respiratory failure with hypoxia: Secondary | ICD-10-CM | POA: Diagnosis present

## 2024-01-05 DIAGNOSIS — Z515 Encounter for palliative care: Secondary | ICD-10-CM | POA: Diagnosis not present

## 2024-01-05 DIAGNOSIS — Z7189 Other specified counseling: Secondary | ICD-10-CM

## 2024-01-05 DIAGNOSIS — Z8249 Family history of ischemic heart disease and other diseases of the circulatory system: Secondary | ICD-10-CM

## 2024-01-05 DIAGNOSIS — R590 Localized enlarged lymph nodes: Secondary | ICD-10-CM | POA: Diagnosis present

## 2024-01-05 DIAGNOSIS — Z880 Allergy status to penicillin: Secondary | ICD-10-CM

## 2024-01-05 DIAGNOSIS — Z604 Social exclusion and rejection: Secondary | ICD-10-CM | POA: Diagnosis present

## 2024-01-05 DIAGNOSIS — E222 Syndrome of inappropriate secretion of antidiuretic hormone: Secondary | ICD-10-CM | POA: Diagnosis present

## 2024-01-05 DIAGNOSIS — D696 Thrombocytopenia, unspecified: Secondary | ICD-10-CM | POA: Diagnosis not present

## 2024-01-05 DIAGNOSIS — K219 Gastro-esophageal reflux disease without esophagitis: Secondary | ICD-10-CM | POA: Diagnosis present

## 2024-01-05 DIAGNOSIS — D630 Anemia in neoplastic disease: Secondary | ICD-10-CM | POA: Diagnosis present

## 2024-01-05 DIAGNOSIS — G8929 Other chronic pain: Secondary | ICD-10-CM | POA: Diagnosis present

## 2024-01-05 DIAGNOSIS — C9200 Acute myeloblastic leukemia, not having achieved remission: Secondary | ICD-10-CM | POA: Diagnosis present

## 2024-01-05 DIAGNOSIS — J9690 Respiratory failure, unspecified, unspecified whether with hypoxia or hypercapnia: Secondary | ICD-10-CM

## 2024-01-05 DIAGNOSIS — E876 Hypokalemia: Secondary | ICD-10-CM | POA: Diagnosis present

## 2024-01-05 DIAGNOSIS — Z1152 Encounter for screening for COVID-19: Secondary | ICD-10-CM | POA: Diagnosis not present

## 2024-01-05 DIAGNOSIS — D6959 Other secondary thrombocytopenia: Secondary | ICD-10-CM | POA: Diagnosis present

## 2024-01-05 DIAGNOSIS — J869 Pyothorax without fistula: Secondary | ICD-10-CM | POA: Diagnosis present

## 2024-01-05 DIAGNOSIS — J9811 Atelectasis: Secondary | ICD-10-CM | POA: Diagnosis present

## 2024-01-05 DIAGNOSIS — C9290 Myeloid leukemia, unspecified, not having achieved remission: Secondary | ICD-10-CM

## 2024-01-05 DIAGNOSIS — Z79899 Other long term (current) drug therapy: Secondary | ICD-10-CM | POA: Diagnosis not present

## 2024-01-05 DIAGNOSIS — R7401 Elevation of levels of liver transaminase levels: Secondary | ICD-10-CM | POA: Diagnosis present

## 2024-01-05 DIAGNOSIS — F17210 Nicotine dependence, cigarettes, uncomplicated: Secondary | ICD-10-CM | POA: Diagnosis present

## 2024-01-05 DIAGNOSIS — Z885 Allergy status to narcotic agent status: Secondary | ICD-10-CM

## 2024-01-05 DIAGNOSIS — Z888 Allergy status to other drugs, medicaments and biological substances status: Secondary | ICD-10-CM

## 2024-01-05 DIAGNOSIS — J90 Pleural effusion, not elsewhere classified: Secondary | ICD-10-CM | POA: Diagnosis present

## 2024-01-05 DIAGNOSIS — Z602 Problems related to living alone: Secondary | ICD-10-CM | POA: Diagnosis present

## 2024-01-05 DIAGNOSIS — Z9221 Personal history of antineoplastic chemotherapy: Secondary | ICD-10-CM

## 2024-01-05 DIAGNOSIS — K2970 Gastritis, unspecified, without bleeding: Secondary | ICD-10-CM | POA: Diagnosis present

## 2024-01-05 DIAGNOSIS — Z803 Family history of malignant neoplasm of breast: Secondary | ICD-10-CM

## 2024-01-05 DIAGNOSIS — Z66 Do not resuscitate: Secondary | ICD-10-CM | POA: Diagnosis present

## 2024-01-05 DIAGNOSIS — F419 Anxiety disorder, unspecified: Secondary | ICD-10-CM | POA: Diagnosis present

## 2024-01-05 HISTORY — DX: Anemia, unspecified: D64.9

## 2024-01-05 HISTORY — DX: Thrombocytopenia, unspecified: D69.6

## 2024-01-05 HISTORY — DX: Hypo-osmolality and hyponatremia: E87.1

## 2024-01-05 HISTORY — DX: Atherosclerosis of aorta: I70.0

## 2024-01-05 HISTORY — DX: Acute myeloblastic leukemia, not having achieved remission: C92.00

## 2024-01-05 LAB — TYPE AND SCREEN
ABO/RH(D): A POS
Antibody Screen: NEGATIVE
Unit division: 0

## 2024-01-05 LAB — LACTATE DEHYDROGENASE, PLEURAL OR PERITONEAL FLUID: LD, Fluid: 1562 U/L — ABNORMAL HIGH (ref 3–23)

## 2024-01-05 LAB — BASIC METABOLIC PANEL WITH GFR
Anion gap: 12 (ref 5–15)
BUN: 15 mg/dL (ref 8–23)
CO2: 21 mmol/L — ABNORMAL LOW (ref 22–32)
Calcium: 8.9 mg/dL (ref 8.9–10.3)
Chloride: 93 mmol/L — ABNORMAL LOW (ref 98–111)
Creatinine, Ser: 0.8 mg/dL (ref 0.44–1.00)
GFR, Estimated: >60 mL/min (ref >60)
Glucose, Bld: 124 mg/dL — ABNORMAL HIGH (ref 70–99)
Potassium: 3.4 mmol/L — ABNORMAL LOW (ref 3.5–5.1)
Sodium: 126 mmol/L — ABNORMAL LOW (ref 135–145)

## 2024-01-05 LAB — CBC
HCT: 21.8 % — ABNORMAL LOW (ref 36.0–46.0)
Hemoglobin: 7.2 g/dL — ABNORMAL LOW (ref 12.0–15.0)
MCH: 29.1 pg (ref 26.0–34.0)
MCHC: 33 g/dL (ref 30.0–36.0)
MCV: 88.3 fL (ref 80.0–100.0)
Platelets: 21 K/uL — CL (ref 150–400)
RBC: 2.47 MIL/uL — ABNORMAL LOW (ref 3.87–5.11)
RDW: 15.8 % — ABNORMAL HIGH (ref 11.5–15.5)
WBC: 89.5 K/uL (ref 4.0–10.5)
nRBC: 0 % (ref 0.0–0.2)

## 2024-01-05 LAB — BODY FLUID CELL COUNT WITH DIFFERENTIAL
Eos, Fluid: 0 %
Lymphs, Fluid: 34 %
Monocyte-Macrophage-Serous Fluid: 10 % — ABNORMAL LOW (ref 50–90)
Neutrophil Count, Fluid: 13 % (ref 0–25)
Other Cells, Fluid: 43 %
Total Nucleated Cell Count, Fluid: 5591 uL — ABNORMAL HIGH (ref 0–1000)

## 2024-01-05 LAB — GLUCOSE, PLEURAL OR PERITONEAL FLUID: Glucose, Fluid: <20 mg/dL

## 2024-01-05 LAB — PROTEIN, PLEURAL OR PERITONEAL FLUID: Total protein, fluid: 4.5 g/dL

## 2024-01-05 LAB — BPAM PLATELET PHERESIS
Blood Product Expiration Date: 202509292359
ISSUE DATE / TIME: 202509260627
Unit Type and Rh: 6200

## 2024-01-05 LAB — ALBUMIN, PLEURAL OR PERITONEAL FLUID: Albumin, Fluid: 2.2 g/dL

## 2024-01-05 LAB — BPAM RBC
Blood Product Expiration Date: 202510112359
ISSUE DATE / TIME: 202509261004
Unit Type and Rh: 6200

## 2024-01-05 LAB — PREPARE PLATELET PHERESIS: Unit division: 0

## 2024-01-05 LAB — MRSA NEXT GEN BY PCR, NASAL: MRSA by PCR Next Gen: NOT DETECTED

## 2024-01-05 LAB — TROPONIN T, HIGH SENSITIVITY
Troponin T High Sensitivity: <15 ng/L (ref 0–19)
Troponin T High Sensitivity: 21 ng/L — ABNORMAL HIGH (ref 0–19)

## 2024-01-05 LAB — HEPATIC FUNCTION PANEL
ALT: 41 U/L (ref 0–44)
AST: 37 U/L (ref 15–41)
Albumin: 3.5 g/dL (ref 3.5–5.0)
Alkaline Phosphatase: 157 U/L — ABNORMAL HIGH (ref 38–126)
Bilirubin, Direct: 0.3 mg/dL — ABNORMAL HIGH (ref 0.0–0.2)
Indirect Bilirubin: 0.2 mg/dL — ABNORMAL LOW (ref 0.3–0.9)
Total Bilirubin: 0.5 mg/dL (ref 0.0–1.2)
Total Protein: 6.5 g/dL (ref 6.5–8.1)

## 2024-01-05 LAB — RESP PANEL BY RT-PCR (RSV, FLU A&B, COVID)  RVPGX2
Influenza A by PCR: NEGATIVE
Influenza B by PCR: NEGATIVE
Resp Syncytial Virus by PCR: NEGATIVE
SARS Coronavirus 2 by RT PCR: NEGATIVE

## 2024-01-05 LAB — PROTIME-INR
INR: 1.3 — ABNORMAL HIGH (ref 0.8–1.2)
Prothrombin Time: 17 s — ABNORMAL HIGH (ref 11.4–15.2)

## 2024-01-05 MED ORDER — ONDANSETRON HCL 4 MG/2ML IJ SOLN
4.0000 mg | Freq: Four times a day (QID) | INTRAMUSCULAR | Status: DC | PRN
  Administered 2024-01-05 – 2024-01-07 (×4): 4 mg via INTRAVENOUS
  Filled 2024-01-05 (×5): qty 2

## 2024-01-05 MED ORDER — SENNA 8.6 MG PO TABS
1.0000 | ORAL_TABLET | Freq: Every day | ORAL | Status: DC | PRN
  Administered 2024-01-08: 8.6 mg via ORAL
  Filled 2024-01-05: qty 1

## 2024-01-05 MED ORDER — MORPHINE SULFATE (PF) 2 MG/ML IV SOLN: 2.0000 mg | INTRAVENOUS | Status: DC | PRN

## 2024-01-05 MED ORDER — PANTOPRAZOLE SODIUM 20 MG PO TBEC
20.0000 mg | DELAYED_RELEASE_TABLET | Freq: Every day | ORAL | Status: DC
  Administered 2024-01-05 – 2024-01-08 (×4): 20 mg via ORAL
  Filled 2024-01-05 (×4): qty 1

## 2024-01-05 MED ORDER — ACETAMINOPHEN 650 MG RE SUPP: 650.0000 mg | Freq: Four times a day (QID) | RECTAL | Status: DC | PRN

## 2024-01-05 MED ORDER — PROMETHAZINE HCL 25 MG PO TABS: 12.5000 mg | ORAL_TABLET | Freq: Three times a day (TID) | ORAL | Status: DC | PRN

## 2024-01-05 MED ORDER — SODIUM CHLORIDE 0.9% IV SOLUTION: Freq: Once | INTRAVENOUS | Status: AC

## 2024-01-05 MED ORDER — ACETAMINOPHEN 325 MG PO TABS
650.0000 mg | ORAL_TABLET | Freq: Four times a day (QID) | ORAL | Status: DC | PRN
  Administered 2024-01-05 – 2024-01-08 (×2): 650 mg via ORAL
  Filled 2024-01-05 (×2): qty 2

## 2024-01-05 MED ORDER — VANCOMYCIN HCL IN DEXTROSE 1-5 GM/200ML-% IV SOLN
1000.0000 mg | Freq: Once | INTRAVENOUS | Status: AC
Start: 2024-01-05 — End: 2024-01-05
  Administered 2024-01-05: 1000 mg via INTRAVENOUS
  Filled 2024-01-05: qty 200

## 2024-01-05 MED ORDER — MORPHINE SULFATE (PF) 4 MG/ML IV SOLN
4.0000 mg | Freq: Once | INTRAVENOUS | Status: AC
  Administered 2024-01-05: 4 mg via INTRAVENOUS
  Filled 2024-01-05: qty 1

## 2024-01-05 MED ORDER — ALPRAZOLAM 0.5 MG PO TABS
0.5000 mg | ORAL_TABLET | Freq: Two times a day (BID) | ORAL | Status: DC
  Administered 2024-01-05 – 2024-01-08 (×6): 0.5 mg via ORAL
  Filled 2024-01-05 (×7): qty 1

## 2024-01-05 MED ORDER — MORPHINE SULFATE (PF) 4 MG/ML IV SOLN
4.0000 mg | INTRAVENOUS | Status: DC | PRN
Start: 2024-01-05 — End: 2024-01-08
  Administered 2024-01-05 – 2024-01-08 (×10): 4 mg via INTRAVENOUS
  Filled 2024-01-05 (×10): qty 1

## 2024-01-05 MED ORDER — PROCHLORPERAZINE MALEATE 10 MG PO TABS: 5.0000 mg | ORAL_TABLET | Freq: Four times a day (QID) | ORAL | Status: DC | PRN

## 2024-01-05 MED ORDER — FUROSEMIDE 40 MG PO TABS: 40.0000 mg | ORAL_TABLET | Freq: Every day | ORAL | Status: DC

## 2024-01-05 MED ORDER — ONDANSETRON HCL 4 MG PO TABS: 4.0000 mg | ORAL_TABLET | Freq: Four times a day (QID) | ORAL | Status: DC | PRN

## 2024-01-05 MED ORDER — SODIUM CHLORIDE 0.9 % IV SOLN
2.0000 g | Freq: Two times a day (BID) | INTRAVENOUS | Status: DC
  Administered 2024-01-05 – 2024-01-06 (×4): 2 g via INTRAVENOUS
  Filled 2024-01-05 (×4): qty 12.5

## 2024-01-05 MED ORDER — ENSURE PLUS HIGH PROTEIN PO LIQD
237.0000 mL | Freq: Two times a day (BID) | ORAL | Status: DC
  Administered 2024-01-06 – 2024-01-08 (×5): 237 mL via ORAL

## 2024-01-05 MED ORDER — VANCOMYCIN HCL 1250 MG/250ML IV SOLN
1250.0000 mg | Freq: Every day | INTRAVENOUS | Status: DC
  Administered 2024-01-05 – 2024-01-06 (×2): 1250 mg via INTRAVENOUS
  Filled 2024-01-05 (×2): qty 250

## 2024-01-05 MED ORDER — SODIUM CHLORIDE (PF) 0.9 % IJ SOLN
INTRAMUSCULAR | Status: AC
  Filled 2024-01-05: qty 50

## 2024-01-05 MED ORDER — ALBUTEROL SULFATE (2.5 MG/3ML) 0.083% IN NEBU
2.5000 mg | INHALATION_SOLUTION | RESPIRATORY_TRACT | Status: DC | PRN
  Administered 2024-01-07 (×2): 2.5 mg via RESPIRATORY_TRACT
  Filled 2024-01-05 (×3): qty 3

## 2024-01-05 MED ORDER — MORPHINE SULFATE (PF) 2 MG/ML IV SOLN
2.0000 mg | INTRAVENOUS | Status: DC | PRN
  Administered 2024-01-05: 2 mg via INTRAVENOUS
  Filled 2024-01-05: qty 1

## 2024-01-05 MED ORDER — OXYCODONE HCL 5 MG PO TABS: 5.0000 mg | ORAL_TABLET | ORAL | Status: DC | PRN

## 2024-01-05 MED ORDER — ONDANSETRON HCL 4 MG/2ML IJ SOLN
4.0000 mg | Freq: Once | INTRAMUSCULAR | Status: AC
  Administered 2024-01-05: 4 mg via INTRAVENOUS
  Filled 2024-01-05: qty 2

## 2024-01-05 MED ORDER — IOHEXOL 300 MG/ML  SOLN
75.0000 mL | Freq: Once | INTRAMUSCULAR | Status: AC | PRN
  Administered 2024-01-05: 75 mL via INTRAVENOUS

## 2024-01-05 MED ORDER — MORPHINE SULFATE (PF) 2 MG/ML IV SOLN
2.0000 mg | Freq: Once | INTRAVENOUS | Status: AC
  Administered 2024-01-05: 2 mg via INTRAVENOUS
  Filled 2024-01-05: qty 1

## 2024-01-05 MED ORDER — SODIUM CHLORIDE 0.9 % IV SOLN: INTRAVENOUS | Status: AC

## 2024-01-05 MED ORDER — HYDROXYZINE HCL 25 MG PO TABS
25.0000 mg | ORAL_TABLET | Freq: Three times a day (TID) | ORAL | Status: DC | PRN
  Administered 2024-01-07: 25 mg via ORAL
  Filled 2024-01-05: qty 1

## 2024-01-05 MED ORDER — TRAZODONE HCL 50 MG PO TABS
25.0000 mg | ORAL_TABLET | Freq: Every evening | ORAL | Status: DC | PRN
  Filled 2024-01-05: qty 1

## 2024-01-05 NOTE — Consult Note (Signed)
 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 CT chest with large right pleural effusion c/f empyema, stable small left pleural effusion, patchy air space of left lower lobe. Patient with multiple recent admissions for blood transfusion and a right-sided thoracentesis in June 2025. On arrival to ED, labs notable for worsening thrombocytopenia, transfused 1 unit platelets for platelet count of 12k and placed on 2L [place]. PCCM consulted for evaluation and management of right-sided pleural effusion.  Patient seen at bedside in ED, HDS, with labored respirations on 5L [place]. Discussed CT findings and treatment options for right-sided effusion, including thoracostomy tube vs thoracentesis. Patient did not want a chest tube and expressed wanting a thoracentesis. Discussed patient is high risk for bleeding with low platelet count and that we would transfuse another unit of platelets prior to the procedure. Patient expressed understanding of risks vs benefit and reports history of platelet transfusion for for past procedures. Patient reported she will be discussing GOC today and potential transfer to Hospice. Patient has pending floor bed, will plan to perform thoracentesis in the ED if platelets can be transfused prior to transferring to floor, or will need nursing support to monitor vitals if performing thoracentesis on floor.  Pertinent Medical History:   Past Medical History:  Diagnosis Date   AML (acute myeloid leukemia) (HCC)    Anemia    Aortic atherosclerosis    Chronic hyponatremia    GERD  (gastroesophageal reflux disease)    Thrombocytopenia     Past Surgical History:  Procedure Laterality Date   CARDIAC SURGERY     CHOLECYSTECTOMY     HIP ARTHROSCOPY     IR IMAGING GUIDED PORT INSERTION  05/19/2023   surgery on left foot Left     Significant Hospital Events: Including procedures, antibiotic start and stop dates in addition to other pertinent events   9/29: Pending right-sided thoracentesis  Interim History / Subjective:  See above  Objective    Blood pressure 122/69, pulse (!) 102, temperature 99.2 F (37.3 C), resp. rate (!) 24, SpO2 94%.        Intake/Output Summary (Last 24 hours) at 01/05/2024 1315 Last data filed at 01/05/2024 0915 Gross per 24 hour  Intake 604 ml  Output --  Net 604 ml   There were no vitals filed for this visit.  Examination: General: Chronically-ill appearing woman lying in bed HENT: Atraumatic, normocephalic, mmm Lungs: Coarse bilateral breath sounds, diminished BS in bilateral lung bases R>L Cardiovascular: RRR Abdomen: Soft, nondistended Extremities: normal ROM Neuro: A&Ox4, no focal deficits GU: NA  Resolved Problem List:   Assessment and Plan:   Neuro: Acute on chronic pain in setting of AML, recurrent pleural effusions -- Optimize MMPR -- Restarted on home xanax , trazodone   CV: Anemia of chronic disease requiring frequent blood transfusions Thrombocytopenia in setting of AML requiring frequent platelet transfusions -- Hgb stable 8.1 -- Plt 12k, Transfused 1 unit platelets, 1 additional unit of platelets to be transfused prior to procedure; expect  Pulmonary: Acute hypoxic respiratory insufficiency in setting of recurrent large right-sided pleural effusion c/f empyema -- June 2025 thoracentesis  for right-sided effusion -- CT Chest with large right pleural effusion c/f empyema, stable small left pleural effusion, patchy air space of left lower lobe. -- Labored breathing on 5L [place]; wean oxygen as indicated to  maintain SpO2 >94% -- Discussed options with patient as indicated above, will plan for bedside thoracentesis; 1 additional unit of platelets to transfuse prior to procedure -- Will send cultures -- Bronchopulmonary hygiene; albuterol  PRN  Heme/ID: AML; chronic leukocytosis to 91k prev on palliative hydroxyurea  Afebrile -- Empirically started on Cefepime /Vanc pending cultures -- Trend WBC and fever curve  FEN Regular diet Hold DVT ppx; patient is high bleeding risk in setting of anemia/thrombocytopenia GI ppx: resumed home Protonix   Labs:  CBC: Recent Labs  Lab 01/02/24 0430 01/02/24 1415 01/03/24 0419 01/03/24 1215 01/05/24 0451  WBC 57.1* 45.2* 54.7* 52.7* 91.6*  HGB 6.6* 7.6* 8.3* 7.7* 8.1*  HCT 20.4* 22.7* 24.3* 22.8* 24.2*  MCV 89.5 88.3 87.7 88.0 87.4  PLT 10* 28* 24* 21* 12*    Basic Metabolic Panel: Recent Labs  Lab 01/01/24 1932 01/02/24 0430 01/03/24 0419 01/05/24 0451  NA 128* 128* 130* 126*  K 4.4 3.8 3.6 3.4*  CL 96* 96* 98 93*  CO2 22 22 22  21*  GLUCOSE 101* 96 102* 124*  BUN 17 16 10 15   CREATININE 0.72 0.71 0.64 0.80  CALCIUM 9.0 8.6* 8.6* 8.9   GFR: Estimated Creatinine Clearance: 58.9 mL/min (by C-G formula based on SCr of 0.8 mg/dL). Recent Labs  Lab 01/02/24 1415 01/03/24 0419 01/03/24 1215 01/05/24 0451  WBC 45.2* 54.7* 52.7* 91.6*    Liver Function Tests: Recent Labs  Lab 01/01/24 1932 01/02/24 0430 01/05/24 0451  AST 46* 35 37  ALT 58* 48* 41  ALKPHOS 190* 162* 157*  BILITOT 0.5 0.5 0.5  PROT 6.8 6.2* 6.5  ALBUMIN 3.7 3.4* 3.5   Recent Labs  Lab 01/01/24 1932  LIPASE 28   No results for input(s): AMMONIA in the last 168 hours.  ABG    Component Value Date/Time   TCO2 27 04/11/2008 1222     Coagulation Profile: Recent Labs  Lab 01/05/24 1001  INR 1.3*    Cardiac Enzymes: No results for input(s): CKTOTAL, CKMB, CKMBINDEX, TROPONINI in the last 168 hours.  HbA1C: No results found for:  HGBA1C  CBG: No results for input(s): GLUCAP in the last 168 hours.   Past Medical History:  She,  has a past medical history of AML (acute myeloid leukemia) (HCC), Anemia, Aortic atherosclerosis, Chronic hyponatremia, GERD (gastroesophageal reflux disease), and Thrombocytopenia.   Surgical History:   Past Surgical History:  Procedure Laterality Date   CARDIAC SURGERY     CHOLECYSTECTOMY     HIP ARTHROSCOPY     IR IMAGING GUIDED PORT INSERTION  05/19/2023   surgery on left foot Left      Social History:   reports that she has been smoking cigarettes. She has a 1 pack-year smoking history. She has never used smokeless tobacco. She reports that she does not currently use alcohol. She reports that she does not currently use drugs.   Family History:  Her family history includes Cancer in her maternal grandfather, maternal grandmother, and mother; Heart failure in her father and sister.   Allergies Allergies  Allergen Reactions   Penicillin G Anaphylaxis   Prednisone Anaphylaxis    Pt states it turned her into a monster, lips started swelling and couldn't breath   Codeine Nausea And Vomiting   Heparin  Other (See  Comments)    Passed out      Home Medications  Prior to Admission medications   Medication Sig Start Date End Date Taking? Authorizing Provider  ALPRAZolam  (XANAX ) 0.5 MG tablet Take 0.5 mg by mouth 2 (two) times daily.    [provider]  ascorbic acid  (VITAMIN C ) 500 MG tablet Take 500 mg by mouth daily.    [provider]  b complex vitamins capsule Take 1 capsule by mouth daily.    [provider]  cholecalciferol  (VITAMIN D3) 10 MCG (400 UNIT) TABS tablet Take 400 Units by mouth daily.    [provider]  furosemide (LASIX) 40 MG tablet Take 40 mg by mouth daily. 12/19/23 12/26/23  [provider]  hydroxyurea  (HYDREA ) 500 MG capsule Take 1,000 mg by mouth in the morning, at noon, and at bedtime.    [provider]  hydrOXYzine  (ATARAX ) 25 MG tablet Take 25 mg by mouth 3 (three) times daily as needed for anxiety. 06/09/23   [provider]  nicotine  (NICODERM CQ  - DOSED IN MG/24 HOURS) 21 mg/24hr patch Place 1 patch (21 mg total) onto the skin daily. 07/08/23   Swayze, Ava, DO  ondansetron  (ZOFRAN -ODT) 8 MG disintegrating tablet Take 8 mg by mouth every 8 (eight) hours as needed for nausea or vomiting. 12/15/23 12/14/24  [provider]  pantoprazole  (PROTONIX ) 20 MG tablet TAKE ONE TABLET (20 MG TOTAL) BY MOUTH DAILY. 07/23/23   Rao, Archana C, MD  polyethylene glycol (MIRALAX  / GLYCOLAX ) 17 g packet Take 17 g by mouth daily as needed for mild constipation.    [provider]  prochlorperazine  (COMPAZINE ) 10 MG tablet Take 10 mg by mouth daily as needed for nausea or vomiting. 12/16/23   [provider]  promethazine  (PHENERGAN ) 25 MG tablet Take 25 mg by mouth daily as needed for nausea or vomiting. 12/15/23   [provider]  traMADol  (ULTRAM ) 50 MG tablet Take 50 mg by mouth every 6 (six) hours as needed for moderate pain (pain score 4-6). 09/23/23 09/22/24  [provider]     Critical care time: 50 minutes    Kanya Potteiger, DNP, AGACNP-BC [facility]  Please see [email or web address] for pager details.  From 7A-7P if no response, please call [phone]. After hours, please call ELink [phone].

## 2024-01-05 NOTE — Consult Note (Addendum)
 [facility] CONSULT NOTE  Patient Care Team: Melanee Annah BROCKS, MD as PCP - General (Oncology)  CHIEF COMPLAINTS/PURPOSE OF CONSULTATION:  History of AML  REFERRING PHYSICIAN: Dr. Zella  HISTORY OF PRESENTING ILLNESS:  [name] 68 y.o. 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  Patient follows with [facility] for her cancer care.  She was admitted on 01/05/2024 with complaints of shortness of breath with right-sided chest pain.  Oncology evaluation is requested due to her oncologic history.   Of note, patient was recently in the ED at [facility] on 01/02/2024 with complaints of weakness and abdominal pain.  She was subsequently discharged on 01/03/2024.  Patient at that time stated interest in transferring her oncologic care to [facility]. Patient is seen awake and alert laying in bed in the ED.  She is chronically ill-appearing.  On O2 via nasal cannula.  Family member at bedside.  Patient reports that she was diagnosed with AML in November last year and has been treated at both [facility] and [place] where she mostly gets blood products as needed.  She is interested in transferring care to [facility] due to the logistics of driving to [facility] and states that it takes all day when she goes to [facility]. Medical history as stated significant for AML, currently on hydroxyurea . Surgical history includes gallbladder removal, left hip surgery, and foot surgery. Family oncologic history includes mother with breast cancer. Social history significant for tobacco use which she started very young admits to still smoking 2 cigarettes/day.  Denies alcohol use.  Denies illicit or recreational drug use.  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     I have reviewed her chart and materials related to her cancer extensively and collaborated history with the patient. 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 - WBC 91.6 today - On hydroxyurea  - Imaging done 9/29 shows stable mediastinal, hilar, upper abdominal adenopathy. - Seen earlier today by palliative for goals of care discussions. - Follows with [facility]/Dr. Chandra.  Patient reports that she often goes to [facility] when she needs blood products. - Medical oncology/Dr. Lanny will make further evaluation and treatment recommendations.  Pleural effusion Right-sided chest pain - Enlarging right pleural effusion suspicious for empyema seen on CT imaging done 9/29 - Seen by pulmonary.  Likely plan for thoracentesis - Will continue to monitor  Anemia -Hemoglobin low 8.1 - Status post PRBC transfusion 9/26 - Recommend PRBC transfusion for hemoglobin <7.0  Thrombocytopenia -Platelets low 12K - Status post platelet transfusion given today - Recommend platelet transfusion for counts <20 K or <50 K with active bleeding     MEDICAL HISTORY:  Past Medical History:  Diagnosis Date   AML (acute myeloid leukemia) (HCC)    Anemia    Aortic atherosclerosis    Chronic hyponatremia    GERD (gastroesophageal reflux disease)    Thrombocytopenia     SURGICAL HISTORY: Past Surgical History:  Procedure Laterality Date   CARDIAC SURGERY     CHOLECYSTECTOMY     HIP ARTHROSCOPY     IR IMAGING GUIDED PORT INSERTION  05/19/2023   surgery on left foot Left     SOCIAL HISTORY: Social History   Socioeconomic History   Marital status: Widowed    Spouse name: Not on file   Number of children: Not on file   Years of education: Not on file   Highest education level: Not on file  Occupational History   Not on file  Tobacco  Use   Smoking status: Every Day    Current packs/day: 0.25    Average packs/day: 0.3 packs/day for 4.0 years (1.0 ttl pk-yrs)    Types: Cigarettes   Smokeless tobacco: Never  Vaping Use   Vaping status: Never Used  Substance and Sexual Activity   Alcohol use: Not Currently   Drug use: Not Currently   Sexual activity: Not Currently  Other Topics Concern   Not on file  Social History Narrative   Not on file   Social Drivers of Health   Financial Resource Strain: Low Risk  (06/05/2023)   Received from [facility]   Overall Financial Resource Strain (CARDIA)    Difficulty of Paying Living Expenses: Not very hard  Food Insecurity: No Food Insecurity (01/03/2024)   Hunger Vital Sign    Worried About Running Out of Food in the Last Year: Never true    Ran Out of Food in the Last Year: Never true  Transportation Needs: No Transportation Needs (01/03/2024)   PRAPARE - Administrator, Civil Service (Medical): No    Lack of Transportation (Non-Medical): No  Physical Activity: Insufficiently Active (03/22/2021)   Exercise Vital Sign    Days of Exercise per Week: 2 days    Minutes of Exercise per Session: 10 min  Stress: No Stress Concern Present (03/22/2021)   [employer] of Occupational Health - Occupational Stress Questionnaire    Feeling of Stress : Not at all  Social Connections: Socially Isolated (01/02/2024)   Social Connection and Isolation Panel    Frequency of Communication with Friends and Family: Three times a week    Frequency of Social Gatherings with Friends and Family: Once a week    Attends Religious Services: Never    Database administrator or Organizations: No    Attends Banker Meetings: Never    Marital Status: Widowed  Intimate Partner Violence: Not At Risk (01/03/2024)   Humiliation, Afraid, Rape, and Kick questionnaire    Fear of Current or Ex-Partner: No    Emotionally Abused: No    Physically Abused: No    Sexually Abused:  No    FAMILY HISTORY: Family History  Problem Relation Age of Onset   Cancer Mother    Heart failure Father    Heart failure Sister    Cancer Maternal Grandmother    Cancer Maternal Grandfather      PHYSICAL EXAMINATION: ECOG PERFORMANCE STATUS: 3 - Symptomatic, >50% confined to bed  Vitals:   01/05/24 0700 01/05/24 0813  BP: 123/67 (!) 113/57  Pulse: 98 95  Resp: 14 (!) 22  Temp:  98.2 F (36.8 C)  SpO2: 98% 95%   There were no vitals filed for this visit.  GENERAL: alert, +moderate respiratory distress distress +ill-appearing SKIN: skin color, texture, turgor are normal, no rashes or significant lesions EYES: normal, conjunctiva are pink and non-injected, sclera clear OROPHARYNX: no exudate, no erythema and lips, buccal mucosa, and tongue normal  NECK: supple, thyroid normal size, non-tender, without nodularity LYMPH: no palpable lymphadenopathy in the cervical, axillary or inguinal LUNGS: +  Right side diminished to auscultation +dyspnea  HEART: regular rate & rhythm and no murmurs and no lower extremity edema ABDOMEN: abdomen soft, non-tender and normal bowel sounds MUSCULOSKELETAL: no cyanosis of digits and no clubbing  PSYCH: alert & oriented x 3 with fluent speech NEURO: no focal motor/sensory deficits   ALLERGIES:  is allergic to penicillin g, prednisone, codeine, and heparin .  MEDICATIONS:  Current Facility-Administered Medications  Medication Dose Route Frequency Provider Last Rate Last Admin   0.9 %  sodium chloride  infusion   Intravenous Continuous Zella, Mir M, MD 100 mL/hr at 01/05/24 1137 New Bag at 01/05/24 1137   acetaminophen  (TYLENOL ) tablet 650 mg  650 mg Oral Q6H PRN Zella, Mir M, MD       Or   acetaminophen  (TYLENOL ) suppository 650 mg  650 mg Rectal Q6H PRN Zella, Mir M, MD       albuterol  (PROVENTIL ) (2.5 MG/3ML) 0.083% nebulizer solution 2.5 mg  2.5 mg Nebulization Q2H PRN Zella, Mir M, MD       ALPRAZolam  (XANAX ) tablet  0.5 mg  0.5 mg Oral BID Zella, Mir M, MD       hydrOXYzine  (ATARAX ) tablet 25 mg  25 mg Oral TID PRN Zella, Mir M, MD       morphine  (PF) 2 MG/ML injection 2 mg  2 mg Intravenous Q3H PRN Parker, Alicia C, NP       ondansetron  (ZOFRAN ) tablet 4 mg  4 mg Oral Q6H PRN Zella, Mir M, MD       Or   ondansetron  (ZOFRAN ) injection 4 mg  4 mg Intravenous Q6H PRN Zella, Mir M, MD       pantoprazole  (PROTONIX ) EC tablet 20 mg  20 mg Oral Daily Ikramullah, Mir M, MD       prochlorperazine  (COMPAZINE ) tablet 5 mg  5 mg Oral Q6H PRN Parker, Alicia C, NP       promethazine  (PHENERGAN ) tablet 12.5 mg  12.5 mg Oral Q8H PRN Parker, Alicia C, NP       traZODone  (DESYREL ) tablet 25 mg  25 mg Oral QHS PRN Zella, Mir M, MD       Current Outpatient Medications  Medication Sig Dispense Refill   ALPRAZolam  (XANAX ) 0.5 MG tablet Take 0.5 mg by mouth 2 (two) times daily.     ascorbic acid  (VITAMIN C ) 500 MG tablet Take 500 mg by mouth daily.     b complex vitamins capsule Take 1 capsule by mouth daily.     cholecalciferol  (VITAMIN D3) 10 MCG (400 UNIT) TABS tablet Take 400 Units by mouth daily.     furosemide (LASIX) 40 MG tablet Take 40 mg by mouth daily.     hydroxyurea  (HYDREA ) 500 MG capsule Take 1,000 mg by mouth in the morning, at noon, and at bedtime.     hydrOXYzine  (ATARAX ) 25 MG tablet Take 25 mg by mouth 3 (three) times daily as needed for anxiety.     nicotine  (NICODERM CQ  - DOSED IN MG/24 HOURS) 21 mg/24hr patch Place 1 patch (21 mg total) onto the skin daily. 28 patch 0   ondansetron  (ZOFRAN -ODT) 8 MG disintegrating tablet Take 8 mg by mouth every 8 (eight) hours as needed for nausea or vomiting.     pantoprazole  (PROTONIX ) 20 MG tablet TAKE ONE TABLET (20 MG TOTAL) BY MOUTH DAILY. 30 tablet 0   polyethylene glycol (MIRALAX  / GLYCOLAX ) 17 g packet Take 17 g by mouth daily as needed for mild constipation.  prochlorperazine  (COMPAZINE ) 10 MG tablet Take 10 mg by mouth daily as  needed for nausea or vomiting.     promethazine  (PHENERGAN ) 25 MG tablet Take 25 mg by mouth daily as needed for nausea or vomiting.     traMADol  (ULTRAM ) 50 MG tablet Take 50 mg by mouth every 6 (six) hours as needed for moderate pain (pain score 4-6).     Facility-Administered Medications Ordered in Other Encounters  Medication Dose Route Frequency Provider Last Rate Last Admin   loperamide  (IMODIUM ) capsule 4 mg  4 mg Oral Once Dasie Tinnie MATSU, NP         LABORATORY DATA:  I have reviewed the data as listed Lab Results  Component Value Date   WBC 91.6 (HH) 01/05/2024   HGB 8.1 (L) 01/05/2024   HCT 24.2 (L) 01/05/2024   MCV 87.4 01/05/2024   PLT 12 (LL) 01/05/2024   Recent Labs    12/21/23 0440 12/22/23 0236 01/01/24 1932 01/02/24 0430 01/03/24 0419 01/05/24 0451  NA 126*   < > 128* 128* 130* 126*  K 3.2*   < > 4.4 3.8 3.6 3.4*  CL 92*   < > 96* 96* 98 93*  CO2 22   < > 22 22 22  21*  GLUCOSE 116*   < > 101* 96 102* 124*  BUN 14   < > 17 16 10 15   CREATININE 0.65   < > 0.72 0.71 0.64 0.80  CALCIUM 8.4*   < > 9.0 8.6* 8.6* 8.9  GFRNONAA >60   < > >60 >60 >60 >60  PROT 5.8*  --  6.8 6.2*  --  6.5  ALBUMIN 3.3*  --  3.7 3.4*  --  3.5  AST 27  --  46* 35  --  37  ALT 41  --  58* 48*  --  41  ALKPHOS 100  --  190* 162*  --  157*  BILITOT 0.4  --  0.5 0.5  --  0.5  BILIDIR 0.2  --   --   --   --  0.3*  IBILI 0.2*  --   --   --   --  0.2*   < > = values in this interval not displayed.    RADIOGRAPHIC STUDIES: I have personally reviewed the radiological images as listed and agreed with the findings in the report. CT CHEST W CONTRAST Result Date: 01/05/2024 CLINICAL DATA:  Pleural effusion, malignancy suspected Chest pain and shortness of breath since yesterday. EXAM: CT CHEST WITH CONTRAST TECHNIQUE: Multidetector CT imaging of the chest was performed during intravenous contrast administration. RADIATION DOSE REDUCTION: This exam was performed according to the departmental  dose-optimization program which includes automated exposure control, adjustment of the mA and/or kV according to patient size and/or use of iterative reconstruction technique. CONTRAST:  75mL OMNIPAQUE  IOHEXOL  300 MG/ML  SOLN COMPARISON:  Chest CTA 01/02/2024, 09/24/2023 and 07/04/2023. FINDINGS: Cardiovascular: Accessed right IJ Port-A-Cath extends into the upper right atrium. No acute vascular findings are demonstrated. Mild atherosclerosis of the aorta, great vessels and coronary arteries. Calcifications of the aortic valve. The heart size is normal. There is no pericardial effusion. Mediastinum/Nodes: Multiple enlarged mediastinal and hilar lymph nodes are again noted, similar to recent prior studies. Representative lymph nodes include a 2.0 cm right hilar node on image 28/3 and a 1.9 cm subcarinal node on image 28/3. No progressive adenopathy identified in the short interval from the previous chest CT. There are small axillary lymph  nodes bilaterally. The thyroid gland, trachea and esophagus demonstrate no significant findings. Lungs/Pleura: Organizing and enlarging right pleural effusion is associated with thin pleural enhancement, suspicious for empyema. There is a small dependent left pleural effusion without suspicious pleural enhancement. Increasing compressive atelectasis dependently in the right upper, middle and lower lobes. Unchanged patchy airspace disease posteromedially in the left lower lobe. Upper abdomen: No acute findings are seen within the visualized upper abdomen. The spleen is moderately enlarged. There are mildly enlarged lymph nodes within the visualized upper abdomen, grossly stable. Musculoskeletal/Chest wall: There is no chest wall mass or suspicious osseous finding. Mild multilevel spondylosis. IMPRESSION: 1. Organizing and enlarging right pleural effusion with thin pleural enhancement, suspicious for empyema. Correlate clinically. Recommend thoracentesis. 2. Increasing compressive  atelectasis dependently in the right upper, middle and lower lobes. 3. Stable small left pleural effusion with patchy airspace disease posteromedially in the left lower lobe. 4. 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 Given the pleural process, some of these lymph nodes could be reactive. Associated moderate splenomegaly. 5.  Aortic Atherosclerosis (ICD10-I70.0). Electronically Signed   By: Elsie Perone M.D.   On: 01/05/2024 10:38   DG Chest 2 View Result Date: 01/05/2024 CLINICAL DATA:  68 year old female with chest pain.  Leukemia. EXAM: CHEST - 2 VIEW COMPARISON:  CTA chest 01/02/2024 and earlier. FINDINGS: PA and lateral views 0425 hours. Right chest Port-A-Cath is stable. Combined layering and loculated pleural effusions right greater than left with veiling opacity in the lungs. That on the right has progressed since 01/01/2024. No superimposed pneumothorax or pulmonary edema. No air bronchograms. Stable cardiac size and mediastinal contours - with mediastinal and hilar lymphadenopathy better demonstrated by CT. Visualized tracheal air column is within normal limits. No acute osseous abnormality identified. Stable cholecystectomy clips. Nonobstructed visible bowel gas pattern. IMPRESSION: Progressed appearance of Right lung combined layering and loculated pleural effusion since 01/01/2024 CT. Stable smaller left pleural effusion. Known mediastinal lymphadenopathy. Electronically Signed   By: VEAR Hurst M.D.   On: 01/05/2024 05:01   CT Angio Chest PE W and/or Wo Contrast Result Date: 01/02/2024 CLINICAL DATA:  Chest pain radiating to the left neck. Shortness of breath, dizziness, lightheadedness, nausea/vomiting, and weakness. History of leukemia. EXAM: CT ANGIOGRAPHY CHEST CT ABDOMEN AND PELVIS WITH CONTRAST TECHNIQUE: Multidetector CT imaging of the chest was performed using the standard protocol during bolus administration of  intravenous contrast. Multiplanar CT image reconstructions and MIPs were obtained to evaluate the vascular anatomy. Multidetector CT imaging of the abdomen and pelvis was performed using the standard protocol during bolus administration of intravenous contrast. RADIATION DOSE REDUCTION: This exam was performed according to the departmental dose-optimization program which includes automated exposure control, adjustment of the mA and/or kV according to patient size and/or use of iterative reconstruction technique. CONTRAST:  OMNIPAQUE  IOHEXOL  350 MG/ML SOLN COMPARISON:  CTA chest dated 09/24/2023 and CT abdomen/pelvis dated 05/17/2023 FINDINGS: CTA CHEST FINDINGS Cardiovascular: Satisfactory opacification of the pulmonary arteries to the segmental level. No evidence of pulmonary embolism. Normal heart size. No pericardial effusion. Mild thoracic aortic atherosclerosis. Mediastinum/Nodes: Mediastinal and bilateral perihilar lymphadenopathy progressive, including: --dominant 2.2 cm subcarinal node (image 87), progressive --19 mm short axis right Perihilar Node --11 mm short axis left axillary node, progressive Right chest port terminates at the cavoatrial junction. Lungs/Pleura: Moderate right pleural effusion, grossly unchanged, with associated compressive atelectasis in the right lower lobe. Small left pleural effusion, new, with associated dependent atelectasis.  No pneumothorax. Musculoskeletal: Mild degenerative changes of the thoracic spine. Review of the MIP images confirms the above findings. CT ABDOMEN and PELVIS FINDINGS Hepatobiliary: No focal liver abnormality is seen. Status post cholecystectomy. Mild central intrahepatic and extrahepatic ductal prominence, postsurgical. Pancreas: Unremarkable. No pancreatic ductal dilatation or surrounding inflammatory changes. Spleen: Moderate splenomegaly, measuring 17.0 cm in maximal craniocaudal dimension, progressive. Adrenals/Urinary Tract: Adrenal glands are  unremarkable. Kidneys are normal, without renal calculi, focal lesion, or hydronephrosis. Bladder is unremarkable. Stomach/Bowel: Stomach is within normal limits. Appendix appears normal (image 61). No evidence of bowel wall thickening, distention, or inflammatory changes. Vascular/Lymphatic: Atherosclerotic calcifications of the abdominal aorta and branch vessels, although patent. Progressive abdominopelvic lymphadenopathy, including: --15 mm short axis portacaval node (image 30) --13 mm short axis jejunal mesenteric node (image 45) --9 mm short axis left perirectal node (image 70) Reproductive: Calcified uterine fibroids.  No adnexal masses. Other: No abdominopelvic ascites Musculoskeletal: Mild degenerative changes of the lumbar spine. Review of the MIP images confirms the above findings. IMPRESSION: No evidence of pulmonary embolism. Progressive thoracic and abdominopelvic lymphadenopathy, as above, compatible with the patient's known leukemia. Moderate splenomegaly, progressive. Moderate right pleural effusion, grossly unchanged. Small left pleural effusion, new. Aortic Atherosclerosis (ICD10-I70.0). Electronically Signed   By: Pinkie Pebbles M.D.   On: 01/02/2024 01:45   CT ABDOMEN PELVIS W CONTRAST Result Date: 01/02/2024 CLINICAL DATA:  Chest pain radiating to the left neck. Shortness of breath, dizziness, lightheadedness, nausea/vomiting, and weakness. History of leukemia. EXAM: CT ANGIOGRAPHY CHEST CT ABDOMEN AND PELVIS WITH CONTRAST TECHNIQUE: Multidetector CT imaging of the chest was performed using the standard protocol during bolus administration of intravenous contrast. Multiplanar CT image reconstructions and MIPs were obtained to evaluate the vascular anatomy. Multidetector CT imaging of the abdomen and pelvis was performed using the standard protocol during bolus administration of intravenous contrast. RADIATION DOSE REDUCTION: This exam was performed according to the departmental  dose-optimization program which includes automated exposure control, adjustment of the mA and/or kV according to patient size and/or use of iterative reconstruction technique. CONTRAST:  OMNIPAQUE  IOHEXOL  350 MG/ML SOLN COMPARISON:  CTA chest dated 09/24/2023 and CT abdomen/pelvis dated 05/17/2023 FINDINGS: CTA CHEST FINDINGS Cardiovascular: Satisfactory opacification of the pulmonary arteries to the segmental level. No evidence of pulmonary embolism. Normal heart size. No pericardial effusion. Mild thoracic aortic atherosclerosis. Mediastinum/Nodes: Mediastinal and bilateral perihilar lymphadenopathy progressive, including: --dominant 2.2 cm subcarinal node (image 87), progressive --19 mm short axis right Perihilar Node --11 mm short axis left axillary node, progressive Right chest port terminates at the cavoatrial junction. Lungs/Pleura: Moderate right pleural effusion, grossly unchanged, with associated compressive atelectasis in the right lower lobe. Small left pleural effusion, new, with associated dependent atelectasis. No pneumothorax. Musculoskeletal: Mild degenerative changes of the thoracic spine. Review of the MIP images confirms the above findings. CT ABDOMEN and PELVIS FINDINGS Hepatobiliary: No focal liver abnormality is seen. Status post cholecystectomy. Mild central intrahepatic and extrahepatic ductal prominence, postsurgical. Pancreas: Unremarkable. No pancreatic ductal dilatation or surrounding inflammatory changes. Spleen: Moderate splenomegaly, measuring 17.0 cm in maximal craniocaudal dimension, progressive. Adrenals/Urinary Tract: Adrenal glands are unremarkable. Kidneys are normal, without renal calculi, focal lesion, or hydronephrosis. Bladder is unremarkable. Stomach/Bowel: Stomach is within normal limits. Appendix appears normal (image 61). No evidence of bowel wall thickening, distention, or inflammatory changes. Vascular/Lymphatic: Atherosclerotic calcifications of the abdominal  aorta and branch vessels, although patent. Progressive abdominopelvic lymphadenopathy, including: --15 mm short axis portacaval node (image 30) --13 mm short axis jejunal mesenteric  node (image 45) --9 mm short axis left perirectal node (image 70) Reproductive: Calcified uterine fibroids.  No adnexal masses. Other: No abdominopelvic ascites Musculoskeletal: Mild degenerative changes of the lumbar spine. Review of the MIP images confirms the above findings. IMPRESSION: No evidence of pulmonary embolism. Progressive thoracic and abdominopelvic lymphadenopathy, as above, compatible with the patient's known leukemia. Moderate splenomegaly, progressive. Moderate right pleural effusion, grossly unchanged. Small left pleural effusion, new. Aortic Atherosclerosis (ICD10-I70.0). Electronically Signed   By: Pinkie Pebbles M.D.   On: 01/02/2024 01:45   DG Chest 2 View Result Date: 01/01/2024 CLINICAL DATA:  Chest pain. Central chest pain starting today with radiation to the left neck. Shortness of breath, dizziness, lightheadedness, nausea, vomiting, and weakness. EXAM: CHEST - 2 VIEW COMPARISON:  12/21/2023 FINDINGS: Power port type central venous catheter with tip over the cavoatrial junction region. No pneumothorax. Heart size and pulmonary vascularity are normal. Small bilateral pleural effusions with basilar atelectasis are similar to prior study. Mediastinal contours appear intact. Surgical clips in the right upper quadrant. Degenerative changes in the spine. IMPRESSION: Bilateral pleural effusions with basilar atelectasis, similar to prior study. Electronically Signed   By: Elsie Gravely M.D.   On: 01/01/2024 19:48   DG Chest 2 View Result Date: 12/21/2023 EXAM: 2 VIEW(S) XRAY OF THE CHEST 12/21/2023 04:52:11 AM COMPARISON: 09/26/2023 CLINICAL HISTORY: Chest pain. Complain of chest pain since Friday, shortness of breath present, progressively worse, feels like heaviness, nausea present as well. FINDINGS:  LINES, TUBES AND DEVICES: Right IJ port catheter in place to distal SVC. LUNGS AND PLEURA: Chronic coarsened interstitial markings identified bilaterally. Bibasilar atelectasis or opacities. Small bilateral pleural effusions. HEART AND MEDIASTINUM: Heart size at upper limits of normal. BONES AND SOFT TISSUES: No acute osseous abnormality. IMPRESSION: 1. Bibasilar atelectasis and small bilateral pleural effusions. Electronically signed by: Waddell Calk MD 12/21/2023 05:38 AM EDT RP Workstation: [id number]     The total time spent in the appointment was 55 minutes encounter with patients including review of chart and various tests results, discussions about plan of care and coordination of care plan   All questions were answered. The patient knows to call the clinic with any problems, questions or concerns. No barriers to learning was detected.  Olam PARAS Rouson, NP 9/29/202511:50 AM    Addendum I have seen the patient, examined her. I agree with the assessment and and plan and have edited the notes.   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  She was found to have enlarging right pleural effusion, status post thoracentesis this afternoon.  She was admitted last week for severe anemia and thrombocytopenia, required multiple blood transfusions.  She has persistent anemia and thrombocytopenia.  I discussed the overall very poor prognosis, and limited treatment options, and the goal of care is palliative.  I honestly told patient that I do not think we can prolong her life, and she is tired of coming to the hospital, so I recommend palliative care and hospice care.  I explained to her the logistics and the benefits of hospice, and no blood and platelet transfusion when she is on hospice.  She is open to meet hospice team, I will message [facility] hospice  liaison to meet her in the hospital.  She was seen by palliative care team today also.  I will follow-up in a few days.  Agree with blood  in the platelet transfusion as needed.   Onita Mattock MD 01/05/2024

## 2024-01-05 NOTE — H&P (Signed)
 History and Physical  [name] FMW:[id number] DOB: 11-Oct-1955 DOA: 01/05/2024  PCP: Melanee Annah BROCKS, MD   Chief Complaint: SOB, R sided chest pain   HPI: [name] is a 68 y.o. 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  She has recently required numerous blood and platelet transfusions due to anemia and thrombocytopenia related to her AML.  She is followed by Dr. Chandra at [facility], but often is admitted to the hospital here as she lives locally.  This is her fourth admission to [facility] since June.  In June she was admitted to the hospital and required blood and platelet transfusion, at that time she also had right-sided thoracentesis of 770 mL of bloody fluid.  Etiology of her effusion was unclear, cultures were negative.  Most recently, she was discharged from the hospital on 9/27 after receiving 1 unit PRBC, and 1 unit of platelets.  She now returns with fatigue, shortness of breath, and right sided chest pain which she had previously with her effusion.  Workup in the emergency department as detailed below shows evidence of worsening thrombocytopenia and pleural effusion.  She has been placed on 2 L nasal cannula oxygen, 1 unit platelet is being transfused now.  Patient denies any sputum production, cough, or fevers.  Review of Systems: Please see HPI for pertinent positives and negatives. A complete 10 system review of systems are otherwise negative.  Past Medical History:  Diagnosis Date   GERD (gastroesophageal reflux disease)    Leukemia (HCC)    Past Surgical History:  Procedure Laterality Date   CARDIAC SURGERY     CHOLECYSTECTOMY     HIP ARTHROSCOPY     IR IMAGING GUIDED PORT INSERTION  05/19/2023   surgery on left foot Left    Social History:  reports that she has been  smoking cigarettes. She has a 1 pack-year smoking history. She has never used smokeless tobacco. She reports that she does not currently use alcohol. She reports that she does not currently use drugs.  Allergies  Allergen Reactions   Penicillin G Anaphylaxis   Prednisone Anaphylaxis    Pt states it turned her into a monster, lips started swelling and couldn't breath   Codeine Nausea And Vomiting   Heparin  Other (See Comments)    Passed out     Family History  Problem Relation Age of Onset   Cancer Mother    Heart failure Father    Heart failure Sister    Cancer Maternal Grandmother    Cancer Maternal Grandfather      Prior to Admission medications   Medication Sig Start Date End Date Taking? Authorizing Provider  ALPRAZolam  (XANAX ) 0.5 MG tablet Take 0.5 mg by mouth 2 (two) times daily.    [provider]  ascorbic acid  (VITAMIN C ) 500 MG tablet Take 500 mg by mouth daily.    [provider]  b complex vitamins capsule Take 1 capsule by mouth daily.    [provider]  cholecalciferol  (VITAMIN D3) 10 MCG (400 UNIT) TABS tablet Take 400 Units by mouth daily.    [provider]  furosemide (LASIX) 40 MG tablet Take 40 mg by mouth daily. 12/19/23 12/26/23  [provider]  hydroxyurea  (HYDREA ) 500 MG capsule Take 1,000 mg by mouth in the morning, at noon, and at bedtime.  [provider]  hydrOXYzine  (ATARAX ) 25 MG tablet Take 25 mg by mouth 3 (three) times daily as needed for anxiety. 06/09/23   [provider]  nicotine  (NICODERM CQ  - DOSED IN MG/24 HOURS) 21 mg/24hr patch Place 1 patch (21 mg total) onto the skin daily. 07/08/23   Swayze, Ava, DO  ondansetron  (ZOFRAN -ODT) 8 MG disintegrating tablet Take 8 mg by mouth every 8 (eight) hours as needed for nausea or vomiting. 12/15/23 12/14/24  [provider]  pantoprazole  (PROTONIX ) 20 MG tablet TAKE ONE TABLET (20 MG TOTAL) BY MOUTH DAILY. 07/23/23   Rao, Archana C, MD   polyethylene glycol (MIRALAX  / GLYCOLAX ) 17 g packet Take 17 g by mouth daily as needed for mild constipation.    [provider]  prochlorperazine  (COMPAZINE ) 10 MG tablet Take 10 mg by mouth daily as needed for nausea or vomiting. 12/16/23   [provider]  promethazine  (PHENERGAN ) 25 MG tablet Take 25 mg by mouth daily as needed for nausea or vomiting. 12/15/23   [provider]  traMADol  (ULTRAM ) 50 MG tablet Take 50 mg by mouth every 6 (six) hours as needed for moderate pain (pain score 4-6). 09/23/23 09/22/24  [provider]    Physical Exam: BP (!) 113/57   Pulse 95   Temp 98.2 F (36.8 C) (Oral)   Resp (!) 22   SpO2 95%  General:  Alert, oriented, calm, in no acute distress, patient looks tired and chronically ill.  Her cousin is at the bedside.  She is wearing 2 L nasal cannula oxygen. Cardiovascular: RRR, no murmurs or rubs, no peripheral edema  Respiratory: Severely diminished right-sided breath sounds, left lung is relatively clear without wheezing, or rhonchi.  No tachypnea, no retractions, no stridor Abdomen: soft, nontender, nondistended, normal bowel tones heard  Skin: dry, no rashes  Musculoskeletal: no joint effusions, normal range of motion  Psychiatric: appropriate affect, normal speech  Neurologic: extraocular muscles intact, clear speech, moving all extremities with intact sensorium         Labs on Admission:  Basic Metabolic Panel: Recent Labs  Lab 01/01/24 1932 01/02/24 0430 01/03/24 0419 01/05/24 0451  NA 128* 128* 130* 126*  K 4.4 3.8 3.6 3.4*  CL 96* 96* 98 93*  CO2 22 22 22  21*  GLUCOSE 101* 96 102* 124*  BUN 17 16 10 15   CREATININE 0.72 0.71 0.64 0.80  CALCIUM 9.0 8.6* 8.6* 8.9   Liver Function Tests: Recent Labs  Lab 01/01/24 1932 01/02/24 0430 01/05/24 0451  AST 46* 35 37  ALT 58* 48* 41  ALKPHOS 190* 162* 157*  BILITOT 0.5 0.5 0.5  PROT 6.8 6.2* 6.5  ALBUMIN 3.7 3.4* 3.5   Recent Labs  Lab  01/01/24 1932  LIPASE 28   No results for input(s): AMMONIA in the last 168 hours. CBC: Recent Labs  Lab 01/02/24 0430 01/02/24 1415 01/03/24 0419 01/03/24 1215 01/05/24 0451  WBC 57.1* 45.2* 54.7* 52.7* 91.6*  HGB 6.6* 7.6* 8.3* 7.7* 8.1*  HCT 20.4* 22.7* 24.3* 22.8* 24.2*  MCV 89.5 88.3 87.7 88.0 87.4  PLT 10* 28* 24* 21* 12*   Cardiac Enzymes: No results for input(s): CKTOTAL, CKMB, CKMBINDEX, TROPONINI in the last 168 hours. BNP (last 3 results) No results for input(s): BNP in the last 8760 hours.  ProBNP (last 3 results) No results for input(s): PROBNP in the last 8760 hours.  CBG: No results for input(s): GLUCAP in the last 168 hours.  Radiological Exams on  Admission: DG Chest 2 View Result Date: 01/05/2024 CLINICAL DATA:  68 year old female with chest pain.  Leukemia. EXAM: CHEST - 2 VIEW COMPARISON:  CTA chest 01/02/2024 and earlier. FINDINGS: PA and lateral views 0425 hours. Right chest Port-A-Cath is stable. Combined layering and loculated pleural effusions right greater than left with veiling opacity in the lungs. That on the right has progressed since 01/01/2024. No superimposed pneumothorax or pulmonary edema. No air bronchograms. Stable cardiac size and mediastinal contours - with mediastinal and hilar lymphadenopathy better demonstrated by CT. Visualized tracheal air column is within normal limits. No acute osseous abnormality identified. Stable cholecystectomy clips. Nonobstructed visible bowel gas pattern. IMPRESSION: Progressed appearance of Right lung combined layering and loculated pleural effusion since 01/01/2024 CT. Stable smaller left pleural effusion. Known mediastinal lymphadenopathy. Electronically Signed   By: VEAR Hurst M.D.   On: 01/05/2024 05:01   Assessment/Plan Meital Riehl Grieser is a 68 y.o. 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  AML-with recurrent anemia and thrombocytopenia.  She most recently received 1 unit PRBC and 1 unit platelets during her hospitalization 9/27.  No evidence of bleeding.  Per her oncologist at [facility], plan is to transfuse 1 unit of blood if hemoglobin is less than 7, and WBC less than 30.  To consider 1 unit PRBC transfusion if WBC greater than 30, and symptomatic.  Plan to transfuse 1 unit PRBC if hemoglobin less than 5, regardless of WBC.  Regarding thrombocytopenia, plan is to transfuse platelets for less than 20. -Inpatient admission -Transfuse 1 unit platelets, already ongoing -Check posttransfusion CBC, and transfuse as indicated -Patient would like to transfer her care locally, discussed with Dr. Lanny who will see in consultation today  Right sided pleural effusion-unclear etiology, causing her hypoxia and right-sided chest pain.  Chest x-ray appears layering, I wonder if this is a bloody effusion. -Continue supplemental oxygen -Check stat CT chest with contrast to further evaluate -May benefit from chest tube or repeat thoracentesis once platelets are in a safe range -Currently no productive cough, fever, etc. to indicate infectious etiology  Hypoxic respiratory failure-due to right-sided pleural effusion as above  Presumed gastritis-continue p.o. PPI  Hyponatremia-this is chronic, has improved in the past with IV fluids -Gentle normal saline infusion -Recheck BMP in the morning  Chronic leukocytosis-related to AML and worsening likely because she has been off her hydroxyurea .  Will address as above.  Goals of care-patient tells me she has initiated conversation with palliative care in the past, but did not get very far.  Patient and her cousin at the bedside are asking about utility of palliative care, saying they heard they can help keep the patient comfortable.  I did discuss the concept of  palliative care, as well as the meaning of hospice and its role in taking care of patients with life-limiting illness. -Based on our conversation, patient has requested DNR/DNI status. -Will consult inpatient palliative care for their assistance in discussing symptom management and potential transition to hospice when the patient feels ready  DVT prophylaxis: SCDs only due to thrombocytopenia    Code Status: Limited: Do not attempt resuscitation (DNR) -DNR-LIMITED -Do Not Intubate/DNI -extensive discussion with the patient in the presence of her cousin Tracie.  Discussed overall goals of care, as well as CODE STATUS.  Patient states she would never want to be intubated,  after further discussion also declines CPR or shocks.  Consults called: Oncology, palliative care  Admission status: The appropriate patient status for this patient is INPATIENT. Inpatient status is judged to be reasonable and necessary in order to provide the required intensity of service to ensure the patient's safety. The patient's presenting symptoms, physical exam findings, and initial radiographic and laboratory data in the context of their chronic comorbidities is felt to place them at high risk for further clinical deterioration. Furthermore, it is not anticipated that the patient will be medically stable for discharge from the hospital within 2 midnights of admission.    I certify that at the point of admission it is my clinical judgment that the patient will require inpatient hospital care spanning beyond 2 midnights from the point of admission due to high intensity of service, high risk for further deterioration and high frequency of surveillance required  Time spent: 65 minutes  Kelcey Korus CHRISTELLA Gail MD [facility] Pager [phone]  If 7PM-7AM, please contact night-coverage [email or web address] Password [email or web address]  01/05/2024, 8:39 AM

## 2024-01-05 NOTE — ED Provider Notes (Signed)
 [place] EMERGENCY DEPARTMENT AT [facility] Provider Note   CSN: [id number] Arrival date & time: 01/05/24  9597     Patient presents with: Shortness of Breath   [name] is a 68 y.o. female.  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 Pain is described as sharp, just left of sternum, with radiation into the neck. She endorses associated shortness of breath, lightheadedness. Patient also complains of nausea without vomiting.  She states that she frequently requires both platelet and blood transfusions. She gets her oncology care through [facility].  She was seen at the same hospital on September 25 by me.  At that time she had extensive imaging including a CT angio chest PE study and a CT abdomen pelvis with contrast.  She had no pulmonary embolism.  She did have a moderate right pleural effusion which was chronic and a new left pleural effusion which was small.  She was admitted due to her chest pain and thrombocytopenia.    Shortness of Breath      Prior to Admission medications   Medication Sig Start Date End Date Taking? Authorizing Provider  ALPRAZolam  (XANAX ) 0.5 MG tablet Take 0.5 mg by mouth 2 (two) times daily.    [provider]  ascorbic acid  (VITAMIN C ) 500 MG tablet Take 500 mg by mouth daily.    [provider]  b complex vitamins capsule Take 1 capsule by mouth daily.    [provider]  cholecalciferol  (VITAMIN D3) 10 MCG (400 UNIT) TABS tablet Take 400 Units by mouth daily.    [provider]  furosemide (LASIX) 40 MG tablet Take 40 mg by mouth daily. 12/19/23 12/26/23  [provider]  hydroxyurea  (HYDREA ) 500 MG capsule Take 1,000 mg by mouth in the morning, at noon, and at bedtime.    [provider]  hydrOXYzine  (ATARAX ) 25  MG tablet Take 25 mg by mouth 3 (three) times daily as needed for anxiety. 06/09/23   [provider]  nicotine  (NICODERM CQ  - DOSED IN MG/24 HOURS) 21 mg/24hr patch Place 1 patch (21 mg total) onto the skin daily. 07/08/23   Swayze, Ava, DO  ondansetron  (ZOFRAN -ODT) 8 MG disintegrating tablet Take 8 mg by mouth every 8 (eight) hours as needed for nausea or vomiting. 12/15/23 12/14/24  [provider]  pantoprazole  (PROTONIX ) 20 MG tablet TAKE ONE TABLET (20 MG TOTAL) BY MOUTH DAILY. 07/23/23   Rao, Archana C, MD  polyethylene glycol (MIRALAX  / GLYCOLAX ) 17 g packet Take 17 g by mouth daily as needed for mild constipation.    [provider]  prochlorperazine  (COMPAZINE ) 10 MG tablet Take 10 mg by mouth daily as needed for nausea or vomiting. 12/16/23   [provider]  promethazine  (PHENERGAN ) 25 MG tablet Take 25 mg by mouth daily as needed for nausea or vomiting. 12/15/23   [provider]  traMADol  (ULTRAM ) 50 MG tablet Take 50 mg by mouth every 6 (six) hours as needed for moderate pain (pain score 4-6). 09/23/23 09/22/24  [provider]    Allergies: Penicillin g, Prednisone, Codeine, and Heparin     Review of Systems  Respiratory:  Positive for shortness of breath.     Updated Vital Signs BP (!) 115/59   Pulse (!) 102   Temp 98.5 F (36.9 C) (Oral)   Resp (!) 21  SpO2 96%   Physical Exam Vitals and nursing note reviewed.  Constitutional:      General: She is not in acute distress.    Appearance: She is well-developed.  HENT:     Head: Normocephalic and atraumatic.  Eyes:     Conjunctiva/sclera: Conjunctivae normal.  Cardiovascular:     Rate and Rhythm: Normal rate and regular rhythm.  Pulmonary:     Effort: Pulmonary effort is normal. No respiratory distress.     Breath sounds: Normal breath sounds.  Chest:     Chest wall: No tenderness.  Abdominal:     Palpations: Abdomen is soft.     Tenderness: There is no abdominal  tenderness.     Comments: Generalized upper abdominal tenderness  Musculoskeletal:        General: No swelling.     Cervical back: Neck supple.     Right lower leg: No edema.     Left lower leg: No edema.     Comments: Palpable nodule lower left leg on lateral side with no fluctuance, overlying erythema, induration.  Palpable knot on left forearm that patient states is superficial phlebitis from recent IV stick  Skin:    General: Skin is warm and dry.  Neurological:     Mental Status: She is alert.  Psychiatric:        Mood and Affect: Mood normal.     (all labs ordered are listed, but only abnormal results are displayed) Labs Reviewed  CBC - Abnormal; Notable for the following components:      Result Value   WBC 91.6 (*)    RBC 2.77 (*)    Hemoglobin 8.1 (*)    HCT 24.2 (*)    RDW 15.6 (*)    Platelets 12 (*)    All other components within normal limits  RESP PANEL BY RT-PCR (RSV, FLU A&B, COVID)  RVPGX2  BASIC METABOLIC PANEL WITH GFR  TYPE AND SCREEN  PREPARE PLATELET PHERESIS  TROPONIN T, HIGH SENSITIVITY  TROPONIN T, HIGH SENSITIVITY    EKG: EKG Interpretation Date/Time:  Monday January 05 2024 04:38:26 EDT Ventricular Rate:  108 PR Interval:  153 QRS Duration:  90 QT Interval:  315 QTC Calculation: 423 R Axis:   -65  Text Interpretation: Sinus tachycardia Left anterior fascicular block Borderline T abnormalities, anterior leads Confirmed by Griselda Norris [phone]) on 01/05/2024 4:43:02 AM  Radiology: [facility] Chest 2 View Result Date: 01/05/2024 CLINICAL DATA:  68 year old female with chest pain.  Leukemia. EXAM: CHEST - 2 VIEW COMPARISON:  CTA chest 01/02/2024 and earlier. FINDINGS: PA and lateral views 0425 hours. Right chest Port-A-Cath is stable. Combined layering and loculated pleural effusions right greater than left with veiling opacity in the lungs. That on the right has progressed since 01/01/2024. No superimposed pneumothorax or pulmonary edema. No air  bronchograms. Stable cardiac size and mediastinal contours - with mediastinal and hilar lymphadenopathy better demonstrated by CT. Visualized tracheal air column is within normal limits. No acute osseous abnormality identified. Stable cholecystectomy clips. Nonobstructed visible bowel gas pattern. IMPRESSION: Progressed appearance of Right lung combined layering and loculated pleural effusion since 01/01/2024 CT. Stable smaller left pleural effusion. Known mediastinal lymphadenopathy. Electronically Signed   By: VEAR Hurst M.D.   On: 01/05/2024 05:01     .Critical Care  Performed by: Logan Ubaldo NOVAK, PA-C Authorized by: Logan Ubaldo NOVAK DEVONNA   Critical care provider statement:    Critical care time (minutes):  30   Critical care time  was exclusive of:  Separately billable procedures and treating other patients   Critical care was necessary to treat or prevent imminent or life-threatening deterioration of the following conditions:  Circulatory failure and respiratory failure (Thrombocytopenia requiring platelets, new oxygen requirement)   Critical care was time spent personally by me on the following activities:  Development of treatment plan with patient or surrogate, discussions with consultants, evaluation of patient's response to treatment, examination of patient, ordering and review of laboratory studies, ordering and review of radiographic studies, ordering and performing treatments and interventions, pulse oximetry, re-evaluation of patient's condition and review of old charts    Medications Ordered in the ED  0.9 %  sodium chloride  infusion (Manually program via Guardrails IV Fluids) (has no administration in time range)  morphine  (PF) 4 MG/ML injection 4 mg (4 mg Intravenous Given 01/05/24 0452)  ondansetron  (ZOFRAN ) injection 4 mg (4 mg Intravenous Given 01/05/24 0451)  morphine  (PF) 2 MG/ML injection 2 mg (2 mg Intravenous Given 01/05/24 0551)                                     Medical Decision Making Amount and/or Complexity of Data Reviewed Labs: ordered. Radiology: ordered.  Risk Prescription drug management.   This patient presents to the ED for concern of chest pain and shortness of breath, this involves an extensive number of treatment options, and is a complaint that carries with it a high risk of complications and morbidity.  The differential diagnosis includes thrombocytopenia, symptomatic anemia, pneumonia, viral infection, ACS, others   Co morbidities / Chronic conditions that complicate the patient evaluation  AML   Additional history obtained:  Additional history obtained from EMR External records from outside source obtained and reviewed including recent discharge summary   Lab Tests:  I Ordered, and personally interpreted labs.  The pertinent results include: White count 91,600, platelets 12,000, hemoglobin 8.1   Imaging Studies ordered:  I ordered imaging studies including chest x-ray I independently visualized and interpreted imaging which showed  Progressed appearance of Right lung combined layering and loculated  pleural effusion since 01/01/2024 CT.    Stable smaller left pleural effusion.   I agree with the radiologist interpretation   Cardiac Monitoring: / EKG:  The patient was maintained on a cardiac monitor.  I personally viewed and interpreted the cardiac monitored which showed an underlying rhythm of: Sinus tachycardia   Problem List / ED Course / Critical interventions / Medication management   I ordered medication including morphine , Zofran , 1 unit platelets Reevaluation of the patient after these medicines showed that the patient improved I have reviewed the patients home medicines and have made adjustments as needed   Social Determinants of Health:  Patient is a daily smoker   Test / Admission - Considered:  Patient with profound thrombocytopenia requiring a platelet transfusion.  She is short of  breath and requiring oxygen, 2 L/min, which is new.  She was just discharged a few days ago and has worsened.  She also has what appears to be a worsening right-sided pleural effusion.  Patient needs admission for continued management. Patient care remaining with Dr.Rees at the end of shift.       Final diagnoses:  Thrombocytopenia  Respiratory failure, unspecified chronicity, unspecified whether with hypoxia or hypercapnia (HCC)  Pleural effusion    ED Discharge Orders     None  Logan Ubaldo KATHEE DEVONNA 01/05/24 9372    Griselda Norris, MD 01/06/24 [phone]

## 2024-01-05 NOTE — Procedures (Signed)
 Thoracentesis  Procedure Note  [name]  [id number]  [birth year]  Date:01/05/24  Time:3:10 PM   Provider Performing:Mry Lamia H Ruben Mahler   Procedure: Thoracentesis with imaging guidance ([id number])  Indication(s) Pleural Effusion  Consent Risks of the procedure as well as the alternatives and risks of each were explained to the patient and/or caregiver.  Consent for the procedure was obtained and is signed in the bedside chart  Anesthesia Topical only with 1% lidocaine     Time Out Verified patient identification, verified procedure, site/side was marked, verified correct patient position, special equipment/implants available, medications/allergies/relevant history reviewed, required imaging and test results available.   Sterile Technique Maximal sterile technique including full sterile barrier drape, hand hygiene, sterile gown, sterile gloves, mask, hair covering, sterile ultrasound probe cover (if used).  Procedure Description Ultrasound was used to identify appropriate pleural anatomy for placement and overlying skin marked.  Area of drainage cleaned and draped in sterile fashion. Lidocaine  was used to anesthetize the skin and subcutaneous tissue. 500 cc's of serosanguinous appearing fluid was drained from the right pleural space. Catheter then removed and bandaid applied to site.   Complications/Tolerance None; patient tolerated the procedure well. Chest X-ray is ordered to confirm no post-procedural complication.   EBL Minimal   Specimen(s) Pleural fluid  Warren Shade, DNP, AGACNP-BC [facility]  Please see [email or web address] for pager details.  From 7A-7P if no response, please call [phone]. After hours, please call ELink [phone].

## 2024-01-05 NOTE — ED Notes (Signed)
 Port accessed

## 2024-01-05 NOTE — ED Triage Notes (Signed)
 Pt reports that she has been having CP and SOB since yesterday, coughing up white phlegm, denies fevers. Pt 88% on RA, placed on 2L in triage

## 2024-01-05 NOTE — Progress Notes (Addendum)
 Pharmacy Antibiotic Note  [name] is a 68 y.o. female admitted on 01/05/2024 with concern for pneumonia.  Pharmacy has been consulted for vancomycin  and cefepime  dosing.  Plan: Vancomycin  1000 mg IV now, then 1250 mg IV q24 hr (est AUC 487 based on SCr 0.8; Vd 0.72) Measure vancomycin  AUC at steady state as indicated SCr q48 while on vanc MRSA PCR ordered; f/u and narrow vanc as appropriate Cefepime  2 g IV q12 hr; further doses per standing pharmacy protocol    Temp (24hrs), Avg:98.6 F (37 C), Min:98.2 F (36.8 C), Max:99.2 F (37.3 C)  Recent Labs  Lab 01/01/24 1932 01/02/24 0430 01/02/24 1415 01/03/24 0419 01/03/24 1215 01/05/24 0451  WBC 72.0* 57.1* 45.2* 54.7* 52.7* 91.6*  CREATININE 0.72 0.71  --  0.64  --  0.80    Estimated Creatinine Clearance: 58.9 mL/min (by C-G formula based on SCr of 0.8 mg/dL).    Allergies  Allergen Reactions   Penicillin G Anaphylaxis    Has tolerated Rocephin  and Cefepime  on multiple occasions   Prednisone Anaphylaxis    Pt states it turned her into a monster, lips started swelling and couldn't breath   Codeine Nausea And Vomiting   Heparin  Other (See Comments)    Passed out     Antimicrobials this admission: 9/29 vancomycin  >>  9/29 cefepime  >>   Dose adjustments this admission: N/a  Microbiology results: 9/29 Resp panel: sent 9/29 MRSA PCR: ordered  Thank you for allowing pharmacy to be a part of this patient's care.  Errica Dutil A 01/05/2024 1:54 PM

## 2024-01-05 NOTE — Consult Note (Signed)
 Consultation Note Date: 01/05/2024   Patient Name: [name]  DOB: 11/05/1955  MRN: [id number]  Age / Sex: 68 y.o., female  PCP: Melanee Annah BROCKS, MD Referring Physician: Zella Katha HERO, MD  Reason for Consultation: Establishing goals of care  HPI/Patient Profile: 68 y.o. 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 Chandra) admitted on 01/05/2024 with shortness of breath and right-sided chest pain found to have recurrent thrombocytopenia and loculated right-sided pleural effusion. Recurrent admissions requiring blood and platelet transfusions and s/p right-sided thoracentesis 770 mL of bloody fluid removed. Oncology consulted for consideration of transfer of care locally.   Clinical Assessment and Goals of Care: Consult received and chart review completed. I met today at [name]'s bedside along with her cousin, [name], who is her main support. [name] has good understanding that her cancer is worsening and her blood counts are becoming more and more unstable. She is receiving frequent platelet infusions and now with recurrent pleural effusion. We reviewed her symptoms and I adjusted her medications to better manage her symptoms and reflect what she takes at home for nausea (requiring ondansetron , Compazine , and phenergan  to control), anxiety. We reviewed pain and shortness of breath and she had relief with morphine  earlier. She shares that oxycodone  makes her very nauseated so d/c and will utilize morphine  for now as she tolerated this okay so far.   We spent time discussing her goals of care and options for support. She would like to stay at home as long as possible. She lives alone but [name] and 3 other family/friends are planning to be there more frequently to help and support her. We discussed in detail outpatient palliative care which I feel will be of limited  benefit to her given the progression of her disease and anticipated needs. We discussed in detail hospice services. We reviewed the benefits of hospice to manage symptoms to avoid further hospitalization, appointments and infusions. She is considering hospice support to help her stay at home as long as possible and she would be open to hospice facility when needed. She shared that her husband was in hospice and she had a good experience with him as well as other family members in hospice as well. She has also lost her only child. She has a granddaughter, [name], but [name] has a baby only 59 months old and [name] does not want to burden her with care while she is adjusting to being a new mother.   [name] would like input from oncology and is anticipating a visit from Dr. Lanny today. She would like input and recommendations from Dr. Lanny before making any final decisions regarding hospice support. She would like to also proceed with thoracentesis and continue to optimize her health prior to d/c. I provided them both with my contact information and will follow up with them regarding their decisions tomorrow - they know to call for any further needs in the meantime.   All questions/concerns addressed. Emotional support provided.  Updated Dr. Zella and Dr. Lanny. Discussed with Olam Brunner NP oncology. Discussed with PCCM as well.    Primary Decision Maker PATIENT    SUMMARY OF RECOMMENDATIONS   - DNR/DNI confirmed - Considering home with hospice support - Cousin [name] at bedside is very supportive  Code Status/Advance Care Planning: DNR   Symptom Management:  Nausea: Zofran  q6h PRN and then Compazine  q6h PRN and then Phenergan  q8h PRN if previous is ineffective (she typically requires Phenergan  ~1x daily) Pain/shortness of breath:  Morphine  2-4 mg IV q3h PRN (she tolerated well earlier; can have roxanol 5-10 mg q3 PRN for home use if needed Oxycodone  makes her nauseated and she  cannot tolerate Anxiety:  Continue Xanax  0.5 mg BID per home dose PRN hydroxyzine  although she says she has not used this at home Bowel Regimen:  Senokot 1 tab daily PRN - may schedule if needed.   Prognosis:  Overall prognosis poor.   Discharge Planning: Considering home with hospice support.       Primary Diagnoses: Present on Admission:  Thrombocytopenia   I have reviewed the medical record, interviewed the patient and family, and examined the patient. The following aspects are pertinent.  Past Medical History:  Diagnosis Date   GERD (gastroesophageal reflux disease)    Leukemia (HCC)    Social History   Socioeconomic History   Marital status: Widowed    Spouse name: Not on file   Number of children: Not on file   Years of education: Not on file   Highest education level: Not on file  Occupational History   Not on file  Tobacco Use   Smoking status: Every Day    Current packs/day: 0.25    Average packs/day: 0.3 packs/day for 4.0 years (1.0 ttl pk-yrs)    Types: Cigarettes   Smokeless tobacco: Never  Vaping Use   Vaping status: Never Used  Substance and Sexual Activity   Alcohol use: Not Currently   Drug use: Not Currently   Sexual activity: Not Currently  Other Topics Concern   Not on file  Social History Narrative   Not on file   Social Drivers of Health   Financial Resource Strain: Low Risk  (06/05/2023)   Received from [facility]   Overall Financial Resource Strain (CARDIA)    Difficulty of Paying Living Expenses: Not very hard  Food Insecurity: No Food Insecurity (01/03/2024)   Hunger Vital Sign    Worried About Running Out of Food in the Last Year: Never true    Ran Out of Food in the Last Year: Never true  Transportation Needs: No Transportation Needs (01/03/2024)   PRAPARE - Administrator, Civil Service (Medical): No    Lack of Transportation (Non-Medical): No  Physical Activity: Insufficiently Active (03/22/2021)   Exercise  Vital Sign    Days of Exercise per Week: 2 days    Minutes of Exercise per Session: 10 min  Stress: No Stress Concern Present (03/22/2021)   [employer] of Occupational Health - Occupational Stress Questionnaire    Feeling of Stress : Not at all  Social Connections: Socially Isolated (01/02/2024)   Social Connection and Isolation Panel    Frequency of Communication with Friends and Family: Three times a week    Frequency of Social Gatherings with Friends and Family: Once a week    Attends Religious Services: Never    Database administrator or Organizations: No    Attends Banker Meetings: Never  Marital Status: Widowed   Family History  Problem Relation Age of Onset   Cancer Mother    Heart failure Father    Heart failure Sister    Cancer Maternal Grandmother    Cancer Maternal Grandfather    Scheduled Meds:  ALPRAZolam   0.5 mg Oral BID   pantoprazole   20 mg Oral Daily   Continuous Infusions:  sodium chloride      PRN Meds:.acetaminophen  **OR** acetaminophen , albuterol , hydrOXYzine , iohexol , morphine  injection, ondansetron  **OR** ondansetron  (ZOFRAN ) IV, oxyCODONE , traZODone  Allergies  Allergen Reactions   Penicillin G Anaphylaxis   Prednisone Anaphylaxis    Pt states it turned her into a monster, lips started swelling and couldn't breath   Codeine Nausea And Vomiting   Heparin  Other (See Comments)    Passed out    Review of Systems  Constitutional:  Positive for activity change and fatigue.  Respiratory:  Positive for cough and shortness of breath.     Physical Exam Vitals and nursing note reviewed.  Constitutional:      General: She is not in acute distress.    Appearance: She is ill-appearing.  Cardiovascular:     Rate and Rhythm: Normal rate.  Pulmonary:     Effort: No tachypnea, accessory muscle usage or respiratory distress.     Comments: Dyspnea with activity; fatigued after discussion Abdominal:     Palpations: Abdomen is soft.   Neurological:     Mental Status: She is alert and oriented to person, place, and time.     Vital Signs: BP (!) 113/57   Pulse 95   Temp 98.2 F (36.8 C) (Oral)   Resp (!) 22   SpO2 95%  Pain Scale: 0-10   Pain Score: 3    SpO2: SpO2: 95 % O2 Device:SpO2: 95 % O2 Flow Rate: .O2 Flow Rate (L/min): 2 L/min  IO: Intake/output summary: No intake or output data in the 24 hours ending 01/05/24 0956  LBM:   Baseline Weight:   Most recent weight:       Palliative Assessment/Data:     Time Total: 85 min  Greater than 50%  of this time was spent counseling and coordinating care related to the above assessment and plan.  Signed by: Bernarda Kitty, NP Palliative Medicine Team Pager # [phone] (M-F 8a-5p) Team Phone # [phone] (Nights/Weekends)

## 2024-01-05 NOTE — ED Notes (Signed)
 Patient transported to X-ray

## 2024-01-06 ENCOUNTER — Inpatient Hospital Stay (HOSPITAL_COMMUNITY)

## 2024-01-06 DIAGNOSIS — J9601 Acute respiratory failure with hypoxia: Secondary | ICD-10-CM

## 2024-01-06 DIAGNOSIS — D696 Thrombocytopenia, unspecified: Secondary | ICD-10-CM | POA: Diagnosis not present

## 2024-01-06 LAB — CBC WITH DIFFERENTIAL/PLATELET
Abs Immature Granulocytes: 2.8 K/uL — ABNORMAL HIGH (ref 0.00–0.07)
Band Neutrophils: 4 %
Basophils Absolute: 0 K/uL (ref 0.0–0.1)
Basophils Relative: 0 %
Blasts: 62 %
Eosinophils Absolute: 0 K/uL (ref 0.0–0.5)
Eosinophils Relative: 0 %
HCT: 24.4 % — ABNORMAL LOW (ref 36.0–46.0)
Hemoglobin: 8.4 g/dL — ABNORMAL LOW (ref 12.0–15.0)
Lymphocytes Relative: 16 %
Lymphs Abs: 11.1 K/uL — ABNORMAL HIGH (ref 0.7–4.0)
MCH: 29.4 pg (ref 26.0–34.0)
MCHC: 34.4 g/dL (ref 30.0–36.0)
MCV: 85.3 fL (ref 80.0–100.0)
Monocytes Absolute: 3.5 K/uL — ABNORMAL HIGH (ref 0.1–1.0)
Monocytes Relative: 5 %
Myelocytes: 2 %
Neutro Abs: 9 K/uL — ABNORMAL HIGH (ref 1.7–7.7)
Neutrophils Relative %: 9 %
Platelets: 22 K/uL — CL (ref 150–400)
Promyelocytes Relative: 2 %
RBC: 2.86 MIL/uL — ABNORMAL LOW (ref 3.87–5.11)
RDW: 16.8 % — ABNORMAL HIGH (ref 11.5–15.5)
WBC: 69.4 K/uL (ref 4.0–10.5)
nRBC: 0 % (ref 0.0–0.2)

## 2024-01-06 LAB — BASIC METABOLIC PANEL WITH GFR
Anion gap: 11 (ref 5–15)
BUN: 18 mg/dL (ref 8–23)
CO2: 23 mmol/L (ref 22–32)
Calcium: 8.7 mg/dL — ABNORMAL LOW (ref 8.9–10.3)
Chloride: 94 mmol/L — ABNORMAL LOW (ref 98–111)
Creatinine, Ser: 0.9 mg/dL (ref 0.44–1.00)
GFR, Estimated: >60 mL/min (ref >60)
Glucose, Bld: 104 mg/dL — ABNORMAL HIGH (ref 70–99)
Potassium: 3.5 mmol/L (ref 3.5–5.1)
Sodium: 128 mmol/L — ABNORMAL LOW (ref 135–145)

## 2024-01-06 LAB — BPAM PLATELET PHERESIS
Blood Product Expiration Date: 202509292359
Blood Product Expiration Date: 202510022359
ISSUE DATE / TIME: 202509290633
ISSUE DATE / TIME: 202509291400
Unit Type and Rh: 6200
Unit Type and Rh: 7300

## 2024-01-06 LAB — PREPARE PLATELET PHERESIS
Unit division: 0
Unit division: 0

## 2024-01-06 LAB — PREPARE RBC (CROSSMATCH)

## 2024-01-06 LAB — CBC
HCT: 24.2 % — ABNORMAL LOW (ref 36.0–46.0)
HCT: 20.7 % — ABNORMAL LOW (ref 36.0–46.0)
Hemoglobin: 8.1 g/dL — ABNORMAL LOW (ref 12.0–15.0)
Hemoglobin: 6.9 g/dL — CL (ref 12.0–15.0)
MCH: 29.2 pg (ref 26.0–34.0)
MCH: 29.5 pg (ref 26.0–34.0)
MCHC: 33.5 g/dL (ref 30.0–36.0)
MCHC: 33.3 g/dL (ref 30.0–36.0)
MCV: 87.4 fL (ref 80.0–100.0)
MCV: 88.5 fL (ref 80.0–100.0)
Platelets: 12 K/uL — CL (ref 150–400)
Platelets: 29 K/uL — CL (ref 150–400)
RBC: 2.77 MIL/uL — ABNORMAL LOW (ref 3.87–5.11)
RBC: 2.34 MIL/uL — ABNORMAL LOW (ref 3.87–5.11)
RDW: 15.6 % — ABNORMAL HIGH (ref 11.5–15.5)
RDW: 15.9 % — ABNORMAL HIGH (ref 11.5–15.5)
WBC: 91.6 K/uL (ref 4.0–10.5)
WBC: 87.2 K/uL (ref 4.0–10.5)
nRBC: 0.1 % (ref 0.0–0.2)
nRBC: 0 % (ref 0.0–0.2)

## 2024-01-06 LAB — LACTATE DEHYDROGENASE: LDH: 1113 U/L — ABNORMAL HIGH (ref 98–192)

## 2024-01-06 LAB — PROTEIN, TOTAL: Total Protein: 5.8 g/dL — ABNORMAL LOW (ref 6.5–8.1)

## 2024-01-06 MED ORDER — NICOTINE 21 MG/24HR TD PT24
21.0000 mg | MEDICATED_PATCH | Freq: Every day | TRANSDERMAL | Status: DC
  Administered 2024-01-06 – 2024-01-08 (×3): 21 mg via TRANSDERMAL
  Filled 2024-01-06 (×3): qty 1

## 2024-01-06 MED ORDER — SODIUM CHLORIDE 0.9% FLUSH
10.0000 mL | INTRAVENOUS | Status: DC | PRN
  Administered 2024-01-08: 20 mL

## 2024-01-06 MED ORDER — CHLORHEXIDINE GLUCONATE CLOTH 2 % EX PADS
6.0000 | MEDICATED_PAD | Freq: Every day | CUTANEOUS | Status: DC
  Administered 2024-01-06 – 2024-01-08 (×3): 6 via TOPICAL

## 2024-01-06 MED ORDER — HYDROXYUREA 500 MG PO CAPS: 1000.0000 mg | ORAL_CAPSULE | Freq: Four times a day (QID) | ORAL | Status: DC

## 2024-01-06 MED ORDER — FUROSEMIDE 10 MG/ML IJ SOLN
20.0000 mg | Freq: Once | INTRAMUSCULAR | Status: AC
  Administered 2024-01-06: 20 mg via INTRAVENOUS
  Filled 2024-01-06: qty 2

## 2024-01-06 MED ORDER — CHOLECALCIFEROL 10 MCG (400 UNIT) PO TABS
400.0000 [IU] | ORAL_TABLET | Freq: Every day | ORAL | Status: DC
  Administered 2024-01-06 – 2024-01-08 (×3): 400 [IU] via ORAL
  Filled 2024-01-06 (×3): qty 1

## 2024-01-06 MED ORDER — SODIUM CHLORIDE 0.9% IV SOLUTION: Freq: Once | INTRAVENOUS | Status: AC

## 2024-01-06 MED ORDER — TRAMADOL HCL 50 MG PO TABS
50.0000 mg | ORAL_TABLET | Freq: Four times a day (QID) | ORAL | Status: DC | PRN
  Administered 2024-01-08: 50 mg via ORAL
  Filled 2024-01-06 (×3): qty 1

## 2024-01-06 MED ORDER — VITAMIN C 500 MG PO TABS
500.0000 mg | ORAL_TABLET | Freq: Every day | ORAL | Status: DC
  Administered 2024-01-06 – 2024-01-08 (×3): 500 mg via ORAL
  Filled 2024-01-06 (×3): qty 1

## 2024-01-06 MED ORDER — DIPHENHYDRAMINE HCL 25 MG PO CAPS
25.0000 mg | ORAL_CAPSULE | Freq: Once | ORAL | Status: AC
  Administered 2024-01-06: 25 mg via ORAL
  Filled 2024-01-06: qty 1

## 2024-01-06 MED ORDER — SODIUM CHLORIDE 3 % IN NEBU
4.0000 mL | INHALATION_SOLUTION | Freq: Two times a day (BID) | RESPIRATORY_TRACT | Status: DC
Start: 2024-01-06 — End: 2024-01-09
  Administered 2024-01-07 – 2024-01-08 (×3): 4 mL via RESPIRATORY_TRACT
  Filled 2024-01-06 (×7): qty 4

## 2024-01-06 MED ORDER — SODIUM CHLORIDE 0.9% FLUSH
10.0000 mL | Freq: Two times a day (BID) | INTRAVENOUS | Status: DC
  Administered 2024-01-07 – 2024-01-08 (×2): 10 mL

## 2024-01-06 MED ORDER — ORAL CARE MOUTH RINSE: 15.0000 mL | OROMUCOSAL | Status: DC | PRN

## 2024-01-06 MED ORDER — GUAIFENESIN ER 600 MG PO TB12
600.0000 mg | ORAL_TABLET | Freq: Two times a day (BID) | ORAL | Status: DC
  Administered 2024-01-06 – 2024-01-08 (×5): 600 mg via ORAL
  Filled 2024-01-06 (×5): qty 1

## 2024-01-06 MED ORDER — ACETAMINOPHEN 325 MG PO TABS
650.0000 mg | ORAL_TABLET | Freq: Once | ORAL | Status: AC
  Administered 2024-01-06: 650 mg via ORAL
  Filled 2024-01-06: qty 2

## 2024-01-06 MED ORDER — GUAIFENESIN-DM 100-10 MG/5ML PO SYRP
5.0000 mL | ORAL_SOLUTION | ORAL | Status: DC | PRN
  Administered 2024-01-06 – 2024-01-08 (×4): 5 mL via ORAL
  Filled 2024-01-06 (×4): qty 10

## 2024-01-06 NOTE — TOC Initial Note (Signed)
 Transition of Care [facility]) - Initial/Assessment Note   Patient Details  Name: [name] MRN: [id number] Date of Birth: [birth year]  Transition of Care [facility]) CM/SW Contact:    Duwaine GORMAN Aran, LCSW Phone Number: 01/06/2024, 12:19 PM  Clinical Narrative: CSW notified patient is requesting to be set up with [facility]. CSW spoke with patient and patient requested palliative care at this time. Referral made to [facility] with [facility]. Care management to follow.  Expected Discharge Plan: Home/Self Care Barriers to Discharge: Continued Medical Work up  Patient Goals and CMS Choice Patient states their goals for this hospitalization and ongoing recovery are:: Home with palliative through [facility] CMS Medicare.gov Compare Post Acute Care list provided to:: Patient Choice offered to / list presented to : Patient  Expected Discharge Plan and Services In-house Referral: Clinical Social Work Post Acute Care Choice:  (Palliative care) Living arrangements for the past 2 months: Single Family Home           DME Arranged: N/A DME Agency: NA  Prior Living Arrangements/Services Living arrangements for the past 2 months: Single Family Home Patient language and need for interpreter reviewed:: Yes Do you feel safe going back to the place where you live?: Yes      Need for Family Participation in Patient Care: No (Comment) Care giver support system in place?: Yes (comment) Criminal Activity/Legal Involvement Pertinent to Current Situation/Hospitalization: No - Comment as needed  Activities of Daily Living ADL Screening (condition at time of admission) Independently performs ADLs?: Yes (appropriate for developmental age) Is the patient deaf or have difficulty hearing?: No Does the patient have difficulty seeing, even when wearing glasses/contacts?: No Does the patient have difficulty concentrating, remembering, or making decisions?: No  Permission Sought/Granted Permission sought to share  information with : Other (comment) Permission granted to share information with : Yes, Verbal Permission Granted Permission granted to share info w AGENCY: [facility]  Emotional Assessment Attitude/Demeanor/Rapport: Engaged Affect (typically observed): Appropriate Orientation: : Oriented to Self, Oriented to Place, Oriented to  Time, Oriented to Situation Alcohol / Substance Use: Not Applicable Psych Involvement: No (comment)  Admission diagnosis:  Pleural effusion [J90] Thrombocytopenia [D69.6] Respiratory failure, unspecified chronicity, unspecified whether with hypoxia or hypercapnia (HCC) [J96.90] Patient Active Problem List   Diagnosis Date Noted   Acute leukemia (HCC) 12/21/2023   Pleural effusion 09/24/2023   Thrombocytopenia 09/24/2023   Chronic hyponatremia 09/24/2023   Colitis 07/05/2023   Multifocal pneumonia 07/04/2023   Pancytopenia (HCC) 07/04/2023   Gout 07/04/2023   Acute myeloid leukemia not having achieved remission (HCC) 03/10/2023   Anemia 03/04/2023   Aortic atherosclerosis 03/04/2023   PCP:  Melanee Annah BROCKS, MD Pharmacy:   [facility] [phone] [address], [place] - [address] [place] [address] Phone: [phone] Fax: [phone]  [facility] - [place], [place] - [address] [place] [address] Phone: [phone] Fax: [phone]  Social Drivers of Health (SDOH) Social History: SDOH Screenings   Food Insecurity: No Food Insecurity (01/05/2024)  Housing: Low Risk  (01/05/2024)  Transportation Needs: No Transportation Needs (01/05/2024)  Utilities: Not At Risk (01/05/2024)  Alcohol Screen: Low Risk  (03/22/2021)  Depression (PHQ2-9): Low Risk  (05/06/2023)  Financial Resource Strain: Low Risk  (06/05/2023)   Received from [facility]  Physical Activity: Insufficiently Active (03/22/2021)  Social Connections: Socially Isolated (01/05/2024)  Stress: No Stress Concern Present (03/22/2021)   Tobacco Use: High Risk (01/05/2024)  Health Literacy: Low Risk  (03/13/2023)  Received from [facility]   SDOH Interventions:    Readmission Risk Interventions    01/06/2024   12:09 PM 01/03/2024    2:46 PM 12/22/2023    8:33 AM  Readmission Risk Prevention Plan  Transportation Screening Complete Complete Complete  PCP or Specialist Appt within 3-5 Days  Complete   HRI or Home Care Consult  Complete Complete  Social Work Consult for Recovery Care Planning/Counseling  Complete Complete  Palliative Care Screening  Not Applicable Not Applicable  Medication Review Oceanographer) Complete Complete Complete  HRI or Home Care Consult Complete    SW Recovery Care/Counseling Consult Complete    Palliative Care Screening Complete    Skilled Nursing Facility Not Applicable

## 2024-01-06 NOTE — Progress Notes (Addendum)
 Daily Progress Note   Patient Name: [name]       Date: 01/06/2024 DOB: 1955/05/28  Age: 68 y.o. MRN#: [id number] Attending Physician: Sebastian Toribio GAILS, MD Primary Care Physician: Melanee Annah BROCKS, MD Admit Date: 01/05/2024  Reason for Consultation/Follow-up: Establishing goals of care  Patient Profile/HPI: 68 y.o. 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 Chandra) admitted on 01/05/2024 with shortness of breath and right-sided chest pain found to have recurrent thrombocytopenia and loculated right-sided pleural effusion. Recurrent admissions requiring blood and platelet transfusions and s/p right-sided thoracentesis 770 mL of bloody fluid removed. Oncology consulted for consideration of transfer of care locally.     Subjective: Chart reviewed including labs, progress notes, imaging from this and previous encounters.  Pulmonology note reviewed- noted patient likely has malignant effusion that will continue to accumulate.  Labs reviewed- WBC 87.2 today, hgb 6.9- plan for transfusion. Platelets 29.  Met with [name] and Delon at the bedside. Dr. Lanny was present prior to my discussion. Per Dr. Lanny- plan is for patient to stop hydroxyurea  and to continue transfusions at the [name].  I reviewed goals of care with patient. She wishes to continue life prolonging transfusions for now. I answered her questions regarding the difference between hospice and palliative medicine.  She is not quite ready for hospice as she does wish to continue transfusions to prolong her time.  We discussed her malignant pleural effusions and that she will need more thoracentesis in the future. We also discussed pleurex catheter. She wishes to continue with  thoracenteses if needed.  She notes that her goals of care are to remain as independent as possible for as long as possible. She is aware that hospice is an option when transfusions stop working or become too difficult for her to obtain.   Review of Systems  Constitutional:  Positive for malaise/fatigue.     Physical Exam Vitals and nursing note reviewed.  Constitutional:      General: She is not in acute distress. Cardiovascular:     Rate and Rhythm: Normal rate.  Pulmonary:     Effort: Pulmonary effort is normal.  Neurological:     Mental Status: She is alert and oriented to person, place, and time.     Comments: Some forgetfulness  Vital Signs: BP 103/60 (BP Location: Left Arm)   Pulse 84   Temp (!) 97.5 F (36.4 C) (Oral)   Resp 18   Ht 5' 5 (1.651 m)   Wt 67 kg   SpO2 96%   BMI 24.58 kg/m  SpO2: SpO2: 96 % O2 Device: O2 Device: Nasal Cannula O2 Flow Rate: O2 Flow Rate (L/min): 2 L/min  Intake/output summary:  Intake/Output Summary (Last 24 hours) at 01/06/2024 1606 Last data filed at 01/06/2024 1131 Gross per 24 hour  Intake 1466.38 ml  Output --  Net 1466.38 ml   LBM: Last BM Date : 01/04/24 Baseline Weight: Weight: 67 kg Most recent weight: Weight: 67 kg       Palliative Assessment/Data: PPS: 50%      Patient Active Problem List   Diagnosis Date Noted   Acute leukemia (HCC) 12/21/2023   Pleural effusion 09/24/2023   Thrombocytopenia 09/24/2023   Chronic hyponatremia 09/24/2023   Colitis 07/05/2023   Multifocal pneumonia 07/04/2023   Pancytopenia (HCC) 07/04/2023   Gout 07/04/2023   Acute myeloid leukemia not having achieved remission (HCC) 03/10/2023   Anemia 03/04/2023   Aortic atherosclerosis 03/04/2023    Palliative Care Assessment & Plan    Assessment/Recommendations/Plan  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 Lanny, patient's family  Thank you for allowing the Palliative Medicine Team to assist in the care of this patient.  Total time:  Prolonged billing:  Time includes:   Preparing to see the patient (e.g., review of tests) Obtaining and/or reviewing separately obtained history Performing a medically necessary appropriate examination and/or evaluation Counseling and educating the patient/family/caregiver Ordering medications, tests, or procedures Referring and communicating with other health care professionals (when not reported separately) Documenting clinical information in the electronic or other health record Independently interpreting results (not reported separately) and communicating results to the patient/family/caregiver Care coordination (not reported separately) Clinical documentation  Cassondra Stain, AGNP-C Palliative Medicine   Please contact Palliative Medicine Team phone at [phone] for questions and concerns.

## 2024-01-06 NOTE — Plan of Care (Signed)

## 2024-01-06 NOTE — Plan of Care (Signed)
 Palliative:  I spoke with cousin Tracie via phone. She reports that Ms. Pla had not made decision for care at home (hospice) per their last conversation. Tracie shares that she feels Ms. Elliott has been overwhelmed with multiple conversations. I am not currently at [facility] but will have my colleague follow up to clarify wishes for hospice vs desire for ongoing transfusions.   No charge  Bernarda Kitty, NP Palliative Medicine Team Pager [phone] (Please see [email or web address] for schedule) Team Phone [phone]

## 2024-01-06 NOTE — Progress Notes (Signed)
 [id number] [facility] Liaison Note  Notified by [facility] manager of patient/family request for [facility] Palliative services at home after discharge.  Hospital Liaison will follow patient for discharge disposition.  Please call with any hospice or palliative care related questions.  Thank you for the opportunity to participate in this patient's care.  Inocente Jacobs BSN [employer] [phone]

## 2024-01-06 NOTE — Progress Notes (Signed)
 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  Patient transfused 1 unit of PRBCs and 2 units of platelets.  PCCM/pulmonary consulted patient underwent thoracentesis on 01/05/2024.  Oncology consulted.  Palliative care consulted.   Assessment & Plan:   Principal Problem:   Acute myeloid leukemia not having achieved remission (HCC) Active Problems:   Pleural effusion   Thrombocytopenia   Chronic hyponatremia   Acute hypoxic respiratory failure (HCC)  #1 AML-with recurrent anemia and thrombocytopenia - Patient recently hospitalized 01/01/2024-01/03/2024 and noted to have received a unit of PRBCs and a unit of platelets during the hospitalization with no evidence of bleeding. - It is noted that per patient's oncologist at [facility] plan was to transfuse a unit of blood if hemoglobin was less than 7 and a WBC less than 30. - To consider 1 unit PRBC transfusion if WBC greater than 30 and patient symptomatic. - Transfuse 1 unit of PRBCs if hemoglobin less than 5 regardless of WBC. - For transfusion plan was to transfusion for platelets less than 20. - Patient admitted status post transfusion 2 units platelets. - Patient being transfused a unit of PRBCs today. - Posttransfusion CBC with a hemoglobin of 8.4, platelet count of 22, WBC of 69.4. - It is noted that patient would like to transfer her care locally, patient seen by Dr. Lanny in consultation. - Per oncology patient with a very poor prognosis and now recommending home hospice. - Palliative care consulted and following. - Per oncology patient desires to go  home with palliative care as she is not ready for hospice because she may still want transfusions of PRBCs. - Hydroxyurea  ordered however canceled by oncology as it is noted that patient is not tolerating due to her severe thrombocytopenia. - Patient noted to be high risk for readmissions. - Per oncology.  2.  Acute hypoxic respiratory failure secondary to right-sided pleural effusion -Patient noted with dyspnea on exertion as well as hypoxia right-sided chest pain. - CT chest obtained with concerns of organizing enlarging right pleural effusion with thin pleural enhancement suspicious for empyema.  Increasing compressive atelectasis dependently in the right upper middle and lower lobes.  Stable small left pleural effusion with patchy airspace disease posterior medially in the left lower lobe.  Stable mediastinal, hilar and upper abdominal adenopathy. - Patient seen in consultation by PCCM/pulmonary and underwent a ultrasound-guided thoracentesis on 01/05/2024 with 500 cc of serosanguineous appearing fluid drained from the right pleural space. - Postthoracentesis chest x-ray unchanged with concerns for ongoing right loculated effusion. - Per pulmonary patient with high LDH and protein consistent with exudate mostly mononuclear cells with other cells 43%, glucose less than 20 cultures negative to date. - Per pulmonary although empyema on differential, likely malignant effusion due to patient's leukemia and atypical cells. - Per pulmonary patient opted not to undergo further procedures including chest tube placement and will not be a candidate for intrapleural lytics. - Pulmonary recommending palliation and symptom management and patient started on Mucinex  and saline nebs for 2 to 3 days to help with expectorate. - Per pulmonary.  3.  Chronic hyponatremia -Gentle hydration.  4.  Chronic leukocytosis -Secondary to problem #1. - Per oncology.   DVT prophylaxis: SCDs.  Patient with thrombocytopenia  and anemia. Code Status: DNR Family Communication: Updated patient.  No family at bedside. Disposition: TBD.  Likely home with palliative care.  Status is: Inpatient Remains inpatient appropriate because: Severity of illness   Consultants:  Oncology: Dr. Lanny 01/05/2024 Pulmonary: Dr. Jude 01/05/2024 Palliative care: Dr. Clayton 01/05/2024  Procedures:  CT chest 01/05/2024 Chest x-ray 01/05/2024, 01/06/2024 Transfusion 1 unit PRBCs 01/06/2024 Transfusion 2 units platelets 01/05/2024 Ultrasound-guided thoracentesis: 500 cc serosanguineous appearing fluid drained from right pleural space.  PCCM/pulmonary: Dr.Alva 01/05/2024  Antimicrobials:  Anti-infectives (From admission, onward)    Start     Dose/Rate Route Frequency Ordered Stop   01/05/24 2200  vancomycin  (VANCOREADY) IVPB 1250 mg/250 mL        1,250 mg 166.7 mL/hr over 90 Minutes Intravenous Daily at bedtime 01/05/24 1353     01/05/24 1400  ceFEPIme  (MAXIPIME ) 2 g in sodium chloride  0.9 % 100 mL IVPB        2 g 200 mL/hr over 30 Minutes Intravenous 2 times daily 01/05/24 1351     01/05/24 1400  vancomycin  (VANCOCIN ) IVPB 1000 mg/200 mL premix        1,000 mg 200 mL/hr over 60 Minutes Intravenous  Once 01/05/24 1353 01/05/24 1822         Subjective: Patient denies any chest pain, some improvement with shortness of breath post thoracentesis.  Patient denies any bleeding.   Objective: Vitals:   01/06/24 0837 01/06/24 0913 01/06/24 1132 01/06/24 1408  BP: (!) 120/55 (!) 125/54 (!) 114/52 103/60  Pulse: 91 95 88 84  Resp: 17 18 20 18   Temp: (!) 97.5 F (36.4 C) 98.6 F (37 C) (!) 97.2 F (36.2 C) (!) 97.5 F (36.4 C)  TempSrc: Oral Oral Oral Oral  SpO2: 94% 94% 97% 96%  Weight:      Height:        Intake/Output Summary (Last 24 hours) at 01/06/2024 2044 Last data filed at 01/06/2024 1700 Gross per 24 hour  Intake 1760.38 ml  Output --  Net 1760.38 ml   Filed Weights   01/06/24 0350  Weight: 67 kg     Examination:  General exam: Appears calm and comfortable  Respiratory system: Decreased breath sounds in the right base.  No wheezing, no crackles.  Fair air movement.  Speaking in full sentences.  No use of accessory muscles of respiration.  Cardiovascular system: S1 & S2 heard, RRR. No JVD, murmurs, rubs, gallops or clicks. No pedal edema. Gastrointestinal system: Abdomen is nondistended, soft and nontender. No organomegaly or masses felt. Normal bowel sounds heard. Central nervous system: Alert and oriented. No focal neurological deficits. Extremities: Symmetric 5 x 5 power. Skin: No rashes, lesions or ulcers Psychiatry: Judgement and insight appear normal. Mood & affect appropriate.     Data Reviewed: I have personally reviewed following labs and imaging studies  CBC: Recent Labs  Lab 01/03/24 1215 01/05/24 0451 01/05/24 1329 01/06/24 0311 01/06/24 1637  WBC 52.7* 91.6* 89.5* 87.2* 69.4*  NEUTROABS  --   --   --   --  9.0*  HGB 7.7* 8.1* 7.2* 6.9* 8.4*  HCT 22.8* 24.2* 21.8* 20.7* 24.4*  MCV 88.0 87.4 88.3 88.5 85.3  PLT 21* 12* 21* 29* 22*    Basic Metabolic Panel: Recent Labs  Lab 01/01/24 1932 01/02/24 0430 01/03/24 0419 01/05/24 0451 01/06/24 0311  NA 128* 128* 130* 126* 128*  K 4.4 3.8 3.6 3.4* 3.5  CL 96* 96* 98 93* 94*  CO2 22 22 22  21* 23  GLUCOSE 101* 96 102* 124* 104*  BUN 17 16 10 15 18   CREATININE 0.72 0.71 0.64 0.80 0.90  CALCIUM 9.0 8.6* 8.6* 8.9 8.7*    GFR: Estimated Creatinine Clearance: 52.3 mL/min (by C-G formula based on SCr of 0.9 mg/dL).  Liver Function Tests: Recent Labs  Lab 01/01/24 1932 01/02/24 0430 01/05/24 0451 01/06/24 0311  AST 46* 35 37  --   ALT 58* 48* 41  --   ALKPHOS 190* 162* 157*  --   BILITOT 0.5 0.5 0.5  --   PROT 6.8 6.2* 6.5 5.8*  ALBUMIN 3.7 3.4* 3.5  --     CBG: No results for input(s): GLUCAP in the last 168 hours.   Recent Results (from the past 240 hours)  Resp panel by RT-PCR (RSV, Flu  A&B, Covid) Anterior Nasal Swab     Status: None   Collection Time: 01/05/24  4:51 AM   Specimen: Anterior Nasal Swab  Result Value Ref Range Status   SARS Coronavirus 2 by RT PCR NEGATIVE NEGATIVE Final    Comment: (NOTE) SARS-CoV-2 target nucleic acids are NOT DETECTED.  The SARS-CoV-2 RNA is generally detectable in upper respiratory specimens during the acute phase of infection. The lowest concentration of SARS-CoV-2 viral copies this assay can detect is 138 copies/mL. A negative result does not preclude SARS-Cov-2 infection and should not be used as the sole basis for treatment or other patient management decisions. A negative result may occur with  improper specimen collection/handling, submission of specimen other than nasopharyngeal swab, presence of viral mutation(s) within the areas targeted by this assay, and inadequate number of viral copies(<138 copies/mL). A negative result must be combined with clinical observations, patient history, and epidemiological information. The expected result is Negative.  Fact Sheet for Patients:  BloggerCourse.com  Fact Sheet for Healthcare Providers:  SeriousBroker.it  This test is no t yet approved or cleared by the [place]  FDA and  has been authorized for detection and/or diagnosis of SARS-CoV-2 by FDA under an Emergency Use Authorization (EUA). This EUA will remain  in effect (meaning this test can be used) for the duration of the COVID-19 declaration under Section 564(b)(1) of the Act, 21 U.S.C.section 360bbb-3(b)(1), unless the authorization is terminated  or revoked sooner.       Influenza A by PCR NEGATIVE NEGATIVE Final   Influenza B by PCR NEGATIVE NEGATIVE Final    Comment: (NOTE) The Xpert Xpress SARS-CoV-2/FLU/RSV plus assay is intended as an aid in the diagnosis of influenza from Nasopharyngeal swab specimens and should not be used as a sole basis for treatment.  Nasal washings and aspirates are unacceptable for Xpert Xpress SARS-CoV-2/FLU/RSV testing.  Fact Sheet for Patients: BloggerCourse.com  Fact Sheet for Healthcare Providers: SeriousBroker.it  This test is not yet approved or cleared by the [place]  FDA and has been authorized for detection and/or diagnosis of SARS-CoV-2 by FDA under an Emergency Use Authorization (EUA). This EUA will remain in effect (meaning this test can be used) for the duration of the COVID-19 declaration under Section 564(b)(1) of the Act, 21 U.S.C. section 360bbb-3(b)(1), unless the authorization is terminated or revoked.     Resp Syncytial Virus by PCR NEGATIVE NEGATIVE Final    Comment: (NOTE) Fact Sheet for Patients: BloggerCourse.com  Fact Sheet for Healthcare Providers: SeriousBroker.it  This test is not yet approved or cleared by the  [place]  FDA and has been authorized for detection and/or diagnosis of SARS-CoV-2 by FDA under an Emergency Use Authorization (EUA). This EUA will remain in effect (meaning this test can be used) for the duration of the COVID-19 declaration under Section 564(b)(1) of the Act, 21 U.S.C. section 360bbb-3(b)(1), unless the authorization is terminated or revoked.  Performed at [facility], [address], [place], [place] [address]   Body fluid culture w Gram Stain     Status: None (Preliminary result)   Collection Time: 01/05/24  5:42 PM   Specimen: Pleural Fluid  Result Value Ref Range Status   Specimen Description   Final    PLEURAL Performed at [facility], [address] [place], [place] [address]    Special Requests   Final    NONE Performed at [facility], [address] [place], [place] [address]    Gram Stain   Final    ABUNDANT WBC PRESENT, PREDOMINANTLY MONONUCLEAR NO ORGANISMS SEEN     Culture   Final    NO GROWTH < 24 HOURS Performed at [facility], [address], [place], [place] [address]    Report Status PENDING  Incomplete  MRSA Next Gen by PCR, Nasal     Status: None   Collection Time: 01/05/24  9:34 PM   Specimen: Nasal Mucosa; Nasal Swab  Result Value Ref Range Status   MRSA by PCR Next Gen NOT DETECTED NOT DETECTED Final    Comment: (NOTE) The GeneXpert MRSA Assay (FDA approved for NASAL specimens only), is one component of a comprehensive MRSA colonization surveillance program. It is not intended to diagnose MRSA infection nor to guide or monitor treatment for MRSA infections. Test performance is not FDA approved in patients less than 43 years old. Performed at [facility], [address] [place], [place] [address]          Radiology Studies: DG CHEST PORT 1 VIEW Result Date: 01/06/2024 CLINICAL DATA:  Right pleural effusion. EXAM: PORTABLE CHEST 1 VIEW COMPARISON:  01/05/2024 FINDINGS: Similar appearance of large right pleural effusion with posterior loculated component better characterized on yesterday's CT. Small left effusion seen on yesterday's CT study not readily evident. The cardio pericardial silhouette is enlarged. Right Port-A-Cath again noted. Bones are diffusely demineralized. IMPRESSION: No substantial change. Similar appearance of large right pleural effusion with posterior loculated component better characterized on yesterday's CT. Electronically Signed   By: Camellia Candle M.D.   On: 01/06/2024 05:25   DG Chest Port 1 View Result Date: 01/05/2024 CLINICAL DATA:  Status post thoracentesis. EXAM: PORTABLE CHEST 1 VIEW COMPARISON:  Chest radiograph dated 01/05/2024. FINDINGS: Right-sided Port-A-Cath in similar position. No significant interval change in the size of the right pleural effusion and associated atelectasis/infiltrate. Underlying mass is not excluded. Small left pleural effusion as seen previously. No  identifiable pneumothorax. Stable cardiac silhouette no acute osseous pathology. IMPRESSION: No significant interval change since the prior radiograph. Electronically Signed   By: Vanetta Chou M.D.   On: 01/05/2024 15:49   CT CHEST W CONTRAST Result Date: 01/05/2024 CLINICAL DATA:  Pleural effusion, malignancy suspected Chest pain and shortness of breath since yesterday. EXAM: CT CHEST WITH CONTRAST TECHNIQUE: Multidetector CT imaging of the chest was performed during intravenous contrast administration. RADIATION DOSE REDUCTION: This exam was performed according to the departmental dose-optimization program which includes automated exposure control, adjustment of the mA and/or kV according to patient size and/or use  of iterative reconstruction technique. CONTRAST:  75mL OMNIPAQUE  IOHEXOL  300 MG/ML  SOLN COMPARISON:  Chest CTA 01/02/2024, 09/24/2023 and 07/04/2023. FINDINGS: Cardiovascular: Accessed right IJ Port-A-Cath extends into the upper right atrium. No acute vascular findings are demonstrated. Mild atherosclerosis of the aorta, great vessels and coronary arteries. Calcifications of the aortic valve. The heart size is normal. There is no pericardial effusion. Mediastinum/Nodes: Multiple enlarged mediastinal and hilar lymph nodes are again noted, similar to recent prior studies. Representative lymph nodes include a 2.0 cm right hilar node on image 28/3 and a 1.9 cm subcarinal node on image 28/3. No progressive adenopathy identified in the short interval from the previous chest CT. There are small axillary lymph nodes bilaterally. The thyroid gland, trachea and esophagus demonstrate no significant findings. Lungs/Pleura: Organizing and enlarging right pleural effusion is associated with thin pleural enhancement, suspicious for empyema. There is a small dependent left pleural effusion without suspicious pleural enhancement. Increasing compressive atelectasis dependently in the right upper, middle and lower  lobes. Unchanged patchy airspace disease posteromedially in the left lower lobe. Upper abdomen: No acute findings are seen within the visualized upper abdomen. The spleen is moderately enlarged. There are mildly enlarged lymph nodes within the visualized upper abdomen, grossly stable. Musculoskeletal/Chest wall: There is no chest wall mass or suspicious osseous finding. Mild multilevel spondylosis. IMPRESSION: 1. Organizing and enlarging right pleural effusion with thin pleural enhancement, suspicious for empyema. Correlate clinically. Recommend thoracentesis. 2. Increasing compressive atelectasis dependently in the right upper, middle and lower lobes. 3. Stable small left pleural effusion with patchy airspace disease posteromedially in the left lower lobe. 4. 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 Given the pleural process, some of these lymph nodes could be reactive. Associated moderate splenomegaly. 5.  Aortic Atherosclerosis (ICD10-I70.0). Electronically Signed   By: Elsie Perone M.D.   On: 01/05/2024 10:38   DG Chest 2 View Result Date: 01/05/2024 CLINICAL DATA:  68 year old female with chest pain.  Leukemia. EXAM: CHEST - 2 VIEW COMPARISON:  CTA chest 01/02/2024 and earlier. FINDINGS: PA and lateral views 0425 hours. Right chest Port-A-Cath is stable. Combined layering and loculated pleural effusions right greater than left with veiling opacity in the lungs. That on the right has progressed since 01/01/2024. No superimposed pneumothorax or pulmonary edema. No air bronchograms. Stable cardiac size and mediastinal contours - with mediastinal and hilar lymphadenopathy better demonstrated by CT. Visualized tracheal air column is within normal limits. No acute osseous abnormality identified. Stable cholecystectomy clips. Nonobstructed visible bowel gas pattern. IMPRESSION: Progressed appearance of Right lung combined layering and  loculated pleural effusion since 01/01/2024 CT. Stable smaller left pleural effusion. Known mediastinal lymphadenopathy. Electronically Signed   By: VEAR Hurst M.D.   On: 01/05/2024 05:01        Scheduled Meds:  ALPRAZolam   0.5 mg Oral BID   ascorbic acid   500 mg Oral Daily   Chlorhexidine  Gluconate Cloth  6 each Topical Daily   cholecalciferol   400 Units Oral Daily   feeding supplement  237 mL Oral BID BM   guaiFENesin   600 mg Oral BID   nicotine   21 mg Transdermal Daily   pantoprazole   20 mg Oral Daily   sodium chloride  flush  10-40 mL Intracatheter Q12H   sodium chloride  HYPERTONIC  4 mL Nebulization BID   Continuous Infusions:  ceFEPime  (MAXIPIME ) IV 2 g (01/06/24 1232)   vancomycin  1,250 mg (01/05/24 2224)     LOS: 1 day  Time spent: 40 minutes    Toribio Hummer, MD [facility]   To contact the attending provider between 7A-7P or the covering provider during after hours 7P-7A, please log into the web site [email or web address] and access using universal [place] password for that web site. If you do not have the password, please call the hospital operator.  01/06/2024, 8:44 PM

## 2024-01-06 NOTE — Progress Notes (Signed)
 [name]   DOB:[birth year]   FM#:[id number]   [id number]  Oncology follow-up note  Subjective: Patient was able to ambulate in the hallway with oxygen and walker, but feels quite exhausted and dyspneic after that.   Objective:  Vitals:   01/06/24 1132 01/06/24 1408  BP: (!) 114/52 103/60  Pulse: 88 84  Resp: 20 18  Temp: (!) 97.2 F (36.2 C) (!) 97.5 F (36.4 C)  SpO2: 97% 96%    Body mass index is 24.58 kg/m.  Intake/Output Summary (Last 24 hours) at 01/06/2024 1752 Last data filed at 01/06/2024 1320 Gross per 24 hour  Intake 1280.38 ml  Output --  Net 1280.38 ml     Sclerae unicteric  Oropharynx clear  No peripheral adenopathy  MSK no focal spinal tenderness, no peripheral edema  Neuro nonfocal    CBG (last 3)  No results for input(s): GLUCAP in the last 72 hours.   Labs:  Lab Results  Component Value Date   WBC 69.4 (HH) 01/06/2024   HGB 8.4 (L) 01/06/2024   HCT 24.4 (L) 01/06/2024   MCV 85.3 01/06/2024   PLT 22 (LL) 01/06/2024   NEUTROABS PENDING 01/06/2024     Urine Studies No results for input(s): UHGB, CRYS in the last 72 hours.  Invalid input(s): UACOL, UAPR, USPG, UPH, UTP, UGL, UKET, UBIL, UNIT, UROB, [place], UEPI, UWBC, CORINN JERROL BURNS [place], [place]  Basic Metabolic Panel: Recent Labs  Lab 01/01/24 1932 01/02/24 0430 01/03/24 0419 01/05/24 0451 01/06/24 0311  NA 128* 128* 130* 126* 128*  K 4.4 3.8 3.6 3.4* 3.5  CL 96* 96* 98 93* 94*  CO2 22 22 22  21* 23  GLUCOSE 101* 96 102* 124* 104*  BUN 17 16 10 15 18   CREATININE 0.72 0.71 0.64 0.80 0.90  CALCIUM 9.0 8.6* 8.6* 8.9 8.7*   GFR Estimated Creatinine Clearance: 52.3 mL/min (by C-G formula based on SCr of 0.9 mg/dL). Liver Function Tests: Recent Labs  Lab 01/01/24 1932 01/02/24 0430 01/05/24 0451 01/06/24 0311  AST 46* 35 37  --   ALT 58* 48* 41  --   ALKPHOS 190* 162* 157*  --   BILITOT 0.5 0.5 0.5  --   PROT 6.8 6.2* 6.5 5.8*   ALBUMIN 3.7 3.4* 3.5  --    Recent Labs  Lab 01/01/24 1932  LIPASE 28   No results for input(s): AMMONIA in the last 168 hours. Coagulation profile Recent Labs  Lab 01/05/24 1001  INR 1.3*    CBC: Recent Labs  Lab 01/03/24 1215 01/05/24 0451 01/05/24 1329 01/06/24 0311 01/06/24 1637  WBC 52.7* 91.6* 89.5* 87.2* 69.4*  NEUTROABS  --   --   --   --  PENDING  HGB 7.7* 8.1* 7.2* 6.9* 8.4*  HCT 22.8* 24.2* 21.8* 20.7* 24.4*  MCV 88.0 87.4 88.3 88.5 85.3  PLT 21* 12* 21* 29* 22*   Cardiac Enzymes: No results for input(s): CKTOTAL, CKMB, CKMBINDEX, TROPONINI in the last 168 hours. BNP: Invalid input(s): POCBNP CBG: No results for input(s): GLUCAP in the last 168 hours. D-Dimer No results for input(s): DDIMER in the last 72 hours. Hgb A1c No results for input(s): HGBA1C in the last 72 hours. Lipid Profile No results for input(s): CHOL, HDL, LDLCALC, TRIG, CHOLHDL, LDLDIRECT in the last 72 hours. Thyroid function studies No results for input(s): TSH, T4TOTAL, T3FREE, THYROIDAB in the last 72 hours.  Invalid input(s): FREET3 Anemia work up No results for input(s): VITAMINB12, FOLATE, FERRITIN, TIBC, IRON,  RETICCTPCT in the last 72 hours. Microbiology Recent Results (from the past 240 hours)  Resp panel by RT-PCR (RSV, Flu A&B, Covid) Anterior Nasal Swab     Status: None   Collection Time: 01/05/24  4:51 AM   Specimen: Anterior Nasal Swab  Result Value Ref Range Status   SARS Coronavirus 2 by RT PCR NEGATIVE NEGATIVE Final    Comment: (NOTE) SARS-CoV-2 target nucleic acids are NOT DETECTED.  The SARS-CoV-2 RNA is generally detectable in upper respiratory specimens during the acute phase of infection. The lowest concentration of SARS-CoV-2 viral copies this assay can detect is 138 copies/mL. A negative result does not preclude SARS-Cov-2 infection and should not be used as the sole basis for treatment or other  patient management decisions. A negative result may occur with  improper specimen collection/handling, submission of specimen other than nasopharyngeal swab, presence of viral mutation(s) within the areas targeted by this assay, and inadequate number of viral copies(<138 copies/mL). A negative result must be combined with clinical observations, patient history, and epidemiological information. The expected result is Negative.  Fact Sheet for Patients:  BloggerCourse.com  Fact Sheet for Healthcare Providers:  SeriousBroker.it  This test is no t yet approved or cleared by the [place]  FDA and  has been authorized for detection and/or diagnosis of SARS-CoV-2 by FDA under an Emergency Use Authorization (EUA). This EUA will remain  in effect (meaning this test can be used) for the duration of the COVID-19 declaration under Section 564(b)(1) of the Act, 21 U.S.C.section 360bbb-3(b)(1), unless the authorization is terminated  or revoked sooner.       Influenza A by PCR NEGATIVE NEGATIVE Final   Influenza B by PCR NEGATIVE NEGATIVE Final    Comment: (NOTE) The Xpert Xpress SARS-CoV-2/FLU/RSV plus assay is intended as an aid in the diagnosis of influenza from Nasopharyngeal swab specimens and should not be used as a sole basis for treatment. Nasal washings and aspirates are unacceptable for Xpert Xpress SARS-CoV-2/FLU/RSV testing.  Fact Sheet for Patients: BloggerCourse.com  Fact Sheet for Healthcare Providers: SeriousBroker.it  This test is not yet approved or cleared by the [place]  FDA and has been authorized for detection and/or diagnosis of SARS-CoV-2 by FDA under an Emergency Use Authorization (EUA). This EUA will remain in effect (meaning this test can be used) for the duration of the COVID-19 declaration under Section 564(b)(1) of the Act, 21 U.S.C. section  360bbb-3(b)(1), unless the authorization is terminated or revoked.     Resp Syncytial Virus by PCR NEGATIVE NEGATIVE Final    Comment: (NOTE) Fact Sheet for Patients: BloggerCourse.com  Fact Sheet for Healthcare Providers: SeriousBroker.it  This test is not yet approved or cleared by the [place]  FDA and has been authorized for detection and/or diagnosis of SARS-CoV-2 by FDA under an Emergency Use Authorization (EUA). This EUA will remain in effect (meaning this test can be used) for the duration of the COVID-19 declaration under Section 564(b)(1) of the Act, 21 U.S.C. section 360bbb-3(b)(1), unless the authorization is terminated or revoked.  Performed at [facility], [address], [place], [place] [address]   Body fluid culture w Gram Stain     Status: None (Preliminary result)   Collection Time: 01/05/24  5:42 PM   Specimen: Pleural Fluid  Result Value Ref Range Status   Specimen Description   Final    PLEURAL Performed at [facility], [address] [place], [place] [address]    Special Requests  Final    NONE Performed at [facility], [address] [place], [place] [address]    Gram Stain   Final    ABUNDANT WBC PRESENT, PREDOMINANTLY MONONUCLEAR NO ORGANISMS SEEN    Culture   Final    NO GROWTH < 24 HOURS Performed at [facility], [address], [place], [place] [address]    Report Status PENDING  Incomplete  MRSA Next Gen by PCR, Nasal     Status: None   Collection Time: 01/05/24  9:34 PM   Specimen: Nasal Mucosa; Nasal Swab  Result Value Ref Range Status   MRSA by PCR Next Gen NOT DETECTED NOT DETECTED Final    Comment: (NOTE) The GeneXpert MRSA Assay (FDA approved for NASAL specimens only), is one component of a comprehensive MRSA colonization surveillance program. It is not intended to diagnose MRSA infection nor to guide or  monitor treatment for MRSA infections. Test performance is not FDA approved in patients less than 37 years old. Performed at [facility], [address], [place], [place] [address]       Studies:  DG CHEST PORT 1 VIEW Result Date: 01/06/2024 CLINICAL DATA:  Right pleural effusion. EXAM: PORTABLE CHEST 1 VIEW COMPARISON:  01/05/2024 FINDINGS: Similar appearance of large right pleural effusion with posterior loculated component better characterized on yesterday's CT. Small left effusion seen on yesterday's CT study not readily evident. The cardio pericardial silhouette is enlarged. Right Port-A-Cath again noted. Bones are diffusely demineralized. IMPRESSION: No substantial change. Similar appearance of large right pleural effusion with posterior loculated component better characterized on yesterday's CT. Electronically Signed   By: Camellia Candle M.D.   On: 01/06/2024 05:25   DG Chest Port 1 View Result Date: 01/05/2024 CLINICAL DATA:  Status post thoracentesis. EXAM: PORTABLE CHEST 1 VIEW COMPARISON:  Chest radiograph dated 01/05/2024. FINDINGS: Right-sided Port-A-Cath in similar position. No significant interval change in the size of the right pleural effusion and associated atelectasis/infiltrate. Underlying mass is not excluded. Small left pleural effusion as seen previously. No identifiable pneumothorax. Stable cardiac silhouette no acute osseous pathology. IMPRESSION: No significant interval change since the prior radiograph. Electronically Signed   By: Vanetta Chou M.D.   On: 01/05/2024 15:49   CT CHEST W CONTRAST Result Date: 01/05/2024 CLINICAL DATA:  Pleural effusion, malignancy suspected Chest pain and shortness of breath since yesterday. EXAM: CT CHEST WITH CONTRAST TECHNIQUE: Multidetector CT imaging of the chest was performed during intravenous contrast administration. RADIATION DOSE REDUCTION: This exam was performed according to the departmental dose-optimization  program which includes automated exposure control, adjustment of the mA and/or kV according to patient size and/or use of iterative reconstruction technique. CONTRAST:  75mL OMNIPAQUE  IOHEXOL  300 MG/ML  SOLN COMPARISON:  Chest CTA 01/02/2024, 09/24/2023 and 07/04/2023. FINDINGS: Cardiovascular: Accessed right IJ Port-A-Cath extends into the upper right atrium. No acute vascular findings are demonstrated. Mild atherosclerosis of the aorta, great vessels and coronary arteries. Calcifications of the aortic valve. The heart size is normal. There is no pericardial effusion. Mediastinum/Nodes: Multiple enlarged mediastinal and hilar lymph nodes are again noted, similar to recent prior studies. Representative lymph nodes include a 2.0 cm right hilar node on image 28/3 and a 1.9 cm subcarinal node on image 28/3. No progressive adenopathy identified in the short interval from the previous chest CT. There are small axillary lymph nodes bilaterally. The thyroid gland, trachea and esophagus demonstrate no significant findings. Lungs/Pleura: Organizing and enlarging right pleural effusion is  associated with thin pleural enhancement, suspicious for empyema. There is a small dependent left pleural effusion without suspicious pleural enhancement. Increasing compressive atelectasis dependently in the right upper, middle and lower lobes. Unchanged patchy airspace disease posteromedially in the left lower lobe. Upper abdomen: No acute findings are seen within the visualized upper abdomen. The spleen is moderately enlarged. There are mildly enlarged lymph nodes within the visualized upper abdomen, grossly stable. Musculoskeletal/Chest wall: There is no chest wall mass or suspicious osseous finding. Mild multilevel spondylosis. IMPRESSION: 1. Organizing and enlarging right pleural effusion with thin pleural enhancement, suspicious for empyema. Correlate clinically. Recommend thoracentesis. 2. Increasing compressive atelectasis  dependently in the right upper, middle and lower lobes. 3. Stable small left pleural effusion with patchy airspace disease posteromedially in the left lower lobe. 4. 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 Given the pleural process, some of these lymph nodes could be reactive. Associated moderate splenomegaly. 5.  Aortic Atherosclerosis (ICD10-I70.0). Electronically Signed   By: Elsie Perone M.D.   On: 01/05/2024 10:38   DG Chest 2 View Result Date: 01/05/2024 CLINICAL DATA:  68 year old female with chest pain.  Leukemia. EXAM: CHEST - 2 VIEW COMPARISON:  CTA chest 01/02/2024 and earlier. FINDINGS: PA and lateral views 0425 hours. Right chest Port-A-Cath is stable. Combined layering and loculated pleural effusions right greater than left with veiling opacity in the lungs. That on the right has progressed since 01/01/2024. No superimposed pneumothorax or pulmonary edema. No air bronchograms. Stable cardiac size and mediastinal contours - with mediastinal and hilar lymphadenopathy better demonstrated by CT. Visualized tracheal air column is within normal limits. No acute osseous abnormality identified. Stable cholecystectomy clips. Nonobstructed visible bowel gas pattern. IMPRESSION: Progressed appearance of Right lung combined layering and loculated pleural effusion since 01/01/2024 CT. Stable smaller left pleural effusion. Known mediastinal lymphadenopathy. Electronically Signed   By: VEAR Hurst M.D.   On: 01/05/2024 05:01    Assessment: 68 y.o. female   AML with significant leukocytosis Right pleural effusion, likely secondary to AML, [place] still pending Anemia and thrombocytopenia, secondary to AML and Hydrea  Deconditioning DNR    Plan:  - I again reviewed to the overall very poor prognosis, and my recommendation of home hospice.  Patient has met Perative care and hospice liaison today.  After multiple discussion,  patient desires to go home with palliative care, she is not ready for hospice because she still wants blood transfusion. - I will set up her follow-up and blood transfusion later this week in my office. - I have canceled her Hydrea , she is not tolerating due to her severe thrombocytopenia. - Will continue discussed goal of care after discharge.  She is at very high risk for readmission.   Onita Mattock, MD 01/06/2024  5:52 PM

## 2024-01-06 NOTE — Progress Notes (Signed)
 68 year old woman with AML who follows up at [facility].  She did not want aggressive therapy and opted for palliative hydroxyurea .  Course has been complicated by recurrent thrombocytopenia and anemia requiring transfusions and recurrent effusions.  Thoracentesis 09/2023 showed exudative fluid with predominant mononuclear cells.  CT imaging showed progressive thoracic and abdominal pelvic lymphadenopathy with a moderate right effusion. She is now readmitted in 2 days after discharge with respiratory distress and hypoxia, repeat CT imaging shows enlarging right pleural effusion with pleural enhancement with atelectasis of the right lower lobes, suspicious for empyema Labs show platelet count of 12K, increased leukocytosis from her baseline 50s to 91K and stable anemia 8.1.  We undertook thoracentesis yesterday and about 500 cc of bloody fluid Chest x-ray is mostly unchanged and continues to show right loculated effusion. Pleural fluid shows high LDH and protein consistent with exudate, mostly mononuclear cells and other cells 43%, glucose less than 20 , culture negative so far     01/06/2024    9:13 AM 01/06/2024    8:37 AM 01/06/2024    3:50 AM  Vitals with BMI  Height   5' 5  Weight   147 lbs 11 oz  BMI   24.58  Systolic 125 120   Diastolic 54 55   Pulse 95 91      She complains of unable to bring up sputum felt better with coughing  On exam -decreased breath sounds on right, no accessory muscle use, S1-S2 regular, nasal cannula  Impression/plan While empyema remains in the differential especially due to low glucose and loculation, this is more likely malignant effusion due to leukemia and the atypical cells likely confirm.  Also lack of neutrophils goes against empyema.  Regardless she has opted not to undergo further procedures including chest tube placement she would not be a candidate for intrapleural lytics.  Overall goal would be palliation and symptom management at this point.   Reviewed oncology and palliative care input, hospice is being planned for. I will recommend a regimen of Mucinex , saline nebs for 2 to 3 days to enable her to expectorate.  After that, would be okay to use antitussives containing codeine.  Updated daughter at bedside  PCCM will be available as needed  Keosha Rossa V. Jude MD

## 2024-01-07 DIAGNOSIS — C9200 Acute myeloblastic leukemia, not having achieved remission: Principal | ICD-10-CM

## 2024-01-07 DIAGNOSIS — J90 Pleural effusion, not elsewhere classified: Secondary | ICD-10-CM

## 2024-01-07 DIAGNOSIS — E871 Hypo-osmolality and hyponatremia: Secondary | ICD-10-CM

## 2024-01-07 DIAGNOSIS — J9601 Acute respiratory failure with hypoxia: Secondary | ICD-10-CM | POA: Diagnosis not present

## 2024-01-07 DIAGNOSIS — D696 Thrombocytopenia, unspecified: Secondary | ICD-10-CM | POA: Diagnosis not present

## 2024-01-07 LAB — CBC WITH DIFFERENTIAL/PLATELET
Abs Immature Granulocytes: 2.9 K/uL — ABNORMAL HIGH (ref 0.00–0.07)
Basophils Absolute: 0.7 K/uL — ABNORMAL HIGH (ref 0.0–0.1)
Basophils Relative: 1 %
Blasts: 60 %
Eosinophils Absolute: 0 K/uL (ref 0.0–0.5)
Eosinophils Relative: 0 %
HCT: 23.6 % — ABNORMAL LOW (ref 36.0–46.0)
Hemoglobin: 8.2 g/dL — ABNORMAL LOW (ref 12.0–15.0)
Lymphocytes Relative: 24 %
Lymphs Abs: 17.7 K/uL — ABNORMAL HIGH (ref 0.7–4.0)
MCH: 29.6 pg (ref 26.0–34.0)
MCHC: 34.7 g/dL (ref 30.0–36.0)
MCV: 85.2 fL (ref 80.0–100.0)
Metamyelocytes Relative: 1 %
Monocytes Absolute: 2.2 K/uL — ABNORMAL HIGH (ref 0.1–1.0)
Monocytes Relative: 3 %
Myelocytes: 2 %
Neutro Abs: 5.9 K/uL (ref 1.7–7.7)
Neutrophils Relative %: 8 %
Platelets: 19 K/uL — CL (ref 150–400)
Promyelocytes Relative: 1 %
RBC: 2.77 MIL/uL — ABNORMAL LOW (ref 3.87–5.11)
RDW: 17.2 % — ABNORMAL HIGH (ref 11.5–15.5)
WBC: 73.7 K/uL (ref 4.0–10.5)
nRBC: 0 % (ref 0.0–0.2)

## 2024-01-07 LAB — COMPREHENSIVE METABOLIC PANEL WITH GFR
ALT: 54 U/L — ABNORMAL HIGH (ref 0–44)
AST: 44 U/L — ABNORMAL HIGH (ref 15–41)
Albumin: 3 g/dL — ABNORMAL LOW (ref 3.5–5.0)
Alkaline Phosphatase: 132 U/L — ABNORMAL HIGH (ref 38–126)
Anion gap: 11 (ref 5–15)
BUN: 21 mg/dL (ref 8–23)
CO2: 23 mmol/L (ref 22–32)
Calcium: 8.5 mg/dL — ABNORMAL LOW (ref 8.9–10.3)
Chloride: 96 mmol/L — ABNORMAL LOW (ref 98–111)
Creatinine, Ser: 0.69 mg/dL (ref 0.44–1.00)
GFR, Estimated: >60 mL/min (ref >60)
Glucose, Bld: 103 mg/dL — ABNORMAL HIGH (ref 70–99)
Potassium: 3.4 mmol/L — ABNORMAL LOW (ref 3.5–5.1)
Sodium: 129 mmol/L — ABNORMAL LOW (ref 135–145)
Total Bilirubin: 0.5 mg/dL (ref 0.0–1.2)
Total Protein: 5.6 g/dL — ABNORMAL LOW (ref 6.5–8.1)

## 2024-01-07 LAB — MAGNESIUM: Magnesium: 2.1 mg/dL (ref 1.7–2.4)

## 2024-01-07 LAB — CYTOLOGY - NON PAP

## 2024-01-07 MED ORDER — POTASSIUM CHLORIDE CRYS ER 20 MEQ PO TBCR
60.0000 meq | EXTENDED_RELEASE_TABLET | Freq: Once | ORAL | Status: AC
  Administered 2024-01-07: 60 meq via ORAL
  Filled 2024-01-07: qty 3

## 2024-01-07 MED ORDER — SODIUM CHLORIDE 1 G PO TABS
1.0000 g | ORAL_TABLET | Freq: Two times a day (BID) | ORAL | Status: DC
  Administered 2024-01-07 – 2024-01-08 (×3): 1 g via ORAL
  Filled 2024-01-07 (×3): qty 1

## 2024-01-07 MED ORDER — DIPHENHYDRAMINE HCL 25 MG PO CAPS
25.0000 mg | ORAL_CAPSULE | Freq: Four times a day (QID) | ORAL | Status: DC | PRN
Start: 2024-01-07 — End: 2024-01-08

## 2024-01-07 MED ORDER — LEVOFLOXACIN 500 MG PO TABS
750.0000 mg | ORAL_TABLET | Freq: Every day | ORAL | Status: DC
  Administered 2024-01-07 – 2024-01-08 (×2): 750 mg via ORAL
  Filled 2024-01-07 (×2): qty 2

## 2024-01-07 MED ORDER — METRONIDAZOLE 500 MG PO TABS
500.0000 mg | ORAL_TABLET | Freq: Two times a day (BID) | ORAL | Status: DC
Start: 2024-01-07 — End: 2024-01-08
  Administered 2024-01-07 – 2024-01-08 (×3): 500 mg via ORAL
  Filled 2024-01-07 (×3): qty 1

## 2024-01-07 MED ORDER — METHOCARBAMOL 1000 MG/10ML IJ SOLN
750.0000 mg | Freq: Three times a day (TID) | INTRAMUSCULAR | Status: DC | PRN
  Administered 2024-01-07: 750 mg via INTRAVENOUS
  Filled 2024-01-07: qty 10

## 2024-01-07 NOTE — Progress Notes (Signed)
 Daily Progress Note   Patient Name: [name]       Date: 01/07/2024 DOB: 30-Dec-1955  Age: 68 y.o. MRN#: [id number] Attending Physician: Kathrin Mignon DASEN, MD Primary Care Physician: Melanee Annah BROCKS, MD Admit Date: 01/05/2024  Reason for Consultation/Follow-up: Establishing goals of care  Patient Profile/HPI: 68 y.o. 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 Chandra) admitted on 01/05/2024 with shortness of breath and right-sided chest pain found to have recurrent thrombocytopenia and loculated right-sided pleural effusion. Recurrent admissions requiring blood and platelet transfusions and s/p right-sided thoracentesis 770 mL of bloody fluid removed. Oncology consulted for consideration of transfer of care locally.     Subjective: Chart reviewed including labs, progress notes, imaging from this and previous encounters.  Patient awake, alert.  She had no questions or complaints today.   Review of Systems  Constitutional:  Positive for malaise/fatigue.     Physical Exam Vitals and nursing note reviewed.  Constitutional:      General: She is not in acute distress. Cardiovascular:     Rate and Rhythm: Normal rate.  Pulmonary:     Effort: Pulmonary effort is normal.  Neurological:     Mental Status: She is alert and oriented to person, place, and time.     Comments: Some forgetfulness             Vital Signs: BP (!) 112/59 (BP Location: Left Arm)   Pulse 88   Temp 97.7 F (36.5 C)   Resp 18   Ht 5' 5 (1.651 m)   Wt 67 kg   SpO2 91%   BMI 24.58 kg/m  SpO2: SpO2: 91 % O2 Device: O2 Device: Room Air O2 Flow Rate: O2 Flow Rate (L/min): 2 L/min  Intake/output summary:  Intake/Output Summary (Last 24 hours) at 01/07/2024 1326 Last data  filed at 01/06/2024 1700 Gross per 24 hour  Intake 360 ml  Output --  Net 360 ml   LBM: Last BM Date : 01/04/24 Baseline Weight: Weight: 67 kg Most recent weight: Weight: 67 kg       Palliative Assessment/Data: PPS: 50%      Patient Active Problem List   Diagnosis Date Noted   Acute hypoxic respiratory failure (HCC) 01/06/2024   Acute leukemia (HCC) 12/21/2023   Pleural  effusion 09/24/2023   Thrombocytopenia 09/24/2023   Chronic hyponatremia 09/24/2023   Colitis 07/05/2023   Multifocal pneumonia 07/04/2023   Pancytopenia (HCC) 07/04/2023   Gout 07/04/2023   Acute myeloid leukemia not having achieved remission (HCC) 03/10/2023   Anemia 03/04/2023   Aortic atherosclerosis 03/04/2023    Palliative Care Assessment & Plan    Assessment/Recommendations/Plan  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 Lanny, patient's family  Thank you for allowing the Palliative Medicine Team to assist in the care of this patient.  Total time:  Prolonged billing:  Time includes:   Preparing to see the patient (e.g., review of tests) Obtaining and/or reviewing separately obtained history Performing a medically necessary appropriate examination and/or evaluation Counseling and educating the patient/family/caregiver Ordering medications, tests, or procedures Referring and communicating with other health care professionals (when not reported separately) Documenting clinical information in the electronic or other health record Independently interpreting results (not reported separately) and communicating results  to the patient/family/caregiver Care coordination (not reported separately) Clinical documentation  Cassondra Stain, AGNP-C Palliative Medicine   Please contact Palliative Medicine Team phone at [phone] for questions and concerns.

## 2024-01-07 NOTE — Progress Notes (Signed)
 PROGRESS NOTE  [name] FMW:[id number] DOB: [birth year]   PCP: Melanee Annah BROCKS, MD  Patient is from: Home.  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 Patient transfused 1 unit of PRBCs and 2 units of platelets. PCCM/pulmonary consulted patient underwent thoracentesis with removal of 500 cc fluid on 01/05/2024.  Oncology and palliative following.  Subjective: Seen and examined earlier this morning.  No major events overnight or this morning.  Felt severe pain in her left eye under her left groin when she got up this morning.  She describes the pain as charley horse.  Pain was severe.  She is wondering if she pulled a muscle.  Objective: Vitals:   01/07/24 0513 01/07/24 0758 01/07/24 0844 01/07/24 0845  BP: (!) 112/59     Pulse: 88     Resp: 18     Temp: 97.7 F (36.5 C)     TempSrc:      SpO2: 93% 95% (!) 88% 91%  Weight:      Height:        Examination:  GENERAL: No apparent distress.  Nontoxic. HEENT: MMM.  Vision and hearing grossly intact.  NECK: Supple.  No apparent JVD.  RESP:  No IWOB.  Fair aeration bilaterally. CVS:  RRR. Heart sounds normal.  ABD/GI/GU: BS+. Abd soft, NTND.  MSK/EXT:  Moves extremities. No apparent deformity. No edema.  Slight tenderness in left groin area.  No swelling or erythema.  No palpable mass. SKIN: no apparent skin lesion or wound NEURO: AA.  Oriented appropriately.  No apparent focal neuro deficit. PSYCH: Calm. Normal affect.   Consultants:  Oncology Pulmonology Palliative medicine  Procedures: 9/29-right thoracocentesis with removal of 500 cc fluid  Microbiology summarized: 9/29-COVID-19, influenza and RSV  PCR nonreactive 9/29-MRSA PCR screen nonreactive 9/29-right pleural fluid culture NGTD  Assessment and plan:  AML-with recurrent anemia and thrombocytopenia: Seems hospitalized and transfused from 9/25-9/27.  Normally follows at [facility] but transferred her care locally, and seen by Dr.  Lanny.  Deemed to have very poor prognosis and home hospice was recommended.  However, patient is not interested in blood transfusion and desires to go home with palliative instead of hospice.  Hydroxyurea  discontinued due to sev thrombocytopenia. - Oncology recommends outpatient follow-up -Palliative medicine following.   Acute hypoxic respiratory failure secondary to right-sided pleural effusion: S/p right thoracocentesis with removal of 500 cc exudative culture negative fluid.  Concern about malignant effusion.  Postthoracentesis x-ray without significant change. Per pulmonary patient opted not to undergo further procedures including chest tube placement and will not be a candidate for intrapleural lytics.  Pulmonary recommended palliative and symptom management. -De-escalate antibiotics to Levaquin  and Flagyl -Continue supportive care with incentive spirometry, mucolytics, antitussive  -Wean oxygen as able.  Blood Torrey saturation  Chronic leukocytosis/severe thrombocytopenia/anemia: Due to #1. -Per oncology.   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  Stable. -Start p.o. sodium chloride  tablets  Elevated liver enzymes: Likely due to #1. - Monitor intermittently  Left thigh/groin pain: Spasm?  Hyperviscosity? -Replenish hypokalemia - Pain meds.  Muscle relaxer if no improvement  Tobacco use disorder - Continue nicotine  patch  Hypokalemia -Monitor replenish K and Mg as appropriate   Body mass index is 24.58 kg/m.  DVT prophylaxis:  Place and maintain sequential compression device Start: 01/06/24 2032 SCDs Start: 01/05/24 9187  Code Status: DNR Family Communication: None at bedside Level  of care: Progressive Status is: Inpatient Remains inpatient appropriate because: Respiratory failure, pain   Final disposition: Home   55 minutes with more than 50% spent in reviewing records, counseling patient/family and coordinating care.   Sch Meds:  Scheduled Meds:  ALPRAZolam   0.5 mg Oral BID   ascorbic acid   500 mg Oral Daily   Chlorhexidine  Gluconate Cloth  6 each Topical Daily   cholecalciferol   400 Units Oral Daily   feeding supplement  237 mL Oral BID BM   guaiFENesin   600 mg Oral BID   levofloxacin   750 mg Oral Daily   metroNIDAZOLE  500 mg Oral Q12H   nicotine   21 mg Transdermal Daily   pantoprazole   20 mg Oral Daily   sodium chloride  flush  10-40 mL Intracatheter Q12H   sodium chloride  HYPERTONIC  4 mL Nebulization BID   Continuous Infusions: PRN Meds:.acetaminophen  **OR** acetaminophen , albuterol , guaiFENesin -dextromethorphan , hydrOXYzine , morphine  injection, ondansetron  **OR** ondansetron  (ZOFRAN ) IV, mouth rinse, prochlorperazine , promethazine , senna, sodium chloride  flush, traMADol , traZODone   Antimicrobials: Anti-infectives (From admission, onward)    Start     Dose/Rate Route Frequency Ordered Stop   01/07/24 1000  levofloxacin  (LEVAQUIN ) tablet 750 mg        750 mg Oral Daily 01/07/24 0757     01/07/24 1000  metroNIDAZOLE (FLAGYL) tablet 500 mg        500 mg Oral Every 12 hours 01/07/24 0757     01/05/24 2200  vancomycin  (VANCOREADY) IVPB 1250 mg/250 mL  Status:  Discontinued        1,250 mg 166.7 mL/hr over 90 Minutes Intravenous Daily at bedtime 01/05/24 1353 01/07/24 0757   01/05/24 1400  ceFEPIme  (MAXIPIME ) 2 g in sodium chloride  0.9 % 100 mL IVPB  Status:  Discontinued        2 g 200 mL/hr over 30 Minutes Intravenous 2 times daily 01/05/24 1351 01/07/24 0757   01/05/24 1400  vancomycin  (VANCOCIN ) IVPB 1000 mg/200 mL premix        1,000 mg 200 mL/hr over 60 Minutes Intravenous  Once 01/05/24 1353 01/05/24 1822        I have personally  reviewed the following labs and images: CBC: Recent Labs  Lab 01/05/24 0451 01/05/24 1329 01/06/24 0311 01/06/24 1637 01/07/24 0202  WBC 91.6* 89.5* 87.2* 69.4* 73.7*  NEUTROABS  --   --   --  9.0* 5.9  HGB 8.1* 7.2* 6.9* 8.4* 8.2*  HCT 24.2* 21.8* 20.7* 24.4* 23.6*  MCV 87.4 88.3 88.5 85.3 85.2  PLT 12* 21* 29* 22* 19*   BMP &GFR Recent Labs  Lab 01/02/24 0430 01/03/24 0419 01/05/24 0451 01/06/24 0311 01/07/24 0202  NA 128* 130* 126* 128* 129*  K 3.8 3.6 3.4* 3.5 3.4*  CL 96* 98 93* 94* 96*  CO2 22 22 21* 23 23  GLUCOSE 96 102* 124* 104* 103*  BUN 16 10 15 18 21   CREATININE 0.71 0.64 0.80 0.90 0.69  CALCIUM 8.6* 8.6* 8.9 8.7* 8.5*  MG  --   --   --   --  2.1   Estimated Creatinine Clearance: 58.9 mL/min (by C-G formula based on SCr of 0.69 mg/dL). Liver & Pancreas: Recent Labs  Lab 01/01/24 1932 01/02/24 0430 01/05/24 0451 01/06/24 0311 01/07/24 0202  AST 46* 35 37  --  44*  ALT 58* 48*  41  --  54*  ALKPHOS 190* 162* 157*  --  132*  BILITOT 0.5 0.5 0.5  --  0.5  PROT 6.8 6.2* 6.5 5.8* 5.6*  ALBUMIN 3.7 3.4* 3.5  --  3.0*   Recent Labs  Lab 01/01/24 1932  LIPASE 28   No results for input(s): AMMONIA in the last 168 hours. Diabetic: No results for input(s): HGBA1C in the last 72 hours. No results for input(s): GLUCAP in the last 168 hours. Cardiac Enzymes: No results for input(s): CKTOTAL, CKMB, CKMBINDEX, TROPONINI in the last 168 hours. No results for input(s): PROBNP in the last 8760 hours. Coagulation Profile: Recent Labs  Lab 01/05/24 1001  INR 1.3*   Thyroid Function Tests: No results for input(s): TSH, T4TOTAL, FREET4, T3FREE, THYROIDAB in the last 72 hours. Lipid Profile: No results for input(s): CHOL, HDL, LDLCALC, TRIG, CHOLHDL, LDLDIRECT in the last 72 hours. Anemia Panel: No results for input(s): VITAMINB12, FOLATE, FERRITIN, TIBC, IRON, RETICCTPCT in the last 72 hours. Urine  analysis:    Component Value Date/Time   COLORURINE YELLOW 01/01/2024 2231   APPEARANCEUR HAZY (A) 01/01/2024 2231   LABSPEC 1.014 01/01/2024 2231   PHURINE 6.0 01/01/2024 2231   GLUCOSEU NEGATIVE 01/01/2024 2231   HGBUR SMALL (A) 01/01/2024 2231   BILIRUBINUR NEGATIVE 01/01/2024 2231   KETONESUR NEGATIVE 01/01/2024 2231   PROTEINUR NEGATIVE 01/01/2024 2231   UROBILINOGEN 0.2 09/04/2008 2146   NITRITE NEGATIVE 01/01/2024 2231   LEUKOCYTESUR NEGATIVE 01/01/2024 2231   Sepsis Labs: Invalid input(s): PROCALCITONIN, LACTICIDVEN  Microbiology: Recent Results (from the past 240 hours)  Resp panel by RT-PCR (RSV, Flu A&B, Covid) Anterior Nasal Swab     Status: None   Collection Time: 01/05/24  4:51 AM   Specimen: Anterior Nasal Swab  Result Value Ref Range Status   SARS Coronavirus 2 by RT PCR NEGATIVE NEGATIVE Final    Comment: (NOTE) SARS-CoV-2 target nucleic acids are NOT DETECTED.  The SARS-CoV-2 RNA is generally detectable in upper respiratory specimens during the acute phase of infection. The lowest concentration of SARS-CoV-2 viral copies this assay can detect is 138 copies/mL. A negative result does not preclude SARS-Cov-2 infection and should not be used as the sole basis for treatment or other patient management decisions. A negative result may occur with  improper specimen collection/handling, submission of specimen other than nasopharyngeal swab, presence of viral mutation(s) within the areas targeted by this assay, and inadequate number of viral copies(<138 copies/mL). A negative result must be combined with clinical observations, patient history, and epidemiological information. The expected result is Negative.  Fact Sheet for Patients:  BloggerCourse.com  Fact Sheet for Healthcare Providers:  SeriousBroker.it  This test is no t yet approved or cleared by the [place]  FDA and  has been authorized for  detection and/or diagnosis of SARS-CoV-2 by FDA under an Emergency Use Authorization (EUA). This EUA will remain  in effect (meaning this test can be used) for the duration of the COVID-19 declaration under Section 564(b)(1) of the Act, 21 U.S.C.section 360bbb-3(b)(1), unless the authorization is terminated  or revoked sooner.       Influenza A by PCR NEGATIVE NEGATIVE Final   Influenza B by PCR NEGATIVE NEGATIVE Final    Comment: (NOTE) The Xpert Xpress SARS-CoV-2/FLU/RSV plus assay is intended as an aid in the diagnosis of influenza from Nasopharyngeal swab specimens and should not be used as a sole basis for treatment. Nasal washings and aspirates are unacceptable for Xpert Xpress SARS-CoV-2/FLU/RSV testing.  Fact Sheet for Patients: BloggerCourse.com  Fact Sheet for Healthcare Providers: SeriousBroker.it  This test is not yet approved or cleared by the [place]  FDA and has been authorized for detection and/or diagnosis of SARS-CoV-2 by FDA under an Emergency Use Authorization (EUA). This EUA will remain in effect (meaning this test can be used) for the duration of the COVID-19 declaration under Section 564(b)(1) of the Act, 21 U.S.C. section 360bbb-3(b)(1), unless the authorization is terminated or revoked.     Resp Syncytial Virus by PCR NEGATIVE NEGATIVE Final    Comment: (NOTE) Fact Sheet for Patients: BloggerCourse.com  Fact Sheet for Healthcare Providers: SeriousBroker.it  This test is not yet approved or cleared by the [place]  FDA and has been authorized for detection and/or diagnosis of SARS-CoV-2 by FDA under an Emergency Use Authorization (EUA). This EUA will remain in effect (meaning this test can be used) for the duration of the COVID-19 declaration under Section 564(b)(1) of the Act, 21 U.S.C. section 360bbb-3(b)(1), unless the authorization is  terminated or revoked.  Performed at [facility], [address] [place], [place] [address]   Body fluid culture w Gram Stain     Status: None (Preliminary result)   Collection Time: 01/05/24  5:42 PM   Specimen: Pleural Fluid  Result Value Ref Range Status   Specimen Description   Final    PLEURAL Performed at [facility], [address], [place], [place] [address]    Special Requests   Final    NONE Performed at [facility], [address] [place], [place] [address]    Gram Stain   Final    ABUNDANT WBC PRESENT, PREDOMINANTLY MONONUCLEAR NO ORGANISMS SEEN    Culture   Final    NO GROWTH 2 DAYS Performed at [facility], [address], [place], [place] [address]    Report Status PENDING  Incomplete  MRSA Next Gen by PCR, Nasal     Status: None   Collection Time: 01/05/24  9:34 PM   Specimen: Nasal Mucosa; Nasal Swab  Result Value Ref Range Status   MRSA by PCR Next Gen NOT DETECTED NOT DETECTED Final    Comment: (NOTE) The GeneXpert MRSA Assay (FDA approved for NASAL specimens only), is one component of a comprehensive MRSA colonization surveillance program. It is not intended to diagnose MRSA infection nor to guide or monitor treatment for MRSA infections. Test performance is not FDA approved in patients less than 62 years old. Performed at [facility], [address], [place], [place] [address]     Radiology Studies: No results found.    Rohini Jaroszewski T. Valaree Fresquez [facility]  If 7PM-7AM, please contact night-coverage [email or web address] 01/07/2024, 12:52 PM

## 2024-01-08 ENCOUNTER — Encounter: Payer: Self-pay | Admitting: Oncology

## 2024-01-08 ENCOUNTER — Other Ambulatory Visit (HOSPITAL_COMMUNITY): Payer: Self-pay

## 2024-01-08 DIAGNOSIS — J90 Pleural effusion, not elsewhere classified: Secondary | ICD-10-CM | POA: Diagnosis not present

## 2024-01-08 DIAGNOSIS — J9601 Acute respiratory failure with hypoxia: Secondary | ICD-10-CM | POA: Diagnosis not present

## 2024-01-08 DIAGNOSIS — C9200 Acute myeloblastic leukemia, not having achieved remission: Secondary | ICD-10-CM | POA: Diagnosis not present

## 2024-01-08 DIAGNOSIS — D696 Thrombocytopenia, unspecified: Secondary | ICD-10-CM | POA: Diagnosis not present

## 2024-01-08 LAB — CBC
HCT: 24.1 % — ABNORMAL LOW (ref 36.0–46.0)
Hemoglobin: 7.9 g/dL — ABNORMAL LOW (ref 12.0–15.0)
MCH: 28.2 pg (ref 26.0–34.0)
MCHC: 32.8 g/dL (ref 30.0–36.0)
MCV: 86.1 fL (ref 80.0–100.0)
Platelets: 12 K/uL — CL (ref 150–400)
RBC: 2.8 MIL/uL — ABNORMAL LOW (ref 3.87–5.11)
RDW: 17.1 % — ABNORMAL HIGH (ref 11.5–15.5)
WBC: 69 K/uL (ref 4.0–10.5)
nRBC: 0 % (ref 0.0–0.2)

## 2024-01-08 LAB — COMPREHENSIVE METABOLIC PANEL WITH GFR
ALT: 51 U/L — ABNORMAL HIGH (ref 0–44)
AST: 38 U/L (ref 15–41)
Albumin: 2.9 g/dL — ABNORMAL LOW (ref 3.5–5.0)
Alkaline Phosphatase: 159 U/L — ABNORMAL HIGH (ref 38–126)
Anion gap: 10 (ref 5–15)
BUN: 21 mg/dL (ref 8–23)
CO2: 24 mmol/L (ref 22–32)
Calcium: 8.5 mg/dL — ABNORMAL LOW (ref 8.9–10.3)
Chloride: 97 mmol/L — ABNORMAL LOW (ref 98–111)
Creatinine, Ser: 0.65 mg/dL (ref 0.44–1.00)
GFR, Estimated: >60 mL/min (ref >60)
Glucose, Bld: 96 mg/dL (ref 70–99)
Potassium: 3.7 mmol/L (ref 3.5–5.1)
Sodium: 131 mmol/L — ABNORMAL LOW (ref 135–145)
Total Bilirubin: 0.4 mg/dL (ref 0.0–1.2)
Total Protein: 5.6 g/dL — ABNORMAL LOW (ref 6.5–8.1)

## 2024-01-08 LAB — MAGNESIUM: Magnesium: 2.1 mg/dL (ref 1.7–2.4)

## 2024-01-08 LAB — PATHOLOGIST SMEAR REVIEW

## 2024-01-08 MED ORDER — SODIUM CHLORIDE 1 G PO TABS
1.0000 g | ORAL_TABLET | Freq: Two times a day (BID) | ORAL | 0 refills | Status: DC
  Filled 2024-01-08: qty 60, 30d supply, fill #0

## 2024-01-08 MED ORDER — SENNOSIDES-DOCUSATE SODIUM 8.6-50 MG PO TABS: 1.0000 | ORAL_TABLET | Freq: Two times a day (BID) | ORAL | Status: DC | PRN

## 2024-01-08 MED ORDER — FUROSEMIDE 40 MG PO TABS
40.0000 mg | ORAL_TABLET | Freq: Every day | ORAL | 0 refills | Status: DC | PRN
  Filled 2024-01-08: qty 30, 30d supply, fill #0

## 2024-01-08 MED ORDER — LEVOFLOXACIN 750 MG PO TABS
750.0000 mg | ORAL_TABLET | Freq: Every day | ORAL | 0 refills | Status: AC
  Filled 2024-01-08: qty 15, 15d supply, fill #0

## 2024-01-08 MED ORDER — ACETAMINOPHEN 325 MG PO TABS: 650.0000 mg | ORAL_TABLET | Freq: Four times a day (QID) | ORAL | Status: DC | PRN

## 2024-01-08 MED ORDER — METRONIDAZOLE 500 MG PO TABS
500.0000 mg | ORAL_TABLET | Freq: Two times a day (BID) | ORAL | 0 refills | Status: AC
  Filled 2024-01-08: qty 30, 15d supply, fill #0

## 2024-01-08 MED ORDER — POTASSIUM CHLORIDE CRYS ER 20 MEQ PO TBCR
40.0000 meq | EXTENDED_RELEASE_TABLET | Freq: Once | ORAL | Status: AC
  Administered 2024-01-08: 40 meq via ORAL
  Filled 2024-01-08: qty 2

## 2024-01-08 NOTE — Progress Notes (Signed)
 Medication delivered to bedside and left with patient.

## 2024-01-08 NOTE — Discharge Summary (Signed)
 Physician Discharge Summary  [name] FMW:[id number] DOB: Dec 14, 1955 DOA: 01/05/2024  PCP: Melanee Annah BROCKS, MD  Admit date: 01/05/2024 Discharge date: 01/08/24  Admitted From: Home Disposition: Home  Recommendations for Outpatient Follow-up:  Oncology to arrange outpatient follow-up. Outpatient follow-up with palliative care Check CMP and CBC in 1 week Please follow up on the following pending results: None  Home Health: No need identified Equipment/Devices: Home oxygen, 2 L by [place]  Discharge Condition: Stable but poor prognosis CODE STATUS: DNR Diet Orders (From admission, onward)     Start     Ordered   01/05/24 0813  Diet regular Room service appropriate? Yes; Fluid consistency: Thin  Diet effective now       Question Answer Comment  Room service appropriate? Yes   Fluid consistency: Thin      01/05/24 0813             Follow-up Information     [facility] Follow up.   Why: [facility] will provide palliative care after discharge. Contact information: [address] [facility] [facility] [place]  [address] [phone]        Lanny Callander, MD. Schedule an appointment as soon as possible for a visit in 1 week(s).   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 Patient transfused 1 unit of PRBCs and 2 units of platelets. PCCM/pulmonary consulted patient underwent thoracentesis with removal of 500 cc exudative but culture negative fluid on 01/05/2024.  Per PCCM, concern for malignant effusion and possible empyema.  Not a candidate for intrapleural lytics or VATS.  Pulmonology recommended palliative and symptom management.  Oncology  consulted and evaluated patient and recommended hospice.  Palliative medicine consulted.  Patient prefers palliative follow-up so she can pursue blood transfusion as needed in the future.  On discharge, antibiotics de-escalated to p.o. Levaquin  and p.o. Flagyl for right pleural effusion.  Respiratory distress improved but she desaturated to 86% with ambulation on room air requiring 2 L by nasal cannula to recover to 90s.  She is discharged on home 2 L by nasal cannula.  See individual problem list below for more.   Problems addressed during this hospitalization AML-with recurrent anemia and thrombocytopenia: Seems hospitalized and transfused from 9/25-9/27.  Normally follows at [facility] but transferred her care locally, and seen by Dr.  Lanny.  Deemed to have very poor prognosis and home hospice was recommended.  However, patient is not interested in blood transfusion and desires to go home with palliative instead of hospice.  Hydroxyurea  discontinued due to sev thrombocytopenia. -Oncology recommends outpatient follow-up -Palliative medicine recommended outpatient follow-up since patient is not ready for home hospice   Acute hypoxic respiratory failure secondary to right-sided pleural effusion: S/p right thoracocentesis with removal of 500 cc exudative culture negative fluid.  Concern about malignant effusion.  Postthoracentesis x-ray without significant change. Per pulmonary patient opted not to undergo further procedures including chest tube placement and will not be a candidate for intrapleural lytics.  Pulmonary recommended palliative and symptom management. -De-escalate antibiotics to Levaquin  and Flagyl and continue for 15 more days for possible empyema -Continue supportive care with incentive spirometry, mucolytics, antitussive  and bronchodilators. - Home oxygen 2 L by nasal cannula  Chronic leukocytosis/severe thrombocytopenia/anemia: Due to #1.  Poor prognosis. -Per oncology-recommended  outpatient follow-up. -Palliative follow-up outpatient.   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  Stable. - Continue p.o. sodium chloride  tablets   Elevated liver enzymes: Likely due to #1.  Improved. - Recheck in 1 week   Left thigh/groin pain: Spasm?  Hypokalemia?  Improved. - Continue home tramadol  and Tylenol    Tobacco use disorder -Encouraged smoking cessation - Continue nicotine  patch   Hypokalemia: Resolved.    Body mass index is 24.58 kg/m.           Consultations: Oncology Critical care Palliative medicine   Time spent 35  minutes  Vital signs Vitals:   01/07/24 1954 01/08/24 0549 01/08/24 0808 01/08/24 1424  BP: (!) 120/53 (!) 119/56  (!) 120/58  Pulse: 91 86  83  Temp: 98.1 F (36.7 C) 98.5 F (36.9 C)  97.6 F (36.4 C)  Resp: 15 17  18   Height:      Weight:      SpO2: 94% 96% 96% 98%  TempSrc: Oral   Oral  BMI (Calculated):         Discharge exam  GENERAL: No apparent distress.  Nontoxic. HEENT: MMM.  Vision and hearing grossly intact.  NECK: Supple.  No apparent JVD.  RESP:  No IWOB.  Diminished aeration in right lung. CVS:  RRR. Heart sounds normal.  ABD/GI/GU: BS+. Abd soft, NTND.  MSK/EXT:  Moves extremities. No apparent deformity. No edema.  SKIN: no apparent skin lesion or wound NEURO: Awake and alert. Oriented appropriately.  No apparent focal neuro deficit. PSYCH: Calm. Normal affect.   Discharge Instructions Discharge Instructions     Discharge instructions   Complete by: As directed    It has been a pleasure taking care of you!  You were hospitalized due to leukemia and right pleural effusion (fluid around your lung).  You have been treated with blood transfusion for leukemia.  You had fluid drained from your right lung started on antibiotics for possible infection.  Your symptoms improved.  We are discharging you more antibiotics to complete treatment course.  Is very important that you complete the whole course of  antibiotics regardless of improvement.  Follow-up with oncologist per the recommendation.   Take care,   Increase activity slowly   Complete by: As directed       Allergies as of 01/08/2024       Reactions   Penicillin G Anaphylaxis   Has tolerated Rocephin  and Cefepime  on multiple occasions   Prednisone Anaphylaxis   Pt states it turned her into a monster, lips started swelling and couldn't breath   Codeine Nausea And Vomiting   Heparin  Other (See Comments)   Passed out         Medication List     STOP taking these medications    hydroxyurea  500 MG capsule Commonly known as: HYDREA        TAKE these medications    acetaminophen  325 MG tablet Commonly known as: TYLENOL  Take 2 tablets (650 mg total) by mouth every 6 (six) hours as needed for mild pain (pain score 1-3) or fever (or Fever >/= 101).   ALPRAZolam  0.5 MG tablet Commonly known as: XANAX  Take 0.5 mg by mouth 2 (two) times daily.   ascorbic acid  500 MG tablet Commonly known as: VITAMIN C  Take 500 mg by mouth daily.   b complex vitamins capsule Take 1 capsule by mouth daily.   cholecalciferol  10 MCG (400 UNIT) Tabs  tablet Commonly known as: VITAMIN D3 Take 400 Units by mouth daily.   furosemide 40 MG tablet Commonly known as: LASIX Take 1 tablet (40 mg total) by mouth daily as needed for up to 18 days for fluid or edema. What changed:  when to take this reasons to take this   hydrOXYzine  25 MG tablet Commonly known as: ATARAX  Take 25 mg by mouth 3 (three) times daily as needed for anxiety.   levofloxacin  750 MG tablet Commonly known as: LEVAQUIN  Take 1 tablet (750 mg total) by mouth daily for 15 days. Start taking on: January 09, 2024   metroNIDAZOLE 500 MG tablet Commonly known as: FLAGYL Take 1 tablet (500 mg total) by mouth every 12 (twelve) hours for 15 days.   nicotine  21 mg/24hr patch Commonly known as: NICODERM CQ  - dosed in mg/24 hours Place 1 patch (21 mg total) onto the skin  daily.   ondansetron  8 MG disintegrating tablet Commonly known as: ZOFRAN -ODT Take 8 mg by mouth every 8 (eight) hours as needed for nausea or vomiting.   pantoprazole  20 MG tablet Commonly known as: PROTONIX  TAKE ONE TABLET (20 MG TOTAL) BY MOUTH DAILY.   polyethylene glycol 17 g packet Commonly known as: MIRALAX  / GLYCOLAX  Take 17 g by mouth daily as needed for mild constipation.   prochlorperazine  10 MG tablet Commonly known as: COMPAZINE  Take 10 mg by mouth daily as needed for nausea or vomiting.   promethazine  25 MG tablet Commonly known as: PHENERGAN  Take 25 mg by mouth daily as needed for nausea or vomiting.   senna-docusate 8.6-50 MG tablet Commonly known as: Senokot-S Take 1-2 tablets by mouth 2 (two) times daily between meals as needed for mild constipation or moderate constipation.   sodium chloride  1 g tablet Take 1 tablet (1 g total) by mouth 2 (two) times daily with a meal.   traMADol  50 MG tablet Commonly known as: ULTRAM  Take 50 mg by mouth every 6 (six) hours as needed for moderate pain (pain score 4-6).               Durable Medical Equipment  (From admission, onward)           Start     Ordered   01/08/24 1452  For home use only DME oxygen  Once       Question Answer Comment  Length of Need 6 Months   Mode or (Route) Nasal cannula   Liters per Minute 2   Frequency Continuous (stationary and portable oxygen unit needed)   Oxygen delivery system Gas      01/08/24 1451             Procedures/Studies: Right thoracocentesis with removal of 500 cc exudative and culture negative fluid   DG CHEST PORT 1 VIEW Result Date: 01/06/2024 CLINICAL DATA:  Right pleural effusion. EXAM: PORTABLE CHEST 1 VIEW COMPARISON:  01/05/2024 FINDINGS: Similar appearance of large right pleural effusion with posterior loculated component better characterized on yesterday's CT. Small left effusion seen on yesterday's CT study not readily evident. The cardio  pericardial silhouette is enlarged. Right Port-A-Cath again noted. Bones are diffusely demineralized. IMPRESSION: No substantial change. Similar appearance of large right pleural effusion with posterior loculated component better characterized on yesterday's CT. Electronically Signed   By: Camellia Candle M.D.   On: 01/06/2024 05:25   DG Chest Port 1 View Result Date: 01/05/2024 CLINICAL DATA:  Status post thoracentesis. EXAM: PORTABLE CHEST 1 VIEW COMPARISON:  Chest radiograph dated  01/05/2024. FINDINGS: Right-sided Port-A-Cath in similar position. No significant interval change in the size of the right pleural effusion and associated atelectasis/infiltrate. Underlying mass is not excluded. Small left pleural effusion as seen previously. No identifiable pneumothorax. Stable cardiac silhouette no acute osseous pathology. IMPRESSION: No significant interval change since the prior radiograph. Electronically Signed   By: Vanetta Chou M.D.   On: 01/05/2024 15:49   CT CHEST W CONTRAST Result Date: 01/05/2024 CLINICAL DATA:  Pleural effusion, malignancy suspected Chest pain and shortness of breath since yesterday. EXAM: CT CHEST WITH CONTRAST TECHNIQUE: Multidetector CT imaging of the chest was performed during intravenous contrast administration. RADIATION DOSE REDUCTION: This exam was performed according to the departmental dose-optimization program which includes automated exposure control, adjustment of the mA and/or kV according to patient size and/or use of iterative reconstruction technique. CONTRAST:  75mL OMNIPAQUE  IOHEXOL  300 MG/ML  SOLN COMPARISON:  Chest CTA 01/02/2024, 09/24/2023 and 07/04/2023. FINDINGS: Cardiovascular: Accessed right IJ Port-A-Cath extends into the upper right atrium. No acute vascular findings are demonstrated. Mild atherosclerosis of the aorta, great vessels and coronary arteries. Calcifications of the aortic valve. The heart size is normal. There is no pericardial effusion.  Mediastinum/Nodes: Multiple enlarged mediastinal and hilar lymph nodes are again noted, similar to recent prior studies. Representative lymph nodes include a 2.0 cm right hilar node on image 28/3 and a 1.9 cm subcarinal node on image 28/3. No progressive adenopathy identified in the short interval from the previous chest CT. There are small axillary lymph nodes bilaterally. The thyroid gland, trachea and esophagus demonstrate no significant findings. Lungs/Pleura: Organizing and enlarging right pleural effusion is associated with thin pleural enhancement, suspicious for empyema. There is a small dependent left pleural effusion without suspicious pleural enhancement. Increasing compressive atelectasis dependently in the right upper, middle and lower lobes. Unchanged patchy airspace disease posteromedially in the left lower lobe. Upper abdomen: No acute findings are seen within the visualized upper abdomen. The spleen is moderately enlarged. There are mildly enlarged lymph nodes within the visualized upper abdomen, grossly stable. Musculoskeletal/Chest wall: There is no chest wall mass or suspicious osseous finding. Mild multilevel spondylosis. IMPRESSION: 1. Organizing and enlarging right pleural effusion with thin pleural enhancement, suspicious for empyema. Correlate clinically. Recommend thoracentesis. 2. Increasing compressive atelectasis dependently in the right upper, middle and lower lobes. 3. Stable small left pleural effusion with patchy airspace disease posteromedially in the left lower lobe. 4. 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 Given the pleural process, some of these lymph nodes could be reactive. Associated moderate splenomegaly. 5.  Aortic Atherosclerosis (ICD10-I70.0). Electronically Signed   By: Elsie Perone M.D.   On: 01/05/2024 10:38   DG Chest 2 View Result Date: 01/05/2024 CLINICAL DATA:  68 year old female  with chest pain.  Leukemia. EXAM: CHEST - 2 VIEW COMPARISON:  CTA chest 01/02/2024 and earlier. FINDINGS: PA and lateral views 0425 hours. Right chest Port-A-Cath is stable. Combined layering and loculated pleural effusions right greater than left with veiling opacity in the lungs. That on the right has progressed since 01/01/2024. No superimposed pneumothorax or pulmonary edema. No air bronchograms. Stable cardiac size and mediastinal contours - with mediastinal and hilar lymphadenopathy better demonstrated by CT. Visualized tracheal air column is within normal limits. No acute osseous abnormality identified. Stable cholecystectomy clips. Nonobstructed visible bowel gas pattern. IMPRESSION: Progressed appearance of Right lung combined layering and loculated pleural effusion since 01/01/2024 CT. Stable smaller left pleural effusion. Known  mediastinal lymphadenopathy. Electronically Signed   By: VEAR Hurst M.D.   On: 01/05/2024 05:01   CT Angio Chest PE W and/or Wo Contrast Result Date: 01/02/2024 CLINICAL DATA:  Chest pain radiating to the left neck. Shortness of breath, dizziness, lightheadedness, nausea/vomiting, and weakness. History of leukemia. EXAM: CT ANGIOGRAPHY CHEST CT ABDOMEN AND PELVIS WITH CONTRAST TECHNIQUE: Multidetector CT imaging of the chest was performed using the standard protocol during bolus administration of intravenous contrast. Multiplanar CT image reconstructions and MIPs were obtained to evaluate the vascular anatomy. Multidetector CT imaging of the abdomen and pelvis was performed using the standard protocol during bolus administration of intravenous contrast. RADIATION DOSE REDUCTION: This exam was performed according to the departmental dose-optimization program which includes automated exposure control, adjustment of the mA and/or kV according to patient size and/or use of iterative reconstruction technique. CONTRAST:  OMNIPAQUE  IOHEXOL  350 MG/ML SOLN COMPARISON:  CTA chest dated  09/24/2023 and CT abdomen/pelvis dated 05/17/2023 FINDINGS: CTA CHEST FINDINGS Cardiovascular: Satisfactory opacification of the pulmonary arteries to the segmental level. No evidence of pulmonary embolism. Normal heart size. No pericardial effusion. Mild thoracic aortic atherosclerosis. Mediastinum/Nodes: Mediastinal and bilateral perihilar lymphadenopathy progressive, including: --dominant 2.2 cm subcarinal node (image 87), progressive --19 mm short axis right Perihilar Node --11 mm short axis left axillary node, progressive Right chest port terminates at the cavoatrial junction. Lungs/Pleura: Moderate right pleural effusion, grossly unchanged, with associated compressive atelectasis in the right lower lobe. Small left pleural effusion, new, with associated dependent atelectasis. No pneumothorax. Musculoskeletal: Mild degenerative changes of the thoracic spine. Review of the MIP images confirms the above findings. CT ABDOMEN and PELVIS FINDINGS Hepatobiliary: No focal liver abnormality is seen. Status post cholecystectomy. Mild central intrahepatic and extrahepatic ductal prominence, postsurgical. Pancreas: Unremarkable. No pancreatic ductal dilatation or surrounding inflammatory changes. Spleen: Moderate splenomegaly, measuring 17.0 cm in maximal craniocaudal dimension, progressive. Adrenals/Urinary Tract: Adrenal glands are unremarkable. Kidneys are normal, without renal calculi, focal lesion, or hydronephrosis. Bladder is unremarkable. Stomach/Bowel: Stomach is within normal limits. Appendix appears normal (image 61). No evidence of bowel wall thickening, distention, or inflammatory changes. Vascular/Lymphatic: Atherosclerotic calcifications of the abdominal aorta and branch vessels, although patent. Progressive abdominopelvic lymphadenopathy, including: --15 mm short axis portacaval node (image 30) --13 mm short axis jejunal mesenteric node (image 45) --9 mm short axis left perirectal node (image 70)  Reproductive: Calcified uterine fibroids.  No adnexal masses. Other: No abdominopelvic ascites Musculoskeletal: Mild degenerative changes of the lumbar spine. Review of the MIP images confirms the above findings. IMPRESSION: No evidence of pulmonary embolism. Progressive thoracic and abdominopelvic lymphadenopathy, as above, compatible with the patient's known leukemia. Moderate splenomegaly, progressive. Moderate right pleural effusion, grossly unchanged. Small left pleural effusion, new. Aortic Atherosclerosis (ICD10-I70.0). Electronically Signed   By: Pinkie Pebbles M.D.   On: 01/02/2024 01:45   CT ABDOMEN PELVIS W CONTRAST Result Date: 01/02/2024 CLINICAL DATA:  Chest pain radiating to the left neck. Shortness of breath, dizziness, lightheadedness, nausea/vomiting, and weakness. History of leukemia. EXAM: CT ANGIOGRAPHY CHEST CT ABDOMEN AND PELVIS WITH CONTRAST TECHNIQUE: Multidetector CT imaging of the chest was performed using the standard protocol during bolus administration of intravenous contrast. Multiplanar CT image reconstructions and MIPs were obtained to evaluate the vascular anatomy. Multidetector CT imaging of the abdomen and pelvis was performed using the standard protocol during bolus administration of intravenous contrast. RADIATION DOSE REDUCTION: This exam was performed according to the departmental dose-optimization program which includes automated exposure control, adjustment of the mA  and/or kV according to patient size and/or use of iterative reconstruction technique. CONTRAST:  OMNIPAQUE  IOHEXOL  350 MG/ML SOLN COMPARISON:  CTA chest dated 09/24/2023 and CT abdomen/pelvis dated 05/17/2023 FINDINGS: CTA CHEST FINDINGS Cardiovascular: Satisfactory opacification of the pulmonary arteries to the segmental level. No evidence of pulmonary embolism. Normal heart size. No pericardial effusion. Mild thoracic aortic atherosclerosis. Mediastinum/Nodes: Mediastinal and bilateral perihilar  lymphadenopathy progressive, including: --dominant 2.2 cm subcarinal node (image 87), progressive --19 mm short axis right Perihilar Node --11 mm short axis left axillary node, progressive Right chest port terminates at the cavoatrial junction. Lungs/Pleura: Moderate right pleural effusion, grossly unchanged, with associated compressive atelectasis in the right lower lobe. Small left pleural effusion, new, with associated dependent atelectasis. No pneumothorax. Musculoskeletal: Mild degenerative changes of the thoracic spine. Review of the MIP images confirms the above findings. CT ABDOMEN and PELVIS FINDINGS Hepatobiliary: No focal liver abnormality is seen. Status post cholecystectomy. Mild central intrahepatic and extrahepatic ductal prominence, postsurgical. Pancreas: Unremarkable. No pancreatic ductal dilatation or surrounding inflammatory changes. Spleen: Moderate splenomegaly, measuring 17.0 cm in maximal craniocaudal dimension, progressive. Adrenals/Urinary Tract: Adrenal glands are unremarkable. Kidneys are normal, without renal calculi, focal lesion, or hydronephrosis. Bladder is unremarkable. Stomach/Bowel: Stomach is within normal limits. Appendix appears normal (image 61). No evidence of bowel wall thickening, distention, or inflammatory changes. Vascular/Lymphatic: Atherosclerotic calcifications of the abdominal aorta and branch vessels, although patent. Progressive abdominopelvic lymphadenopathy, including: --15 mm short axis portacaval node (image 30) --13 mm short axis jejunal mesenteric node (image 45) --9 mm short axis left perirectal node (image 70) Reproductive: Calcified uterine fibroids.  No adnexal masses. Other: No abdominopelvic ascites Musculoskeletal: Mild degenerative changes of the lumbar spine. Review of the MIP images confirms the above findings. IMPRESSION: No evidence of pulmonary embolism. Progressive thoracic and abdominopelvic lymphadenopathy, as above, compatible with the  patient's known leukemia. Moderate splenomegaly, progressive. Moderate right pleural effusion, grossly unchanged. Small left pleural effusion, new. Aortic Atherosclerosis (ICD10-I70.0). Electronically Signed   By: Pinkie Pebbles M.D.   On: 01/02/2024 01:45   DG Chest 2 View Result Date: 01/01/2024 CLINICAL DATA:  Chest pain. Central chest pain starting today with radiation to the left neck. Shortness of breath, dizziness, lightheadedness, nausea, vomiting, and weakness. EXAM: CHEST - 2 VIEW COMPARISON:  12/21/2023 FINDINGS: Power port type central venous catheter with tip over the cavoatrial junction region. No pneumothorax. Heart size and pulmonary vascularity are normal. Small bilateral pleural effusions with basilar atelectasis are similar to prior study. Mediastinal contours appear intact. Surgical clips in the right upper quadrant. Degenerative changes in the spine. IMPRESSION: Bilateral pleural effusions with basilar atelectasis, similar to prior study. Electronically Signed   By: Elsie Gravely M.D.   On: 01/01/2024 19:48   DG Chest 2 View Result Date: 12/21/2023 EXAM: 2 VIEW(S) XRAY OF THE CHEST 12/21/2023 04:52:11 AM COMPARISON: 09/26/2023 CLINICAL HISTORY: Chest pain. Complain of chest pain since Friday, shortness of breath present, progressively worse, feels like heaviness, nausea present as well. FINDINGS: LINES, TUBES AND DEVICES: Right IJ port catheter in place to distal SVC. LUNGS AND PLEURA: Chronic coarsened interstitial markings identified bilaterally. Bibasilar atelectasis or opacities. Small bilateral pleural effusions. HEART AND MEDIASTINUM: Heart size at upper limits of normal. BONES AND SOFT TISSUES: No acute osseous abnormality. IMPRESSION: 1. Bibasilar atelectasis and small bilateral pleural effusions. Electronically signed by: Waddell Calk MD 12/21/2023 05:38 AM EDT RP Workstation: [id number]       The results of significant diagnostics from this hospitalization (including  imaging, microbiology, ancillary and laboratory) are listed below for reference.     Microbiology: Recent Results (from the past 240 hours)  Resp panel by RT-PCR (RSV, Flu A&B, Covid) Anterior Nasal Swab     Status: None   Collection Time: 01/05/24  4:51 AM   Specimen: Anterior Nasal Swab  Result Value Ref Range Status   SARS Coronavirus 2 by RT PCR NEGATIVE NEGATIVE Final    Comment: (NOTE) SARS-CoV-2 target nucleic acids are NOT DETECTED.  The SARS-CoV-2 RNA is generally detectable in upper respiratory specimens during the acute phase of infection. The lowest concentration of SARS-CoV-2 viral copies this assay can detect is 138 copies/mL. A negative result does not preclude SARS-Cov-2 infection and should not be used as the sole basis for treatment or other patient management decisions. A negative result may occur with  improper specimen collection/handling, submission of specimen other than nasopharyngeal swab, presence of viral mutation(s) within the areas targeted by this assay, and inadequate number of viral copies(<138 copies/mL). A negative result must be combined with clinical observations, patient history, and epidemiological information. The expected result is Negative.  Fact Sheet for Patients:  BloggerCourse.com  Fact Sheet for Healthcare Providers:  SeriousBroker.it  This test is no t yet approved or cleared by the [place]  FDA and  has been authorized for detection and/or diagnosis of SARS-CoV-2 by FDA under an Emergency Use Authorization (EUA). This EUA will remain  in effect (meaning this test can be used) for the duration of the COVID-19 declaration under Section 564(b)(1) of the Act, 21 U.S.C.section 360bbb-3(b)(1), unless the authorization is terminated  or revoked sooner.       Influenza A by PCR NEGATIVE NEGATIVE Final   Influenza B by PCR NEGATIVE NEGATIVE Final    Comment: (NOTE) The Xpert  Xpress SARS-CoV-2/FLU/RSV plus assay is intended as an aid in the diagnosis of influenza from Nasopharyngeal swab specimens and should not be used as a sole basis for treatment. Nasal washings and aspirates are unacceptable for Xpert Xpress SARS-CoV-2/FLU/RSV testing.  Fact Sheet for Patients: BloggerCourse.com  Fact Sheet for Healthcare Providers: SeriousBroker.it  This test is not yet approved or cleared by the [place]  FDA and has been authorized for detection and/or diagnosis of SARS-CoV-2 by FDA under an Emergency Use Authorization (EUA). This EUA will remain in effect (meaning this test can be used) for the duration of the COVID-19 declaration under Section 564(b)(1) of the Act, 21 U.S.C. section 360bbb-3(b)(1), unless the authorization is terminated or revoked.     Resp Syncytial Virus by PCR NEGATIVE NEGATIVE Final    Comment: (NOTE) Fact Sheet for Patients: BloggerCourse.com  Fact Sheet for Healthcare Providers: SeriousBroker.it  This test is not yet approved or cleared by the [place]  FDA and has been authorized for detection and/or diagnosis of SARS-CoV-2 by FDA under an Emergency Use Authorization (EUA). This EUA will remain in effect (meaning this test can be used) for the duration of the COVID-19 declaration under Section 564(b)(1) of the Act, 21 U.S.C. section 360bbb-3(b)(1), unless the authorization is terminated or revoked.  Performed at [facility], [address] [place], [place] [address]   Body fluid culture w Gram Stain     Status: None (Preliminary result)   Collection Time: 01/05/24  5:42 PM   Specimen: Pleural Fluid  Result Value Ref Range Status   Specimen Description   Final    PLEURAL Performed at [facility], [address], [place], [place] [address]  Special Requests   Final     NONE Performed at [facility], [address] [place], [place] [address]    Gram Stain   Final    ABUNDANT WBC PRESENT, PREDOMINANTLY MONONUCLEAR NO ORGANISMS SEEN    Culture   Final    NO GROWTH 3 DAYS Performed at [name], [address], [place], [place] [address]    Report Status PENDING  Incomplete  MRSA Next Gen by PCR, Nasal     Status: None   Collection Time: 01/05/24  9:34 PM   Specimen: Nasal Mucosa; Nasal Swab  Result Value Ref Range Status   MRSA by PCR Next Gen NOT DETECTED NOT DETECTED Final    Comment: (NOTE) The GeneXpert MRSA Assay (FDA approved for NASAL specimens only), is one component of a comprehensive MRSA colonization surveillance program. It is not intended to diagnose MRSA infection nor to guide or monitor treatment for MRSA infections. Test performance is not FDA approved in patients less than 54 years old. Performed at [facility], [address], [place], [place] [address]      Labs:  CBC: Recent Labs  Lab 01/05/24 1329 01/06/24 0311 01/06/24 1637 01/07/24 0202 01/08/24 0319  WBC 89.5* 87.2* 69.4* 73.7* 69.0*  NEUTROABS  --   --  9.0* 5.9  --   HGB 7.2* 6.9* 8.4* 8.2* 7.9*  HCT 21.8* 20.7* 24.4* 23.6* 24.1*  MCV 88.3 88.5 85.3 85.2 86.1  PLT 21* 29* 22* 19* 12*   BMP &GFR Recent Labs  Lab 01/03/24 0419 01/05/24 0451 01/06/24 0311 01/07/24 0202 01/08/24 0319  NA 130* 126* 128* 129* 131*  K 3.6 3.4* 3.5 3.4* 3.7  CL 98 93* 94* 96* 97*  CO2 22 21* 23 23 24   GLUCOSE 102* 124* 104* 103* 96  BUN 10 15 18 21 21   CREATININE 0.64 0.80 0.90 0.69 0.65  CALCIUM 8.6* 8.9 8.7* 8.5* 8.5*  MG  --   --   --  2.1 2.1   Estimated Creatinine Clearance: 58.9 mL/min (by C-G formula based on SCr of 0.65 mg/dL). Liver & Pancreas: Recent Labs  Lab 01/01/24 1932 01/02/24 0430 01/05/24 0451 01/06/24 0311 01/07/24 0202 01/08/24 0319  AST 46* 35 37  --  44* 38  ALT 58* 48* 41  --  54* 51*   ALKPHOS 190* 162* 157*  --  132* 159*  BILITOT 0.5 0.5 0.5  --  0.5 0.4  PROT 6.8 6.2* 6.5 5.8* 5.6* 5.6*  ALBUMIN 3.7 3.4* 3.5  --  3.0* 2.9*   Recent Labs  Lab 01/01/24 1932  LIPASE 28   No results for input(s): AMMONIA in the last 168 hours. Diabetic: No results for input(s): HGBA1C in the last 72 hours. No results for input(s): GLUCAP in the last 168 hours. Cardiac Enzymes: No results for input(s): CKTOTAL, CKMB, CKMBINDEX, TROPONINI in the last 168 hours. No results for input(s): PROBNP in the last 8760 hours. Coagulation Profile: Recent Labs  Lab 01/05/24 1001  INR 1.3*   Thyroid Function Tests: No results for input(s): TSH, T4TOTAL, FREET4, T3FREE, THYROIDAB in the last 72 hours. Lipid Profile: No results for input(s): CHOL, HDL, LDLCALC, TRIG, CHOLHDL, LDLDIRECT in the last 72 hours. Anemia Panel: No results for input(s): VITAMINB12, FOLATE, FERRITIN, TIBC, IRON, RETICCTPCT in the last 72 hours. Urine analysis:    Component Value Date/Time   COLORURINE YELLOW 01/01/2024 2231   APPEARANCEUR HAZY (A) 01/01/2024 2231   LABSPEC 1.014  01/01/2024 2231   PHURINE 6.0 01/01/2024 2231   GLUCOSEU NEGATIVE 01/01/2024 2231   HGBUR SMALL (A) 01/01/2024 2231   BILIRUBINUR NEGATIVE 01/01/2024 2231   KETONESUR NEGATIVE 01/01/2024 2231   PROTEINUR NEGATIVE 01/01/2024 2231   UROBILINOGEN 0.2 09/04/2008 2146   NITRITE NEGATIVE 01/01/2024 2231   LEUKOCYTESUR NEGATIVE 01/01/2024 2231   Sepsis Labs: Invalid input(s): PROCALCITONIN, LACTICIDVEN   SIGNED:  Omnia Dollinger T Leianna Barga, MD  [facility] 01/08/2024, 3:15 PM

## 2024-01-08 NOTE — Progress Notes (Signed)
 Palliative-   Chart reviewed. Noted patient to be discharged today. She has been referred to [facility] for outpatient Palliative services.   Cassondra Stain, AGNP-C Palliative Medicine  No charge

## 2024-01-08 NOTE — TOC Transition Note (Signed)
 Transition of Care [facility]) - Discharge Note  Patient Details  Name: [name] MRN: [id number] Date of Birth: Sep 07, 1955  Transition of Care [facility]) CM/SW Contact:  Duwaine GORMAN Aran, LCSW Phone Number: 01/08/2024, 3:25 PM  Clinical Narrative: Patient will need to discharge home with home oxygen. Patient does not have a DME company preference. CSW made oxygen referral to [facility] with [employer]. [employer] to deliver travel tank to patient's room. Care management signing off.  Final next level of care: Home/Self Care Barriers to Discharge: Barriers Resolved  Patient Goals and CMS Choice Patient states their goals for this hospitalization and ongoing recovery are:: Home with palliative through [facility] CMS Medicare.gov Compare Post Acute Care list provided to:: Patient Choice offered to / list presented to : Patient  Discharge Plan and Services Additional resources added to the After Visit Summary for   In-house Referral: Clinical Social Work Post Acute Care Choice:  (Palliative care)          DME Arranged: Oxygen DME Agency: [employer] Date DME Agency Contacted: 01/08/24 Time DME Agency Contacted: [phone] Representative spoke with at DME Agency: Mitch  Social Drivers of Health (SDOH) Interventions SDOH Screenings   Food Insecurity: No Food Insecurity (01/05/2024)  Housing: Low Risk  (01/05/2024)  Transportation Needs: No Transportation Needs (01/05/2024)  Utilities: Not At Risk (01/05/2024)  Alcohol Screen: Low Risk  (03/22/2021)  Depression (PHQ2-9): Low Risk  (05/06/2023)  Financial Resource Strain: Low Risk  (06/05/2023)   Received from [facility]  Physical Activity: Insufficiently Active (03/22/2021)  Social Connections: Socially Isolated (01/05/2024)  Stress: No Stress Concern Present (03/22/2021)  Tobacco Use: High Risk (01/05/2024)  Health Literacy: Low Risk  (03/13/2023)   Received from [facility]   Readmission Risk Interventions    01/06/2024   12:09 PM 01/03/2024    2:46  PM 12/22/2023    8:33 AM  Readmission Risk Prevention Plan  Transportation Screening Complete Complete Complete  PCP or Specialist Appt within 3-5 Days  Complete   HRI or Home Care Consult  Complete Complete  Social Work Consult for Recovery Care Planning/Counseling  Complete Complete  Palliative Care Screening  Not Applicable Not Applicable  Medication Review Oceanographer) Complete Complete Complete  HRI or Home Care Consult Complete    SW Recovery Care/Counseling Consult Complete    Palliative Care Screening Complete    Skilled Nursing Facility Not Applicable

## 2024-01-08 NOTE — Progress Notes (Signed)
 AVS reviewed with patient. IV team deaccessing port. Patient states ride will be here around 5 and is about to get dressed.

## 2024-01-08 NOTE — Progress Notes (Addendum)
 SATURATION QUALIFICATIONS: (This note is used to comply with regulatory documentation for home oxygen)  Patient Saturations on Room Air at Rest =93 %  Patient Saturations on Room Air while Ambulating = 86%  Patient Saturations on 2 Liters of oxygen while Ambulating = 92%  Please briefly explain why patient needs home oxygen: Patient oxygen demand increases with exertion and mobility.

## 2024-01-08 NOTE — Progress Notes (Signed)
 Patient ambulated in the hallway room air and sustained oxygen saturation 84-89%. Patient says she is fine going home on Oxygen. MD informed of 02 need.

## 2024-01-09 ENCOUNTER — Other Ambulatory Visit: Payer: Self-pay

## 2024-01-09 ENCOUNTER — Other Ambulatory Visit: Payer: Self-pay | Admitting: Student

## 2024-01-09 DIAGNOSIS — F419 Anxiety disorder, unspecified: Secondary | ICD-10-CM

## 2024-01-09 DIAGNOSIS — J9601 Acute respiratory failure with hypoxia: Secondary | ICD-10-CM

## 2024-01-09 LAB — BPAM RBC
Blood Product Expiration Date: 202510152359
Blood Product Expiration Date: 202510152359
ISSUE DATE / TIME: 202509300844
Unit Type and Rh: 6200
Unit Type and Rh: 6200

## 2024-01-09 LAB — BODY FLUID CULTURE W GRAM STAIN: Culture: NO GROWTH

## 2024-01-09 LAB — TYPE AND SCREEN
ABO/RH(D): A POS
Antibody Screen: NEGATIVE
Unit division: 0
Unit division: 0

## 2024-01-09 MED ORDER — HYDROXYZINE HCL 25 MG PO TABS: 25.0000 mg | ORAL_TABLET | Freq: Three times a day (TID) | ORAL | 0 refills | Status: AC | PRN

## 2024-01-09 MED ORDER — IPRATROPIUM-ALBUTEROL 0.5-2.5 (3) MG/3ML IN SOLN: 3.0000 mL | Freq: Four times a day (QID) | RESPIRATORY_TRACT | 0 refills | Status: AC | PRN

## 2024-01-09 NOTE — Progress Notes (Signed)
 Patient called back requesting Nebs and refill on her Atarax . Sent Rx for DuoNebs and Atarax  to her pharmacy.  Patient stated she had nebulizer machine when she was in the hospital

## 2024-01-12 ENCOUNTER — Telehealth: Payer: Self-pay | Admitting: Hematology

## 2024-01-12 ENCOUNTER — Other Ambulatory Visit: Payer: Self-pay

## 2024-01-12 DIAGNOSIS — C9200 Acute myeloblastic leukemia, not having achieved remission: Secondary | ICD-10-CM

## 2024-01-12 DIAGNOSIS — D649 Anemia, unspecified: Secondary | ICD-10-CM

## 2024-01-12 DIAGNOSIS — D696 Thrombocytopenia, unspecified: Secondary | ICD-10-CM

## 2024-01-12 NOTE — Progress Notes (Signed)
 Called and spoke with patient's friend, Randine. She reports patient is very weak and in pain. Offered appointment at [facility] but they do not feel they would make it. Requested to cancel all appointments as they are being seen Thursday at [facility]. Offered support and provided contact information should they need assistance before Thursday. Randine is appreciative of care and will reach out with needs.

## 2024-01-12 NOTE — Progress Notes (Signed)
 Attempted to reach patient to discuss if she has transferred her care to [facility] of if she would like to be seen by Dr. Chandra tomorrow since she was just discharged from the hospital. No answer, VM left with callback.

## 2024-01-12 NOTE — Telephone Encounter (Signed)
 [name] has been scheduled and made aware of her follow up appointment scheduled for 10/9 at 9:30.

## 2024-01-13 ENCOUNTER — Inpatient Hospital Stay (HOSPITAL_COMMUNITY)
Admission: EM | Admit: 2024-01-13 | Discharge: 2024-01-18 | DRG: 834 | Disposition: A | Discharge status: Home / Self Care | Attending: Internal Medicine | Admitting: Internal Medicine

## 2024-01-13 ENCOUNTER — Other Ambulatory Visit: Payer: Self-pay

## 2024-01-13 ENCOUNTER — Emergency Department (HOSPITAL_COMMUNITY)

## 2024-01-13 ENCOUNTER — Telehealth: Payer: Self-pay

## 2024-01-13 ENCOUNTER — Encounter (HOSPITAL_COMMUNITY): Payer: Self-pay

## 2024-01-13 DIAGNOSIS — E876 Hypokalemia: Secondary | ICD-10-CM | POA: Diagnosis present

## 2024-01-13 DIAGNOSIS — J90 Pleural effusion, not elsewhere classified: Secondary | ICD-10-CM | POA: Diagnosis not present

## 2024-01-13 DIAGNOSIS — J9621 Acute and chronic respiratory failure with hypoxia: Secondary | ICD-10-CM | POA: Diagnosis present

## 2024-01-13 DIAGNOSIS — D649 Anemia, unspecified: Secondary | ICD-10-CM | POA: Diagnosis present

## 2024-01-13 DIAGNOSIS — D61818 Other pancytopenia: Secondary | ICD-10-CM | POA: Diagnosis present

## 2024-01-13 DIAGNOSIS — Z2821 Immunization not carried out because of patient refusal: Secondary | ICD-10-CM | POA: Diagnosis not present

## 2024-01-13 DIAGNOSIS — I700 Atherosclerosis of aorta: Secondary | ICD-10-CM | POA: Diagnosis present

## 2024-01-13 DIAGNOSIS — F17210 Nicotine dependence, cigarettes, uncomplicated: Secondary | ICD-10-CM | POA: Diagnosis present

## 2024-01-13 DIAGNOSIS — Z66 Do not resuscitate: Secondary | ICD-10-CM | POA: Diagnosis present

## 2024-01-13 DIAGNOSIS — Z880 Allergy status to penicillin: Secondary | ICD-10-CM | POA: Diagnosis not present

## 2024-01-13 DIAGNOSIS — C9210 Chronic myeloid leukemia, BCR/ABL-positive, not having achieved remission: Secondary | ICD-10-CM | POA: Diagnosis not present

## 2024-01-13 DIAGNOSIS — L89312 Pressure ulcer of right buttock, stage 2: Secondary | ICD-10-CM | POA: Diagnosis present

## 2024-01-13 DIAGNOSIS — R042 Hemoptysis: Secondary | ICD-10-CM | POA: Diagnosis present

## 2024-01-13 DIAGNOSIS — D696 Thrombocytopenia, unspecified: Secondary | ICD-10-CM | POA: Diagnosis not present

## 2024-01-13 DIAGNOSIS — Z515 Encounter for palliative care: Secondary | ICD-10-CM

## 2024-01-13 DIAGNOSIS — R0609 Other forms of dyspnea: Secondary | ICD-10-CM | POA: Diagnosis not present

## 2024-01-13 DIAGNOSIS — J910 Malignant pleural effusion: Secondary | ICD-10-CM | POA: Diagnosis present

## 2024-01-13 DIAGNOSIS — C9200 Acute myeloblastic leukemia, not having achieved remission: Secondary | ICD-10-CM | POA: Diagnosis present

## 2024-01-13 DIAGNOSIS — D630 Anemia in neoplastic disease: Secondary | ICD-10-CM | POA: Diagnosis present

## 2024-01-13 DIAGNOSIS — J9601 Acute respiratory failure with hypoxia: Secondary | ICD-10-CM | POA: Diagnosis present

## 2024-01-13 DIAGNOSIS — E222 Syndrome of inappropriate secretion of antidiuretic hormone: Secondary | ICD-10-CM | POA: Diagnosis present

## 2024-01-13 DIAGNOSIS — J811 Chronic pulmonary edema: Secondary | ICD-10-CM | POA: Diagnosis present

## 2024-01-13 DIAGNOSIS — D6959 Other secondary thrombocytopenia: Secondary | ICD-10-CM | POA: Diagnosis present

## 2024-01-13 DIAGNOSIS — Z91013 Allergy to seafood: Secondary | ICD-10-CM

## 2024-01-13 DIAGNOSIS — L8990 Pressure ulcer of unspecified site, unspecified stage: Secondary | ICD-10-CM | POA: Diagnosis present

## 2024-01-13 DIAGNOSIS — L89322 Pressure ulcer of left buttock, stage 2: Secondary | ICD-10-CM | POA: Diagnosis present

## 2024-01-13 DIAGNOSIS — D638 Anemia in other chronic diseases classified elsewhere: Secondary | ICD-10-CM | POA: Diagnosis not present

## 2024-01-13 DIAGNOSIS — R54 Age-related physical debility: Secondary | ICD-10-CM | POA: Diagnosis present

## 2024-01-13 DIAGNOSIS — E871 Hypo-osmolality and hyponatremia: Secondary | ICD-10-CM | POA: Diagnosis present

## 2024-01-13 LAB — RESP PANEL BY RT-PCR (RSV, FLU A&B, COVID)  RVPGX2
Influenza A by PCR: NEGATIVE
Influenza B by PCR: NEGATIVE
Resp Syncytial Virus by PCR: NEGATIVE
SARS Coronavirus 2 by RT PCR: NEGATIVE

## 2024-01-13 LAB — D-DIMER, QUANTITATIVE: D-Dimer, Quant: 2.42 ug{FEU}/mL — ABNORMAL HIGH (ref 0.00–0.50)

## 2024-01-13 LAB — CBC
HCT: 20.4 % — ABNORMAL LOW (ref 36.0–46.0)
Hemoglobin: 6.7 g/dL — CL (ref 12.0–15.0)
MCH: 28.6 pg (ref 26.0–34.0)
MCHC: 32.8 g/dL (ref 30.0–36.0)
MCV: 87.2 fL (ref 80.0–100.0)
Platelets: <5 K/uL — CL (ref 150–400)
RBC: 2.34 MIL/uL — ABNORMAL LOW (ref 3.87–5.11)
RDW: 16.2 % — ABNORMAL HIGH (ref 11.5–15.5)
WBC: 77.9 K/uL (ref 4.0–10.5)
nRBC: 0 % (ref 0.0–0.2)

## 2024-01-13 LAB — LACTIC ACID, PLASMA: Lactic Acid, Venous: 1.6 mmol/L (ref 0.5–1.9)

## 2024-01-13 LAB — BASIC METABOLIC PANEL WITH GFR
Anion gap: 10 (ref 5–15)
BUN: 13 mg/dL (ref 8–23)
CO2: 29 mmol/L (ref 22–32)
Calcium: 8.4 mg/dL — ABNORMAL LOW (ref 8.9–10.3)
Chloride: 93 mmol/L — ABNORMAL LOW (ref 98–111)
Creatinine, Ser: 0.69 mg/dL (ref 0.44–1.00)
GFR, Estimated: >60 mL/min (ref >60)
Glucose, Bld: 113 mg/dL — ABNORMAL HIGH (ref 70–99)
Potassium: 3.4 mmol/L — ABNORMAL LOW (ref 3.5–5.1)
Sodium: 132 mmol/L — ABNORMAL LOW (ref 135–145)

## 2024-01-13 LAB — TROPONIN T, HIGH SENSITIVITY: Troponin T High Sensitivity: <15 ng/L (ref 0–19)

## 2024-01-13 LAB — PREPARE RBC (CROSSMATCH)

## 2024-01-13 MED ORDER — PROCHLORPERAZINE EDISYLATE 10 MG/2ML IJ SOLN
5.0000 mg | Freq: Four times a day (QID) | INTRAMUSCULAR | Status: DC | PRN
  Administered 2024-01-14: 5 mg via INTRAVENOUS
  Filled 2024-01-13: qty 2

## 2024-01-13 MED ORDER — SODIUM CHLORIDE 0.9% IV SOLUTION
Freq: Once | INTRAVENOUS | Status: AC
Start: 2024-01-13 — End: 2024-01-14

## 2024-01-13 MED ORDER — OXYCODONE HCL 5 MG PO TABS: 5.0000 mg | ORAL_TABLET | Freq: Four times a day (QID) | ORAL | Status: DC | PRN

## 2024-01-13 MED ORDER — VANCOMYCIN HCL 1500 MG/300ML IV SOLN
1500.0000 mg | Freq: Once | INTRAVENOUS | Status: DC
Start: 2024-01-13 — End: 2024-01-14
  Filled 2024-01-13: qty 300

## 2024-01-13 MED ORDER — ONDANSETRON HCL 4 MG/2ML IJ SOLN
4.0000 mg | Freq: Once | INTRAMUSCULAR | Status: AC
  Administered 2024-01-13: 4 mg via INTRAVENOUS
  Filled 2024-01-13: qty 2

## 2024-01-13 MED ORDER — ORAL CARE MOUTH RINSE: 15.0000 mL | OROMUCOSAL | Status: DC | PRN

## 2024-01-13 MED ORDER — ACETAMINOPHEN 325 MG PO TABS
650.0000 mg | ORAL_TABLET | Freq: Four times a day (QID) | ORAL | Status: DC | PRN
  Administered 2024-01-13 – 2024-01-16 (×4): 650 mg via ORAL
  Filled 2024-01-13 (×6): qty 2

## 2024-01-13 MED ORDER — MORPHINE SULFATE (PF) 4 MG/ML IV SOLN
4.0000 mg | Freq: Once | INTRAVENOUS | Status: AC
  Administered 2024-01-13: 4 mg via INTRAVENOUS
  Filled 2024-01-13: qty 1

## 2024-01-13 MED ORDER — OXYCODONE HCL 5 MG PO TABS
5.0000 mg | ORAL_TABLET | Freq: Once | ORAL | Status: DC
  Filled 2024-01-13: qty 1

## 2024-01-13 MED ORDER — POLYETHYLENE GLYCOL 3350 17 G PO PACK: 17.0000 g | PACK | Freq: Every day | ORAL | Status: DC | PRN

## 2024-01-13 MED ORDER — CHLORHEXIDINE GLUCONATE CLOTH 2 % EX PADS
6.0000 | MEDICATED_PAD | Freq: Every day | CUTANEOUS | Status: DC
  Administered 2024-01-13 – 2024-01-17 (×4): 6 via TOPICAL

## 2024-01-13 MED ORDER — SODIUM CHLORIDE 0.9 % IV SOLN
2.0000 g | Freq: Two times a day (BID) | INTRAVENOUS | Status: DC
  Administered 2024-01-13 – 2024-01-16 (×6): 2 g via INTRAVENOUS
  Filled 2024-01-13 (×6): qty 12.5

## 2024-01-13 MED ORDER — VANCOMYCIN HCL 1250 MG/250ML IV SOLN: 1250.0000 mg | INTRAVENOUS | Status: DC

## 2024-01-13 MED ORDER — IOHEXOL 350 MG/ML SOLN
75.0000 mL | Freq: Once | INTRAVENOUS | Status: AC | PRN
  Administered 2024-01-13: 75 mL via INTRAVENOUS

## 2024-01-13 MED ORDER — MORPHINE SULFATE (PF) 2 MG/ML IV SOLN
2.0000 mg | INTRAVENOUS | Status: DC | PRN
  Administered 2024-01-13: 2 mg via INTRAVENOUS
  Filled 2024-01-13: qty 1

## 2024-01-13 MED ORDER — ALPRAZOLAM 0.25 MG PO TABS
0.2500 mg | ORAL_TABLET | Freq: Two times a day (BID) | ORAL | Status: DC | PRN
  Administered 2024-01-13 – 2024-01-14 (×3): 0.25 mg via ORAL
  Filled 2024-01-13 (×3): qty 1

## 2024-01-13 MED ORDER — HYDROMORPHONE HCL 1 MG/ML IJ SOLN: 0.5000 mg | INTRAMUSCULAR | Status: DC | PRN

## 2024-01-13 NOTE — ED Triage Notes (Signed)
 Pt reports with [facility] and chest pain that started up last night. Pt is on 2.5 L [place]. Pt reports coughing up blood. Pt has a right arm injury from her walker, pt has bruising and a skin tear.

## 2024-01-13 NOTE — Telephone Encounter (Signed)
 Received telephone call from patient's cousin, Tracie. Calling to make Dr. Lanny aware that patient is on her way back to [facility]. Caller reports patient fell and cut her arm and the wound will not stop bleeding + low O2 SAT. Let caller know that I would make Dr. Lanny aware.

## 2024-01-13 NOTE — ED Notes (Signed)
 Pt wants PORT accessed for blood work

## 2024-01-13 NOTE — ED Provider Notes (Signed)
 [place] EMERGENCY DEPARTMENT AT [facility] Provider Note   CSN: [id number] Arrival date & time: 01/13/24  1734     Patient presents with: Chest Pain   [name] is a 68 y.o. female with a history of thrombocytopenia, AML, presenting to the hospital with concern for persistent chest pain and shortness of breath.  Patient reports she has now coughing up blood for the past 24 hours.  Patient was hospitalized for this 1 week ago.  At that time she was noted to have a loculated right-sided pleural effusion and thrombocytopenia, for which she was transfused 1 unit of red blood cells and 2 units of platelets.  She underwent a thoracentesis removal of 500 cc of exudative blood culture negative fluid on September 29.  Concern for potential malignant effusion or empyema.  Not a candidate for intrapleural lytics or VATS.  On discharge antibiotics been de-escalated to p.o. Levaquin  and Flagyl, the patient was discharged home on 2 L nasal cannula.  Of note, the patient had a  CT PE study w/ no acute thrombosis in September on her last admission  Patient also notes that she had mechanical fall and scraped her right arm on her walker handle, and has had oozing from that site since then.   HPI     Prior to Admission medications   Medication Sig Start Date End Date Taking? Authorizing Provider  acetaminophen  (TYLENOL ) 325 MG tablet Take 2 tablets (650 mg total) by mouth every 6 (six) hours as needed for mild pain (pain score 1-3) or fever (or Fever >/= 101). 01/08/24  Yes Gonfa, Taye T, MD  ALPRAZolam  (XANAX ) 0.5 MG tablet Take 0.5 mg by mouth 2 (two) times daily as needed for anxiety.   Yes [provider]  ascorbic acid  (VITAMIN C ) 500 MG tablet Take 500 mg by mouth daily.   Yes [provider]  b complex vitamins capsule Take 1 capsule by mouth daily.   Yes [provider]  cholecalciferol  (VITAMIN D3) 10 MCG (400 UNIT) TABS tablet Take 400 Units by mouth  daily.   Yes [provider]  furosemide (LASIX) 40 MG tablet Take 1 tablet (40 mg total) by mouth daily as needed for up to 18 days for fluid or edema. 01/08/24 02/07/24 Yes Gonfa, Taye T, MD  hydrOXYzine  (ATARAX ) 25 MG tablet Take 1 tablet (25 mg total) by mouth 3 (three) times daily as needed for anxiety. 01/09/24 02/08/24 Yes Gonfa, Taye T, MD  ipratropium-albuterol  (DUONEB) 0.5-2.5 (3) MG/3ML SOLN Take 3 mLs by nebulization every 6 (six) hours as needed (Shortness of breath, cough and/or wheeze). 01/09/24 02/08/24 Yes Gonfa, Taye T, MD  levofloxacin  (LEVAQUIN ) 750 MG tablet Take 1 tablet (750 mg total) by mouth daily for 15 days. 01/09/24 01/24/24 Yes Gonfa, Taye T, MD  metroNIDAZOLE (FLAGYL) 500 MG tablet Take 1 tablet (500 mg total) by mouth every 12 (twelve) hours for 15 days. 01/08/24 01/23/24 Yes Gonfa, Taye T, MD  nicotine  (NICODERM CQ  - DOSED IN MG/24 HOURS) 21 mg/24hr patch Place 1 patch (21 mg total) onto the skin daily. 07/08/23  Yes Swayze, Ava, DO  ondansetron  (ZOFRAN ) 8 MG tablet Take 8 mg by mouth every 8 (eight) hours as needed for nausea or vomiting.   Yes [provider]  pantoprazole  (PROTONIX ) 20 MG tablet TAKE ONE TABLET (20 MG TOTAL) BY MOUTH DAILY. Patient taking differently: Take 20 mg by mouth daily before breakfast. 07/23/23  Yes Melanee Annah BROCKS, MD  prochlorperazine  (COMPAZINE ) 10 MG tablet Take 10 mg by mouth every 6 (six) hours as needed for nausea or vomiting. 12/16/23  Yes [provider]  promethazine  (PHENERGAN ) 25 MG tablet Take 25 mg by mouth every 6 (six) hours as needed for nausea. 12/15/23  Yes [provider]  traMADol  (ULTRAM ) 50 MG tablet Take 50 mg by mouth every 6 (six) hours as needed for moderate pain (pain score 4-6). 09/23/23 09/22/24 Yes [provider]  senna-docusate (SENOKOT-S) 8.6-50 MG tablet Take 1-2 tablets by mouth 2 (two) times daily between meals as needed for mild constipation or moderate constipation. Patient not  taking: Reported on 01/13/2024 01/08/24   Kathrin Mignon DASEN, MD  sodium chloride  1 g tablet Take 1 tablet (1 g total) by mouth 2 (two) times daily with a meal. Patient not taking: Reported on 01/13/2024 01/08/24   Gonfa, Taye T, MD    Allergies: Penicillin g, Prednisone, Shellfish allergy, Codeine, and Heparin     Review of Systems  Updated Vital Signs BP (!) 146/46 (BP Location: Left Arm)   Pulse 85   Temp 97.9 F (36.6 C) (Oral)   Resp (!) 30   SpO2 95%   Physical Exam Constitutional:      General: She is not in acute distress. HENT:     Head: Normocephalic and atraumatic.  Eyes:     Conjunctiva/sclera: Conjunctivae normal.     Pupils: Pupils are equal, round, and reactive to light.  Cardiovascular:     Rate and Rhythm: Normal rate and regular rhythm.  Pulmonary:     Effort: Pulmonary effort is normal. No respiratory distress.     Comments: Diminished breath sounds to right mid and lower lung field Abdominal:     General: There is no distension.     Tenderness: There is no abdominal tenderness.  Skin:    General: Skin is warm and dry.  Neurological:     General: No focal deficit present.     Mental Status: She is alert. Mental status is at baseline.  Psychiatric:        Mood and Affect: Mood normal.        Behavior: Behavior normal.     (all labs ordered are listed, but only abnormal results are displayed) Labs Reviewed  BASIC METABOLIC PANEL WITH GFR - Abnormal; Notable for the following components:      Result Value   Sodium 132 (*)    Potassium 3.4 (*)    Chloride 93 (*)    Glucose, Bld 113 (*)    Calcium 8.4 (*)    All other components within normal limits  CBC - Abnormal; Notable for the following components:   WBC 77.9 (*)    RBC 2.34 (*)    Hemoglobin 6.7 (*)    HCT 20.4 (*)    RDW 16.2 (*)    Platelets <5 (*)    All other components within normal limits  D-DIMER, QUANTITATIVE - Abnormal; Notable for the following components:   D-Dimer, Quant 2.42 (*)     All other components within normal limits  RESP PANEL BY RT-PCR (RSV, FLU A&B, COVID)  RVPGX2  MRSA NEXT GEN BY PCR, NASAL  LACTIC ACID, PLASMA  COMPREHENSIVE METABOLIC PANEL WITH GFR  MAGNESIUM  PHOSPHORUS  PROCALCITONIN  LACTIC ACID, PLASMA  PRO BRAIN NATRIURETIC PEPTIDE  CBC WITH DIFFERENTIAL/PLATELET  PREPARE RBC (CROSSMATCH)  PREPARE PLATELET PHERESIS  TYPE AND SCREEN  TROPONIN T, HIGH SENSITIVITY    EKG: EKG Interpretation Date/Time:  Tuesday January 13 2024 17:42:06 EDT Ventricular Rate:  98 PR Interval:  153 QRS Duration:  91 QT Interval:  358 QTC Calculation: 458 R Axis:   65  Text Interpretation: Sinus rhythm Low voltage, precordial leads Borderline T abnormalities, anterior leads Confirmed by Cottie Cough [phone]) on 01/13/2024 5:52:54 PM  Radiology: CT Angio Chest PE W and/or Wo Contrast Result Date: 01/13/2024 CLINICAL DATA:  Pulmonary embolus suspected with high probability. Chest pain and shortness of breath. EXAM: CT ANGIOGRAPHY CHEST WITH CONTRAST TECHNIQUE: Multidetector CT imaging of the chest was performed using the standard protocol during bolus administration of intravenous contrast. Multiplanar CT image reconstructions and MIPs were obtained to evaluate the vascular anatomy. RADIATION DOSE REDUCTION: This exam was performed according to the departmental dose-optimization program which includes automated exposure control, adjustment of the mA and/or kV according to patient size and/or use of iterative reconstruction technique. CONTRAST:  75mL OMNIPAQUE  IOHEXOL  350 MG/ML SOLN COMPARISON:  Chest radiograph 01/13/2024.  CT chest 01/05/2024 FINDINGS: Cardiovascular: Technically adequate study with good opacification of the central and segmental pulmonary arteries. No focal filling defects. No evidence of significant pulmonary embolus. Normal heart size. No pericardial effusion. Normal caliber thoracic aorta. No aortic dissection. Great vessel origins are patent.  Calcification of the aorta and coronary arteries. Mediastinum/Nodes: Esophagus is decompressed. Prominent mediastinal, hilar, and axillary lymphadenopathy. Largest aortopulmonary window lymph nodes measure 1.9 cm in short axis dimension. Pretracheal and right paratracheal lymph nodes measure up to 1.8 cm short axis dimension. Lymphadenopathy is similar to prior study and correspond to the patient's apparent history of leukemia. Thyroid gland is unremarkable. Right central venous catheter with tip at the cavoatrial junction. Lungs/Pleura: Large bilateral pleural effusions with possible loculation on the right. There is mild increase of pleural effusion since prior study. Areas of consolidation or atelectasis in the lung bases with patchy airspace disease in both lungs. Airspace changes are increasing since the prior study suggesting likely superimposed multifocal pneumonia or aspiration. Diffuse peribronchial thickening on the right is increasing since prior study. Trachea and mainstem bronchi are mildly flatten due to extrinsic compression from lymphadenopathy but appear patent. No pneumothorax. Upper Abdomen: Severe enlarged spleen.  Celiac axis lymphadenopathy. Musculoskeletal: Degenerative changes in the spine. No focal bone lesions. Review of the MIP images confirms the above findings. IMPRESSION: 1. No evidence of significant pulmonary embolus. 2. Mediastinal, hilar, axillary, and celiac axis lymphadenopathy is similar to prior study and likely related to history of leukemia. 3. Large bilateral pleural effusions with progression since prior study. Compressive atelectasis or consolidation in the lung bases. Developing patchy airspace disease in both lungs likely representing superimposed pneumonia or aspiration. 4. Markedly enlarged spleen likely related to leukemia. 5. Aortic atherosclerosis. Electronically Signed   By: Elsie Gravely M.D.   On: 01/13/2024 19:57   DG Chest 2 View Result Date:  01/13/2024 CLINICAL DATA:  Chest pain and shortness of breath. EXAM: CHEST - 2 VIEW COMPARISON:  January 06, 2024. FINDINGS: Stable cardiomediastinal silhouette. Right internal jugular Port-A-Cath is unchanged. Stable large loculated right pleural effusion is noted with associated right basilar atelectasis. Minimal left pleural effusion is noted. Bony thorax is unremarkable. IMPRESSION: Stable large loculated right pleural effusion is noted with associated right basilar atelectasis. Minimal left pleural effusion is noted. Electronically Signed   By: Lynwood Landy Raddle M.D.   On: 01/13/2024 18:24     Procedures   Medications Ordered in the ED  ceFEPIme  (MAXIPIME ) 2 g in sodium chloride  0.9 % 100 mL  IVPB (2 g Intravenous New Bag/Given 01/13/24 2144)  acetaminophen  (TYLENOL ) tablet 650 mg (has no administration in time range)  prochlorperazine  (COMPAZINE ) injection 5 mg (has no administration in time range)  polyethylene glycol (MIRALAX  / GLYCOLAX ) packet 17 g (has no administration in time range)  oxyCODONE  (Oxy IR/ROXICODONE ) immediate release tablet 5 mg (has no administration in time range)  HYDROmorphone  (DILAUDID ) injection 0.5 mg (has no administration in time range)  vancomycin  (VANCOREADY) IVPB 1500 mg/300 mL (has no administration in time range)  vancomycin  (VANCOREADY) IVPB 1250 mg/250 mL (has no administration in time range)  Chlorhexidine  Gluconate Cloth 2 % PADS 6 each (6 each Topical Given 01/13/24 2300)  Oral care mouth rinse (has no administration in time range)  ALPRAZolam  (XANAX ) tablet 0.25 mg (has no administration in time range)  morphine  (PF) 4 MG/ML injection 4 mg (4 mg Intravenous Given 01/13/24 1855)  ondansetron  (ZOFRAN ) injection 4 mg (4 mg Intravenous Given 01/13/24 1856)  iohexol  (OMNIPAQUE ) 350 MG/ML injection 75 mL (75 mLs Intravenous Contrast Given 01/13/24 1924)  morphine  (PF) 4 MG/ML injection 4 mg (4 mg Intravenous Given 01/13/24 2048)  0.9 %  sodium chloride  infusion  (Manually program via Guardrails IV Fluids) ( Intravenous New Bag/Given 01/13/24 2305)    Clinical Course as of 01/13/24 2314  Tue Jan 13, 2024  2029 Patient appears unfazed with initial morphine  dose and still having pleuritic chest pain, requesting more pain medicine, she is awake and breathing comfortably, I have ordered additional pain medicine to be given [MT]  2029 Consented pt and family for transfusions [MT]  2147 Pulm consulted nonemergently, will see pt tomorrow - if unstable can reconsult tonight.  Recommending broadening antibiotics overnight [MT]    Clinical Course User Index [MT] Missy Baksh, Donnice PARAS, MD                                 Medical Decision Making Amount and/or Complexity of Data Reviewed Labs: ordered. Radiology: ordered.  Risk Prescription drug management. Decision regarding hospitalization.   This patient presents to the ED with concern for hemoptysis, shortness of breath, chest pain. This involves an extensive number of treatment options, and is a complaint that carries with it a high risk of complications and morbidity.  The differential diagnosis includes lung infection versus loculated pleural effusion versus pulmonary embolism versus pneumothorax versus atypical ACS versus other  Viral illness including influenza and COVID-19 are also on the differential  Co-morbidities that complicate the patient evaluation: History of complex pleural effusion, malignancy, risk of thrombosis  Additional history obtained from patient's daughter at bedside   I ordered and personally interpreted labs.  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  D-dimer elevated.  Troponin undetectable.  COVID and flu negative  I ordered imaging studies including x-ray of the chest, CT PE I independently visualized and interpreted imaging which showed bilateral pleural effusions with loculated effusion on the right side I agree  with the radiologist interpretation  The patient was maintained on a cardiac monitor.  I personally viewed and interpreted the cardiac monitored which showed an underlying rhythm of: Sinus rhythm  Per my interpretation the patient's ECG shows sinus rhythm no acute ischemic findings  I ordered medication including blood cell transfusion, platelet transfusion, morphine  for pain, Zofran  for nausea  I have reviewed the patients home medicines and have made adjustments as needed   After the interventions noted  above, I reevaluated the patient and found that they have: improved    Dispostion:  After consideration of the diagnostic results and the patients response to treatment, I feel that the patient would benefit from medical admission      Final diagnoses:  Pleural effusion    ED Discharge Orders     None          Aarini Slee, Donnice PARAS, MD 01/13/24 2314

## 2024-01-13 NOTE — Progress Notes (Signed)
 eLink Physician-Brief Progress Note Patient Name: [name] DOB: [birth year] MRN: [id number]   Date of Service  01/13/2024  HPI/Events of Note  68 year old female with a history of thrombocytopenia in the setting of AML presents with chest pain and shortness of breath after recent admission with similar right-sided pleural effusions and respiratory failure.  Admitted with pancytopenia and respiratory failure.  Vital signs with mild tachypnea saturating 90% on 3 L nasal cannula. results with mild electrolyte disturbances, leukocytosis with anemia and severe thrombocytopenia.  Bilateral pleural effusions present without evidence of PE associated with compressive atelectasis.  eICU Interventions  Broad-spectrum antibiotics of vancomycin  and cefepime   Pneumonia workup pending  Transfuse to keep platelets above 10 or 20 with bleeding, hemoglobin greater than 7  Ongoing palliative discussions  DVT chemoprophylaxis contraindicated, maintain SCDs GI prophylaxis not indicated     Intervention Category Evaluation Type: New Patient Evaluation  [name] 01/13/2024, 10:53 PM

## 2024-01-13 NOTE — ED Notes (Signed)
 Date and time results received: 01/13/24 8:02 PM  (use smartphrase .now to insert current time)  Test: WBC Critical Value: 77.9  Test: Hemoglobin  Critical: 6.7  Test: Platelets  Critical: <5  Name of Provider Notified: Trifan   Orders Received? Or Actions Taken?: See MAR

## 2024-01-13 NOTE — Progress Notes (Signed)
 Pharmacy Antibiotic Note  [name] is a 68 y.o. female admitted on 01/13/2024 with pneumonia.  PMH significant for AML, thrombocypoenia.  Compaints of shortness of breath, chest pain and coughing up blood.  CTAngio noted large bilateral pleural effusions with progression and negative for PE.  Recent hospitalization 1 week ago and was discharged on levaquin  and flagyl.  Pharmacy has been consulted for Vancomycin  dosing.  Plan: Vancomycin  1500mg  IV x 1 followed by Vancomycin  1250 mg IV Q 24 hrs. Goal AUC 400-550. Expected AUC: 486.4  SCr used: 0.8 (rounded up from 0.69) Cefepime  2g IV q12h Follow renal function    Temp (24hrs), Avg:98.4 F (36.9 C), Min:98.4 F (36.9 C), Max:98.4 F (36.9 C)  Recent Labs  Lab 01/07/24 0202 01/08/24 0319 01/13/24 1834  WBC 73.7* 69.0* 77.9*  CREATININE 0.69 0.65 0.69    Estimated Creatinine Clearance: 58.9 mL/min (by C-G formula based on SCr of 0.69 mg/dL).    Allergies  Allergen Reactions   Penicillin G Anaphylaxis and Other (See Comments)    Has tolerated Rocephin  and Cefepime  on multiple occasions   Prednisone Anaphylaxis, Shortness Of Breath, Swelling and Other (See Comments)    Pt states it turned her into a monster, lips started swelling and couldn't breathe   Shellfish Allergy Anaphylaxis and Swelling   Codeine Nausea And Vomiting   Heparin  Other (See Comments)    Passed out     Antimicrobials this admission: 10/7 Vancomycin  >>   10/7 Cefepime  >>    Dose adjustments this admission:    Microbiology results:    Thank you for allowing pharmacy to be a part of this patient's care.  Arvin Gauss, PharmD 01/13/2024 10:08 PM

## 2024-01-13 NOTE — H&P (Incomplete)
 History and Physical  [name] FMW:[id number] DOB: [birth year] DOA: 01/13/2024  Referring physician: Dr. Cottie, EDP  PCP: Lanny Callander, MD  Outpatient Specialists: Oncology. Patient coming from: Home.  Chief Complaint: Cough, shortness of breath.  HPI: [name] is a 68 y.o. 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  Started with mild hemoptysis.  During her previous admission she was discharged on Levaquin  and Flagyl for 15 days for possible empyema.  In the ER, tachypneic with respiratory rate in the 30s.  Hypoxic requiring 4 L nasal cannula.  Hemoglobin 6.7 and platelet count less than 5.  2 units of platelets and 1 unit of RBCs ordered to be transfused in the ER.  CT angio chest was negative for pulmonary embolism however it showed mediastinal, hilar, axillary, celiac axis lymphadenopathy similar to prior study and likely related to history of leukemia.  Large bilateral pleural effusions with progression since prior study.  Compressive atelectasis or consolidation in the lung bases.  Developing patchy airspace disease in both lungs likely representing superimposed pneumonia or aspiration.  Markedly enlarged spleen likely related to leukemia.  Aortic atherosclerosis.  The patient was started on broad-spectrum IV antibiotics IV vancomycin  and cefepime  due to concern for pneumonia.  EDP discussed the case with pulmonary who will see in consultation.  ED Course: Temperature 97.7.  BP 131/54, pulse 89, respiration rate 29, O2 saturation 95% on 4 L.  Lab studies notable for WBC 77.9, hemoglobin 6.7, MCV 87, platelet count less than 5.  Serum sodium 132, potassium 3.4, glucose 113, calcium 8.4.   ProBNP 198.  Review of Systems: Review of systems as noted in the HPI. All other systems reviewed and are negative.   Past Medical History:  Diagnosis Date   AML (acute myeloid leukemia) (HCC)    Anemia    Aortic atherosclerosis    Chronic hyponatremia    GERD (gastroesophageal reflux disease)    Thrombocytopenia    Past Surgical History:  Procedure Laterality Date   CARDIAC SURGERY     CHOLECYSTECTOMY     HIP ARTHROSCOPY     IR IMAGING GUIDED PORT INSERTION  05/19/2023   surgery on left foot Left     Social History:  reports that she has been smoking cigarettes. She has a 1 pack-year smoking history. She has never used smokeless tobacco. She reports that she does not currently use alcohol. She reports that she does not currently use drugs.   Allergies  Allergen Reactions   Penicillin G Anaphylaxis and Other (See Comments)    Has tolerated Rocephin  and Cefepime  on multiple occasions   Prednisone Anaphylaxis, Shortness Of Breath, Swelling and Other (See Comments)    Pt states it turned her into a monster, lips started swelling and couldn't breathe   Shellfish Allergy Anaphylaxis and Swelling   Codeine Nausea And Vomiting   Heparin  Other (See Comments)    Passed out     Family History  Problem Relation Age of Onset   Cancer Mother    Heart failure Father    Heart failure Sister    Cancer Maternal Grandmother    Cancer Maternal Grandfather       Prior to Admission medications   Medication Sig Start Date End Date Taking? Authorizing Provider  levofloxacin  (LEVAQUIN ) 250 MG tablet Take 250 mg by mouth daily. 01/11/24  Yes [provider]  acetaminophen  (TYLENOL ) 325 MG tablet Take 2 tablets (650 mg total) by mouth every 6 (six) hours as needed for mild pain (pain score 1-3) or fever (or Fever >/= 101). 01/08/24   Gonfa, Taye T, MD  ALPRAZolam  (XANAX ) 0.5 MG tablet Take 0.5 mg by mouth 2 (two) times daily.    [provider]  ascorbic acid  (VITAMIN C )  500 MG tablet Take 500 mg by mouth daily.    [provider]  b complex vitamins capsule Take 1 capsule by mouth daily.    [provider]  cholecalciferol  (VITAMIN D3) 10 MCG (400 UNIT) TABS tablet Take 400 Units by mouth daily.    [provider]  furosemide (LASIX) 40 MG tablet Take 1 tablet (40 mg total) by mouth daily as needed for up to 18 days for fluid or edema. 01/08/24 02/07/24  Gonfa, Taye T, MD  hydrOXYzine  (ATARAX ) 25 MG tablet Take 1 tablet (25 mg total) by mouth 3 (three) times daily as needed for anxiety. 01/09/24 02/08/24  Gonfa, Taye T, MD  ipratropium-albuterol  (DUONEB) 0.5-2.5 (3) MG/3ML SOLN Take 3 mLs by nebulization every 6 (six) hours as needed (Shortness of breath, cough and/or wheeze). 01/09/24 02/08/24  Gonfa, Taye T, MD  levofloxacin  (LEVAQUIN ) 750 MG tablet Take 1 tablet (750 mg total) by mouth daily for 15 days. 01/09/24 01/24/24  Gonfa, Taye T, MD  metroNIDAZOLE (FLAGYL) 500 MG tablet Take 1 tablet (500 mg total) by mouth every 12 (twelve) hours for 15 days. 01/08/24 01/23/24  Gonfa, Taye T, MD  nicotine  (NICODERM CQ  - DOSED IN MG/24 HOURS) 21 mg/24hr patch Place 1 patch (21 mg total) onto the skin daily. 07/08/23   Swayze, Ava, DO  pantoprazole  (PROTONIX ) 20 MG tablet TAKE ONE TABLET (20 MG TOTAL) BY MOUTH DAILY. 07/23/23   Rao, Archana C, MD  polyethylene glycol (MIRALAX  / GLYCOLAX ) 17 g packet Take 17 g by mouth daily as needed for mild constipation.    [provider]  prochlorperazine  (COMPAZINE ) 10 MG tablet Take 10 mg by mouth daily as needed for nausea or vomiting. 12/16/23   [provider]  promethazine  (PHENERGAN ) 25 MG tablet Take 25 mg by mouth daily as needed for nausea or vomiting. 12/15/23   [provider]  senna-docusate (SENOKOT-S) 8.6-50 MG tablet Take 1-2 tablets by mouth 2 (two) times daily between meals as needed for mild constipation or moderate constipation. 01/08/24   Gonfa, Taye T, MD  sodium chloride  1 g  tablet Take 1 tablet (1 g total) by mouth 2 (two) times daily with a meal. 01/08/24   Gonfa, Mignon DASEN, MD  traMADol  (ULTRAM ) 50 MG tablet Take 50 mg by mouth every 6 (six) hours as needed for moderate pain (pain score 4-6). 09/23/23 09/22/24  [provider]    Physical Exam: BP (!) 122/57   Pulse 85   Temp 98.4 F (36.9 C) (Oral)   Resp 18   SpO2 92%   General: 68 y.o. year-old female well developed well nourished in no acute distress.  Alert and oriented x3. Cardiovascular: Regular rate and rhythm with no rubs or gallops.  No thyromegaly or JVD noted.  No lower extremity edema. 2/4 pulses in all 4 extremities. Respiratory: Mild rales at bases.  Poor inspiratory effort. Abdomen: Soft nontender nondistended with normal bowel sounds x4 quadrants. Muskuloskeletal: No cyanosis, clubbing or edema noted bilaterally Neuro:  CN II-XII intact, strength, sensation, reflexes Skin: No ulcerative lesions noted or rashes Psychiatry: Judgement and insight appear normal. Mood is appropriate for condition and setting          Labs on Admission:  Basic Metabolic Panel: Recent Labs  Lab 01/07/24 0202 01/08/24 0319 01/13/24 1834  NA 129* 131* 132*  K 3.4* 3.7 3.4*  CL 96* 97* 93*  CO2 23 24 29   GLUCOSE 103* 96 113*  BUN 21 21 13   CREATININE 0.69 0.65 0.69  CALCIUM 8.5* 8.5* 8.4*  MG 2.1 2.1  --    Liver Function Tests: Recent Labs  Lab 01/07/24 0202 01/08/24 0319  AST 44* 38  ALT 54* 51*  ALKPHOS 132* 159*  BILITOT 0.5 0.4  PROT 5.6* 5.6*  ALBUMIN 3.0* 2.9*   No results for input(s): LIPASE, AMYLASE in the last 168 hours. No results for input(s): AMMONIA in the last 168 hours. CBC: Recent Labs  Lab 01/07/24 0202 01/08/24 0319 01/13/24 1834  WBC 73.7* 69.0* 77.9*  NEUTROABS 5.9  --   --   HGB 8.2* 7.9* 6.7*  HCT 23.6* 24.1* 20.4*  MCV 85.2 86.1 87.2  PLT 19* 12* <5*   Cardiac Enzymes: No results for input(s): CKTOTAL, CKMB, CKMBINDEX, TROPONINI in the  last 168 hours.  BNP (last 3 results) No results for input(s): BNP in the last 8760 hours.  ProBNP (last 3 results) No results for input(s): PROBNP in the last 8760 hours.  CBG: No results for input(s): GLUCAP in the last 168 hours.  Radiological Exams on Admission: CT Angio Chest PE W and/or Wo Contrast Result Date: 01/13/2024 CLINICAL DATA:  Pulmonary embolus suspected with high probability. Chest pain and shortness of breath. EXAM: CT ANGIOGRAPHY CHEST WITH CONTRAST TECHNIQUE: Multidetector CT imaging of the chest was performed using the standard protocol during bolus administration of intravenous contrast. Multiplanar CT image reconstructions and MIPs were obtained to evaluate the vascular anatomy. RADIATION DOSE REDUCTION: This exam was performed according to the departmental dose-optimization program which includes automated exposure control, adjustment of the mA and/or kV according to patient size and/or use of iterative reconstruction technique. CONTRAST:  75mL OMNIPAQUE  IOHEXOL  350 MG/ML SOLN COMPARISON:  Chest radiograph 01/13/2024.  CT chest 01/05/2024 FINDINGS: Cardiovascular: Technically adequate study with good opacification of the central and segmental pulmonary arteries. No focal filling defects. No evidence of significant pulmonary embolus. Normal heart size. No pericardial effusion. Normal caliber thoracic aorta. No aortic dissection. Great vessel origins are patent. Calcification of the aorta and coronary arteries. Mediastinum/Nodes: Esophagus is decompressed. Prominent mediastinal, hilar, and axillary lymphadenopathy. Largest aortopulmonary window lymph nodes measure 1.9 cm in short axis dimension. Pretracheal and right paratracheal lymph nodes measure up to 1.8 cm short axis dimension. Lymphadenopathy is similar to prior study and correspond to the patient's apparent history of leukemia. Thyroid gland is unremarkable. Right central venous catheter with tip at the cavoatrial  junction. Lungs/Pleura: Large bilateral pleural effusions with possible loculation on the right. There is mild increase of pleural effusion since prior study. Areas of consolidation or atelectasis in the lung bases with patchy airspace disease in both lungs. Airspace changes are increasing since the prior study suggesting likely superimposed multifocal pneumonia or aspiration. Diffuse peribronchial thickening on the right is increasing since prior study. Trachea and mainstem bronchi are mildly flatten due to extrinsic compression from lymphadenopathy but appear patent. No pneumothorax. Upper Abdomen: Severe enlarged spleen.  Celiac axis lymphadenopathy. Musculoskeletal: Degenerative changes in the spine. No focal bone lesions.  Review of the MIP images confirms the above findings. IMPRESSION: 1. No evidence of significant pulmonary embolus. 2. Mediastinal, hilar, axillary, and celiac axis lymphadenopathy is similar to prior study and likely related to history of leukemia. 3. Large bilateral pleural effusions with progression since prior study. Compressive atelectasis or consolidation in the lung bases. Developing patchy airspace disease in both lungs likely representing superimposed pneumonia or aspiration. 4. Markedly enlarged spleen likely related to leukemia. 5. Aortic atherosclerosis. Electronically Signed   By: Elsie Gravely M.D.   On: 01/13/2024 19:57   DG Chest 2 View Result Date: 01/13/2024 CLINICAL DATA:  Chest pain and shortness of breath. EXAM: CHEST - 2 VIEW COMPARISON:  January 06, 2024. FINDINGS: Stable cardiomediastinal silhouette. Right internal jugular Port-A-Cath is unchanged. Stable large loculated right pleural effusion is noted with associated right basilar atelectasis. Minimal left pleural effusion is noted. Bony thorax is unremarkable. IMPRESSION: Stable large loculated right pleural effusion is noted with associated right basilar atelectasis. Minimal left pleural effusion is noted.  Electronically Signed   By: Lynwood Landy Raddle M.D.   On: 01/13/2024 18:24    EKG: I independently viewed the EKG done and my findings are as followed: Sinus rhythm rate of 98.  Nonspecific ST-T changes.  QTc 458.  Assessment/Plan Present on Admission:  Pleural effusion  Principal Problem:   Pleural effusion  Bilateral large pleural effusion right greater than left, POA. Pulmonary was consulted by EDP for drainage of the fluid and possible chest tube placement Maintain O2 saturation above 92% Early mobilization as tolerated  Multifocal, HCAP, POA Seen on CT scan Continue broad-spectrum IV antibiotics IV cefepime  and IV vancomycin  Monitor fever curve and WBCs Follow MRSA screening test and baseline procalcitonin.  Hypokalemia Serum potassium 3.4 Repleted Obtain magnesium level.  AML Outpatient close follow-up with providers.  Anemia of chronic disease Hemoglobin 6.7 1 unit PRBCs ordered to be transfused. Repeat CBC post posttransfusion  Severe thrombocytopenia Platelet count less than 5 2 units of platelets ordered to be transfused. Repeat CBC posttransfusion.  Generalized weakness PT OT evaluation Fall precautions.   Critical care time: 55 minutes.   DVT prophylaxis: SCDs.  Code Status: DNR.  Family Communication: Granddaughter at bedside.  Disposition Plan: Admitted to stepdown unit.  Consults called: Pulmonary.  Admission status: Inpatient status.   Status is: Inpatient The patient requires at least 2 midnights for further evaluation and treatment of present condition.   Terry LOISE Hurst MD [facility] Pager [phone]  If 7PM-7AM, please contact night-coverage [email or web address] Password [facility]  01/13/2024, 9:44 PM

## 2024-01-14 ENCOUNTER — Inpatient Hospital Stay (HOSPITAL_COMMUNITY)

## 2024-01-14 DIAGNOSIS — J90 Pleural effusion, not elsewhere classified: Secondary | ICD-10-CM | POA: Diagnosis not present

## 2024-01-14 DIAGNOSIS — C9210 Chronic myeloid leukemia, BCR/ABL-positive, not having achieved remission: Secondary | ICD-10-CM

## 2024-01-14 DIAGNOSIS — R0609 Other forms of dyspnea: Secondary | ICD-10-CM | POA: Diagnosis not present

## 2024-01-14 DIAGNOSIS — J9621 Acute and chronic respiratory failure with hypoxia: Secondary | ICD-10-CM

## 2024-01-14 DIAGNOSIS — R042 Hemoptysis: Secondary | ICD-10-CM

## 2024-01-14 DIAGNOSIS — D638 Anemia in other chronic diseases classified elsewhere: Secondary | ICD-10-CM

## 2024-01-14 DIAGNOSIS — D696 Thrombocytopenia, unspecified: Secondary | ICD-10-CM

## 2024-01-14 LAB — PHOSPHORUS: Phosphorus: 4.9 mg/dL — ABNORMAL HIGH (ref 2.5–4.6)

## 2024-01-14 LAB — CBC WITH DIFFERENTIAL/PLATELET
Abs Immature Granulocytes: 1.2 K/uL — ABNORMAL HIGH (ref 0.00–0.07)
Basophils Absolute: 0 K/uL (ref 0.0–0.1)
Basophils Relative: 0 %
Blasts: 70 %
Eosinophils Absolute: 0.6 K/uL — ABNORMAL HIGH (ref 0.0–0.5)
Eosinophils Relative: 1 %
HCT: 22.9 % — ABNORMAL LOW (ref 36.0–46.0)
Hemoglobin: 7.7 g/dL — ABNORMAL LOW (ref 12.0–15.0)
Lymphocytes Relative: 16 %
Lymphs Abs: 9.5 K/uL — ABNORMAL HIGH (ref 0.7–4.0)
MCH: 29.1 pg (ref 26.0–34.0)
MCHC: 33.6 g/dL (ref 30.0–36.0)
MCV: 86.4 fL (ref 80.0–100.0)
Metamyelocytes Relative: 2 %
Monocytes Absolute: 2.4 K/uL — ABNORMAL HIGH (ref 0.1–1.0)
Monocytes Relative: 4 %
Neutro Abs: 4.1 K/uL (ref 1.7–7.7)
Neutrophils Relative %: 7 %
Platelets: 27 K/uL — CL (ref 150–400)
RBC: 2.65 MIL/uL — ABNORMAL LOW (ref 3.87–5.11)
RDW: 15.9 % — ABNORMAL HIGH (ref 11.5–15.5)
Smear Review: NORMAL
WBC: 59.1 K/uL (ref 4.0–10.5)
nRBC: 0.1 % (ref 0.0–0.2)

## 2024-01-14 LAB — MRSA NEXT GEN BY PCR, NASAL: MRSA by PCR Next Gen: NOT DETECTED

## 2024-01-14 LAB — COMPREHENSIVE METABOLIC PANEL WITH GFR
ALT: 31 U/L (ref 0–44)
AST: 33 U/L (ref 15–41)
Albumin: 3 g/dL — ABNORMAL LOW (ref 3.5–5.0)
Alkaline Phosphatase: 174 U/L — ABNORMAL HIGH (ref 38–126)
Anion gap: 8 (ref 5–15)
BUN: 11 mg/dL (ref 8–23)
CO2: 31 mmol/L (ref 22–32)
Calcium: 8.3 mg/dL — ABNORMAL LOW (ref 8.9–10.3)
Chloride: 92 mmol/L — ABNORMAL LOW (ref 98–111)
Creatinine, Ser: 0.53 mg/dL (ref 0.44–1.00)
GFR, Estimated: >60 mL/min (ref >60)
Glucose, Bld: 91 mg/dL (ref 70–99)
Potassium: 3.5 mmol/L (ref 3.5–5.1)
Sodium: 131 mmol/L — ABNORMAL LOW (ref 135–145)
Total Bilirubin: 0.6 mg/dL (ref 0.0–1.2)
Total Protein: 5.5 g/dL — ABNORMAL LOW (ref 6.5–8.1)

## 2024-01-14 LAB — ECHOCARDIOGRAM COMPLETE
Height: 65 in
S' Lateral: 2.5 cm
Weight: 2342.17 [oz_av]

## 2024-01-14 LAB — PRO BRAIN NATRIURETIC PEPTIDE: Pro Brain Natriuretic Peptide: 198 pg/mL (ref <300.0)

## 2024-01-14 LAB — PROCALCITONIN: Procalcitonin: 0.5 ng/mL

## 2024-01-14 LAB — STREP PNEUMONIAE URINARY ANTIGEN: Strep Pneumo Urinary Antigen: NEGATIVE

## 2024-01-14 LAB — MAGNESIUM
Magnesium: 1.9 mg/dL (ref 1.7–2.4)
Magnesium: 1.8 mg/dL (ref 1.7–2.4)

## 2024-01-14 LAB — LACTIC ACID, PLASMA: Lactic Acid, Venous: 0.9 mmol/L (ref 0.5–1.9)

## 2024-01-14 MED ORDER — POTASSIUM CHLORIDE CRYS ER 20 MEQ PO TBCR
40.0000 meq | EXTENDED_RELEASE_TABLET | ORAL | Status: AC
  Administered 2024-01-14 (×2): 40 meq via ORAL
  Filled 2024-01-14 (×2): qty 2

## 2024-01-14 MED ORDER — IPRATROPIUM-ALBUTEROL 0.5-2.5 (3) MG/3ML IN SOLN
3.0000 mL | RESPIRATORY_TRACT | Status: AC
  Administered 2024-01-14: 3 mL via RESPIRATORY_TRACT
  Filled 2024-01-14: qty 3

## 2024-01-14 MED ORDER — GUAIFENESIN-DM 100-10 MG/5ML PO SYRP
5.0000 mL | ORAL_SOLUTION | ORAL | Status: DC | PRN
  Administered 2024-01-14 – 2024-01-17 (×4): 5 mL via ORAL
  Filled 2024-01-14 (×4): qty 10

## 2024-01-14 MED ORDER — ALPRAZOLAM 0.5 MG PO TABS
0.5000 mg | ORAL_TABLET | Freq: Two times a day (BID) | ORAL | Status: DC | PRN
  Administered 2024-01-15 – 2024-01-18 (×8): 0.5 mg via ORAL
  Filled 2024-01-14 (×8): qty 1

## 2024-01-14 MED ORDER — PROCHLORPERAZINE EDISYLATE 10 MG/2ML IJ SOLN
10.0000 mg | Freq: Four times a day (QID) | INTRAMUSCULAR | Status: DC | PRN
  Administered 2024-01-15 – 2024-01-17 (×3): 10 mg via INTRAVENOUS
  Filled 2024-01-14 (×5): qty 2

## 2024-01-14 MED ORDER — MAGNESIUM SULFATE 2 GM/50ML IV SOLN
2.0000 g | Freq: Once | INTRAVENOUS | Status: AC
  Administered 2024-01-14: 2 g via INTRAVENOUS
  Filled 2024-01-14: qty 50

## 2024-01-14 MED ORDER — PROMETHAZINE HCL 25 MG PO TABS
25.0000 mg | ORAL_TABLET | Freq: Four times a day (QID) | ORAL | Status: DC | PRN
  Administered 2024-01-14 – 2024-01-17 (×3): 25 mg via ORAL
  Filled 2024-01-14 (×4): qty 1

## 2024-01-14 MED ORDER — ONDANSETRON HCL 4 MG/2ML IJ SOLN
4.0000 mg | Freq: Four times a day (QID) | INTRAMUSCULAR | Status: DC | PRN
  Administered 2024-01-14 – 2024-01-17 (×6): 4 mg via INTRAVENOUS
  Filled 2024-01-14 (×6): qty 2

## 2024-01-14 MED ORDER — ENSURE PLUS HIGH PROTEIN PO LIQD: 237.0000 mL | Freq: Two times a day (BID) | ORAL | Status: DC

## 2024-01-14 MED ORDER — MORPHINE SULFATE (PF) 4 MG/ML IV SOLN
4.0000 mg | INTRAVENOUS | Status: AC
  Administered 2024-01-14: 4 mg via INTRAVENOUS
  Filled 2024-01-14: qty 1

## 2024-01-14 MED ORDER — HYDROXYZINE HCL 25 MG PO TABS
25.0000 mg | ORAL_TABLET | Freq: Three times a day (TID) | ORAL | Status: DC | PRN
  Administered 2024-01-14 – 2024-01-18 (×6): 25 mg via ORAL
  Filled 2024-01-14 (×7): qty 1

## 2024-01-14 MED ORDER — FUROSEMIDE 10 MG/ML IJ SOLN
40.0000 mg | Freq: Once | INTRAMUSCULAR | Status: AC
  Administered 2024-01-14: 40 mg via INTRAVENOUS
  Filled 2024-01-14: qty 4

## 2024-01-14 MED ORDER — FUROSEMIDE 10 MG/ML IJ SOLN
40.0000 mg | Freq: Once | INTRAMUSCULAR | Status: AC
Start: 2024-01-14 — End: 2024-01-14
  Administered 2024-01-14: 40 mg via INTRAVENOUS
  Filled 2024-01-14: qty 4

## 2024-01-14 MED ORDER — MORPHINE SULFATE (PF) 4 MG/ML IV SOLN
4.0000 mg | INTRAVENOUS | Status: AC | PRN
  Administered 2024-01-14 – 2024-01-15 (×3): 4 mg via INTRAVENOUS
  Filled 2024-01-14 (×3): qty 1

## 2024-01-14 NOTE — Progress Notes (Signed)
 Progress Note   Patient: [name] FMW:[id number] DOB: 1955/12/22 DOA: 01/13/2024     1 DOS: the patient was seen and examined on 01/14/2024   Brief hospital course: [name] is a 68 y.o. 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  Started with mild hemoptysis.  During her previous admission she was discharged on Levaquin  and Flagyl for 15 days for possible empyema.   In the ER, tachypneic with respiratory rate in the 30s.  Hypoxic requiring 4 L nasal cannula.  Hemoglobin 6.7 and platelet count less than 5.  2 units of platelets and 1 unit of RBCs ordered to be transfused in the ER.  CT angio chest was negative for pulmonary embolism however it showed mediastinal, hilar, axillary, celiac axis lymphadenopathy similar to prior study and likely related to history of leukemia.  Large bilateral pleural effusions with progression since prior study.  Compressive atelectasis or consolidation in the lung bases.  Developing patchy airspace disease in both lungs likely representing superimposed pneumonia or aspiration.  Markedly enlarged spleen likely related to leukemia.  Aortic atherosclerosis.   The patient was started on broad-spectrum IV antibiotics IV vancomycin  and cefepime  due to concern for pneumonia.  Assessment and Plan:  #Acute on chronic hypoxic respite failure (on 2L home O2) #Bilateral pleural effusion, right greater than left, POA Patient presented with shortness of breath, pleuritic chest pain, tachypnea with respiratory rate in the 30s, and hypoxia to 89% while on 2.5 lpm [place] requiring titration up to 4 lpm. (recently discharged on 2 lpm [place]).  Initial CXR showed stable large loculated right  pleural effusion with minimal left pleural effusion.  CTA chest showed no evidence of PE, stable lymphadenopathy, and noted large bilateral pleural effusions with progression since prior study associated with compressive atelectasis or consolidation in the lung bases, as well as development of patchy airspace disease in bilateral lungs concerning for superimposed pneumonia or aspiration. - Pulmonology following - indicating chronic right effusion less likely cause of dyspnea, still considered hypermobility to be malignant in nature despite negative cytology on previous thoracentesis.  Also thought to be less likely infectious given that she was was on Levaquin  and Flagyl since her discharge.  - Pulmonology recommending echo cardiogram and Lasix challenge - Echo pending - IV Lasix 40 mg once ordered  #Multifocal HCAP Patient was recently discharged on 10/3 with a 15-day prescription of Levaquin  and Flagyl due to possible empyema.  CTA chest on admission showed large bilateral pleural effusions compressive atelectasis or consolidation in the lung bases as well as development of patchy airspace disease in bilateral lungs concerning for superimposed pneumonia or aspiration. - Patient was started on empiric therapy with Vanc and cefepime  which has been de-escalated to cefepime  only after negative MRSA PCR - Strep pneumo urinary antigen negative - Legionella urinary antigen pending - Procalcitonin pending - Continue cefepime   #Hypokalemia - resolved - K 3.5 today  #Thrombocytopenia, severe - Platelets <5K on admission, now status post transfusion 2 units of platelets - Platelets improved to 27K today  #Anemia - Hgb 6.7 on admission, now status post transfusion 1 unit of PRBCs - Hgb improved to 7.7 today  #Refractory AML - Patient diagnosed with AML on 02/2023. - Was previously on hydroxyurea  which was stopped during  last admission - WBCs elevated to 78 on admission, improved to 59 today -  Oncology following  #Chronic hyponatremia - Sodium 131 today, stable, chronic  #Generalized weakness #Mechanical fall - Patient fell at home without any trauma to the head or loss of consciousness.  - Fall precautions - PT OT evaluation ordered  Subjective: Patient seen in bed this morning.  No acute events overnight per patient or RN staff.  Patient still reports mild shortness of breath and some hemoptysis.  Otherwise denies any blood in her stools.  States she does not generally eat well but takes Ensure supplements at home.  Physical Exam: Vitals:   01/14/24 0500 01/14/24 0530 01/14/24 0600 01/14/24 0630  BP: (!) 129/45 (!) 151/58 (!) 115/51 (!) 103/48  Pulse: 87 92 92 83  Resp: 16 (!) 22 18 17   Temp:  97.9 F (36.6 C)  98 F (36.7 C)  TempSrc:  Oral    SpO2: 96% 92% 91% 94%  Weight:      Height:       Physical Exam Constitutional:      General: She is not in acute distress.    Appearance: She is not ill-appearing.     Interventions: Nasal cannula in place.  HENT:     Mouth/Throat:     Mouth: Mucous membranes are moist.  Cardiovascular:     Rate and Rhythm: Normal rate and regular rhythm.     Heart sounds: Normal heart sounds. No murmur heard. Pulmonary:     Effort: Pulmonary effort is normal. No accessory muscle usage or respiratory distress.     Breath sounds: Examination of the right-middle field reveals decreased breath sounds. Examination of the right-lower field reveals decreased breath sounds and rales. Examination of the left-lower field reveals rales. Decreased breath sounds and rales present. No wheezing.  Abdominal:     General: There is no distension.     Palpations: Abdomen is soft.     Tenderness: There is no abdominal tenderness. There is no guarding.  Musculoskeletal:     Right lower leg: 1+ Pitting Edema present.     Left lower leg: 1+ Pitting Edema present.  Skin:    General: Skin is warm and dry.     Capillary Refill: Capillary refill takes  less than 2 seconds.  Neurological:     Mental Status: She is alert and oriented to person, place, and time. Mental status is at baseline.     Family Communication: Communicated with patient at bedside  Disposition: Status is: Inpatient Remains inpatient appropriate because: Continued respiratory failure, pending clinical improvement  Planned Discharge Destination: Home    Time spent: 60 minutes  Author: Zoeya Gramajo Al-Sultani, MD 01/14/2024 7:13 AM  For on call review [email or web address].

## 2024-01-14 NOTE — Plan of Care (Signed)

## 2024-01-14 NOTE — Progress Notes (Addendum)
 [name]   DOB:[birth year]   FM#:[id number]      ASSESSMENT & PLAN:  [name] is a 68 year old female patient with oncologic history significant for AML.  She has been following with [facility] and was treated with hydroxyurea . Admitted on 10/7 with complaints of cough and shortness of breath.  She was previously admitted and requested to be seen by Medical Oncology here at [facility].  Dr. Lanny following.   Acute myeloid leukemia (AML), refractory Leukocytosis  - AML diagnosed 02/2023.  - WBC elevated 77.9  - Previously on hydroxyurea  which was stopped during previous admission  - Imaging done 10/7 shows mediastinal, hilar, axillary, and celiac axis lymphadenopathy similar to prior study of 9/29.  - Followed with with [facility]/Dr. Chandra.  However difficult to go for appointments.  Patient has requested coming to MedOnc at [facility]/Dr. Lanny for blood products as outpatient. - Medical oncology/Dr. Lanny following closely.  Severe thrombocytopenia -- Platelets very low <5K. Platelet transfusions given. -- Recommend platelet transfusion for counts <20 K or <50 K with active bleeding -- Monitor platelet count  Anemia -- Hemoglobin low 6.7 on 10/7.  PRBC transfusion given.  -- Recommend PRBC for hemoglobin <7.0 -- Monitor CBC with differential closely   Pleural effusion Shortness of breath  Cough  - Large bilateral pleural effusions on imaging 10/7 with progression since prior study 9/29.   -- On IV antibiotics, continue as ordered -- On O2 via [place], continue respiratory treatments -- Pulm evaluation appreciated.  - Monitor respiratory status closely  Generalized weakness and deconditioning Status post fall -- Patient fell at home. States she did not hit her head however has large bruise on RUE. -- Falls precautions -- Continue supportive care        Code Status DNR-Limited  Subjective:  Patient seen awake and alert laying in bed.  She is ill-appearing and appears weak.  States that  she has a walker that has a seat and when she lowered herself to sit, it rolled from under her causing her to fall.  Denies hitting her head however has a large bruise on her RUE and smaller ones on LUE.  Admits to shortness of breath, currently on O2 via [place].  No other complaints offered.   Objective:   Intake/Output Summary (Last 24 hours) at 01/14/2024 1051 Last data filed at 01/14/2024 0905 Gross per 24 hour  Intake 705 ml  Output 500 ml  Net 205 ml     PHYSICAL EXAMINATION: ECOG PERFORMANCE STATUS: 3 - Symptomatic, >50% confined to bed  Vitals:   01/14/24 0900 01/14/24 0905  BP: (!) 147/57 (!) 144/57  Pulse: 95 93  Resp: (!) 21 (!) 22  Temp:  97.8 F (36.6 C)  SpO2: 91% 94%   Filed Weights   01/13/24 2247  Weight: 146 lb 6.2 oz (66.4 kg)    GENERAL: alert, +frail-appearing +chronically ill-appearing SKIN: +Pale skin color, +large bruise RUE +smaller ecchymotic areas LUE EYES: normal, conjunctiva are pink and non-injected, sclera clear OROPHARYNX: no exudate, no erythema and lips, buccal mucosa, and tongue normal  NECK: supple, thyroid normal size, non-tender, without nodularity LYMPH: no palpable lymphadenopathy in the cervical, axillary or inguinal LUNGS: clear to auscultation and percussion with normal breathing effort HEART: regular rate & rhythm and no murmurs and no lower extremity edema ABDOMEN: abdomen soft, non-tender and normal bowel sounds MUSCULOSKELETAL: no cyanosis of digits and no clubbing  PSYCH: alert & oriented x 3 with fluent speech NEURO: no  focal motor/sensory deficits   All questions were answered. The patient knows to call the clinic with any problems, questions or concerns.   The total time spent in the appointment was 40 minutes encounter with patient including review of chart and various tests results, discussions about plan of care and coordination of care plan  Olam JINNY Brunner, NP 01/14/2024 10:51 AM    Labs Reviewed:  Lab Results   Component Value Date   WBC 77.9 (HH) 01/13/2024   HGB 6.7 (LL) 01/13/2024   HCT 20.4 (L) 01/13/2024   MCV 87.2 01/13/2024   PLT <5 (LL) 01/13/2024   Recent Labs    12/21/23 0440 12/22/23 0236 01/05/24 0451 01/06/24 0311 01/07/24 0202 01/08/24 0319 01/13/24 1834  NA 126*   < > 126* 128* 129* 131* 132*  K 3.2*   < > 3.4* 3.5 3.4* 3.7 3.4*  CL 92*   < > 93* 94* 96* 97* 93*  CO2 22   < > 21* 23 23 24 29   GLUCOSE 116*   < > 124* 104* 103* 96 113*  BUN 14   < > 15 18 21 21 13   CREATININE 0.65   < > 0.80 0.90 0.69 0.65 0.69  CALCIUM 8.4*   < > 8.9 8.7* 8.5* 8.5* 8.4*  GFRNONAA >60   < > >60 >60 >60 >60 >60  PROT 5.8*   < > 6.5 5.8* 5.6* 5.6*  --   ALBUMIN 3.3*   < > 3.5  --  3.0* 2.9*  --   AST 27   < > 37  --  44* 38  --   ALT 41   < > 41  --  54* 51*  --   ALKPHOS 100   < > 157*  --  132* 159*  --   BILITOT 0.4   < > 0.5  --  0.5 0.4  --   BILIDIR 0.2  --  0.3*  --   --   --   --   IBILI 0.2*  --  0.2*  --   --   --   --    < > = values in this interval not displayed.    Studies Reviewed:  CT Angio Chest PE W and/or Wo Contrast Result Date: 01/13/2024 CLINICAL DATA:  Pulmonary embolus suspected with high probability. Chest pain and shortness of breath. EXAM: CT ANGIOGRAPHY CHEST WITH CONTRAST TECHNIQUE: Multidetector CT imaging of the chest was performed using the standard protocol during bolus administration of intravenous contrast. Multiplanar CT image reconstructions and MIPs were obtained to evaluate the vascular anatomy. RADIATION DOSE REDUCTION: This exam was performed according to the departmental dose-optimization program which includes automated exposure control, adjustment of the mA and/or kV according to patient size and/or use of iterative reconstruction technique. CONTRAST:  75mL OMNIPAQUE  IOHEXOL  350 MG/ML SOLN COMPARISON:  Chest radiograph 01/13/2024.  CT chest 01/05/2024 FINDINGS: Cardiovascular: Technically adequate study with good opacification of the central and  segmental pulmonary arteries. No focal filling defects. No evidence of significant pulmonary embolus. Normal heart size. No pericardial effusion. Normal caliber thoracic aorta. No aortic dissection. Great vessel origins are patent. Calcification of the aorta and coronary arteries. Mediastinum/Nodes: Esophagus is decompressed. Prominent mediastinal, hilar, and axillary lymphadenopathy. Largest aortopulmonary window lymph nodes measure 1.9 cm in short axis dimension. Pretracheal and right paratracheal lymph nodes measure up to 1.8 cm short axis dimension. Lymphadenopathy is similar to prior study and correspond to the patient's apparent history of leukemia.  Thyroid gland is unremarkable. Right central venous catheter with tip at the cavoatrial junction. Lungs/Pleura: Large bilateral pleural effusions with possible loculation on the right. There is mild increase of pleural effusion since prior study. Areas of consolidation or atelectasis in the lung bases with patchy airspace disease in both lungs. Airspace changes are increasing since the prior study suggesting likely superimposed multifocal pneumonia or aspiration. Diffuse peribronchial thickening on the right is increasing since prior study. Trachea and mainstem bronchi are mildly flatten due to extrinsic compression from lymphadenopathy but appear patent. No pneumothorax. Upper Abdomen: Severe enlarged spleen.  Celiac axis lymphadenopathy. Musculoskeletal: Degenerative changes in the spine. No focal bone lesions. Review of the MIP images confirms the above findings. IMPRESSION: 1. No evidence of significant pulmonary embolus. 2. Mediastinal, hilar, axillary, and celiac axis lymphadenopathy is similar to prior study and likely related to history of leukemia. 3. Large bilateral pleural effusions with progression since prior study. Compressive atelectasis or consolidation in the lung bases. Developing patchy airspace disease in both lungs likely representing  superimposed pneumonia or aspiration. 4. Markedly enlarged spleen likely related to leukemia. 5. Aortic atherosclerosis. Electronically Signed   By: Elsie Gravely M.D.   On: 01/13/2024 19:57   DG Chest 2 View Result Date: 01/13/2024 CLINICAL DATA:  Chest pain and shortness of breath. EXAM: CHEST - 2 VIEW COMPARISON:  January 06, 2024. FINDINGS: Stable cardiomediastinal silhouette. Right internal jugular Port-A-Cath is unchanged. Stable large loculated right pleural effusion is noted with associated right basilar atelectasis. Minimal left pleural effusion is noted. Bony thorax is unremarkable. IMPRESSION: Stable large loculated right pleural effusion is noted with associated right basilar atelectasis. Minimal left pleural effusion is noted. Electronically Signed   By: Lynwood Landy Raddle M.D.   On: 01/13/2024 18:24   DG CHEST PORT 1 VIEW Result Date: 01/06/2024 CLINICAL DATA:  Right pleural effusion. EXAM: PORTABLE CHEST 1 VIEW COMPARISON:  01/05/2024 FINDINGS: Similar appearance of large right pleural effusion with posterior loculated component better characterized on yesterday's CT. Small left effusion seen on yesterday's CT study not readily evident. The cardio pericardial silhouette is enlarged. Right Port-A-Cath again noted. Bones are diffusely demineralized. IMPRESSION: No substantial change. Similar appearance of large right pleural effusion with posterior loculated component better characterized on yesterday's CT. Electronically Signed   By: Camellia Candle M.D.   On: 01/06/2024 05:25   DG Chest Port 1 View Result Date: 01/05/2024 CLINICAL DATA:  Status post thoracentesis. EXAM: PORTABLE CHEST 1 VIEW COMPARISON:  Chest radiograph dated 01/05/2024. FINDINGS: Right-sided Port-A-Cath in similar position. No significant interval change in the size of the right pleural effusion and associated atelectasis/infiltrate. Underlying mass is not excluded. Small left pleural effusion as seen previously. No  identifiable pneumothorax. Stable cardiac silhouette no acute osseous pathology. IMPRESSION: No significant interval change since the prior radiograph. Electronically Signed   By: Vanetta Chou M.D.   On: 01/05/2024 15:49   CT CHEST W CONTRAST Result Date: 01/05/2024 CLINICAL DATA:  Pleural effusion, malignancy suspected Chest pain and shortness of breath since yesterday. EXAM: CT CHEST WITH CONTRAST TECHNIQUE: Multidetector CT imaging of the chest was performed during intravenous contrast administration. RADIATION DOSE REDUCTION: This exam was performed according to the departmental dose-optimization program which includes automated exposure control, adjustment of the mA and/or kV according to patient size and/or use of iterative reconstruction technique. CONTRAST:  75mL OMNIPAQUE  IOHEXOL  300 MG/ML  SOLN COMPARISON:  Chest CTA 01/02/2024, 09/24/2023 and 07/04/2023. FINDINGS: Cardiovascular: Accessed right IJ Port-A-Cath extends into the  upper right atrium. No acute vascular findings are demonstrated. Mild atherosclerosis of the aorta, great vessels and coronary arteries. Calcifications of the aortic valve. The heart size is normal. There is no pericardial effusion. Mediastinum/Nodes: Multiple enlarged mediastinal and hilar lymph nodes are again noted, similar to recent prior studies. Representative lymph nodes include a 2.0 cm right hilar node on image 28/3 and a 1.9 cm subcarinal node on image 28/3. No progressive adenopathy identified in the short interval from the previous chest CT. There are small axillary lymph nodes bilaterally. The thyroid gland, trachea and esophagus demonstrate no significant findings. Lungs/Pleura: Organizing and enlarging right pleural effusion is associated with thin pleural enhancement, suspicious for empyema. There is a small dependent left pleural effusion without suspicious pleural enhancement. Increasing compressive atelectasis dependently in the right upper, middle and lower  lobes. Unchanged patchy airspace disease posteromedially in the left lower lobe. Upper abdomen: No acute findings are seen within the visualized upper abdomen. The spleen is moderately enlarged. There are mildly enlarged lymph nodes within the visualized upper abdomen, grossly stable. Musculoskeletal/Chest wall: There is no chest wall mass or suspicious osseous finding. Mild multilevel spondylosis. IMPRESSION: 1. Organizing and enlarging right pleural effusion with thin pleural enhancement, suspicious for empyema. Correlate clinically. Recommend thoracentesis. 2. Increasing compressive atelectasis dependently in the right upper, middle and lower lobes. 3. Stable small left pleural effusion with patchy airspace disease posteromedially in the left lower lobe. 4. 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 Given the pleural process, some of these lymph nodes could be reactive. Associated moderate splenomegaly. 5.  Aortic Atherosclerosis (ICD10-I70.0). Electronically Signed   By: Elsie Perone M.D.   On: 01/05/2024 10:38   DG Chest 2 View Result Date: 01/05/2024 CLINICAL DATA:  68 year old female with chest pain.  Leukemia. EXAM: CHEST - 2 VIEW COMPARISON:  CTA chest 01/02/2024 and earlier. FINDINGS: PA and lateral views 0425 hours. Right chest Port-A-Cath is stable. Combined layering and loculated pleural effusions right greater than left with veiling opacity in the lungs. That on the right has progressed since 01/01/2024. No superimposed pneumothorax or pulmonary edema. No air bronchograms. Stable cardiac size and mediastinal contours - with mediastinal and hilar lymphadenopathy better demonstrated by CT. Visualized tracheal air column is within normal limits. No acute osseous abnormality identified. Stable cholecystectomy clips. Nonobstructed visible bowel gas pattern. IMPRESSION: Progressed appearance of Right lung combined layering and  loculated pleural effusion since 01/01/2024 CT. Stable smaller left pleural effusion. Known mediastinal lymphadenopathy. Electronically Signed   By: VEAR Hurst M.D.   On: 01/05/2024 05:01   CT Angio Chest PE W and/or Wo Contrast Result Date: 01/02/2024 CLINICAL DATA:  Chest pain radiating to the left neck. Shortness of breath, dizziness, lightheadedness, nausea/vomiting, and weakness. History of leukemia. EXAM: CT ANGIOGRAPHY CHEST CT ABDOMEN AND PELVIS WITH CONTRAST TECHNIQUE: Multidetector CT imaging of the chest was performed using the standard protocol during bolus administration of intravenous contrast. Multiplanar CT image reconstructions and MIPs were obtained to evaluate the vascular anatomy. Multidetector CT imaging of the abdomen and pelvis was performed using the standard protocol during bolus administration of intravenous contrast. RADIATION DOSE REDUCTION: This exam was performed according to the departmental dose-optimization program which includes automated exposure control, adjustment of the mA and/or kV according to patient size and/or use of iterative reconstruction technique. CONTRAST:  OMNIPAQUE  IOHEXOL  350 MG/ML SOLN COMPARISON:  CTA chest dated 09/24/2023 and CT abdomen/pelvis dated 05/17/2023 FINDINGS: CTA CHEST FINDINGS  Cardiovascular: Satisfactory opacification of the pulmonary arteries to the segmental level. No evidence of pulmonary embolism. Normal heart size. No pericardial effusion. Mild thoracic aortic atherosclerosis. Mediastinum/Nodes: Mediastinal and bilateral perihilar lymphadenopathy progressive, including: --dominant 2.2 cm subcarinal node (image 87), progressive --19 mm short axis right Perihilar Node --11 mm short axis left axillary node, progressive Right chest port terminates at the cavoatrial junction. Lungs/Pleura: Moderate right pleural effusion, grossly unchanged, with associated compressive atelectasis in the right lower lobe. Small left pleural effusion, new, with  associated dependent atelectasis. No pneumothorax. Musculoskeletal: Mild degenerative changes of the thoracic spine. Review of the MIP images confirms the above findings. CT ABDOMEN and PELVIS FINDINGS Hepatobiliary: No focal liver abnormality is seen. Status post cholecystectomy. Mild central intrahepatic and extrahepatic ductal prominence, postsurgical. Pancreas: Unremarkable. No pancreatic ductal dilatation or surrounding inflammatory changes. Spleen: Moderate splenomegaly, measuring 17.0 cm in maximal craniocaudal dimension, progressive. Adrenals/Urinary Tract: Adrenal glands are unremarkable. Kidneys are normal, without renal calculi, focal lesion, or hydronephrosis. Bladder is unremarkable. Stomach/Bowel: Stomach is within normal limits. Appendix appears normal (image 61). No evidence of bowel wall thickening, distention, or inflammatory changes. Vascular/Lymphatic: Atherosclerotic calcifications of the abdominal aorta and branch vessels, although patent. Progressive abdominopelvic lymphadenopathy, including: --15 mm short axis portacaval node (image 30) --13 mm short axis jejunal mesenteric node (image 45) --9 mm short axis left perirectal node (image 70) Reproductive: Calcified uterine fibroids.  No adnexal masses. Other: No abdominopelvic ascites Musculoskeletal: Mild degenerative changes of the lumbar spine. Review of the MIP images confirms the above findings. IMPRESSION: No evidence of pulmonary embolism. Progressive thoracic and abdominopelvic lymphadenopathy, as above, compatible with the patient's known leukemia. Moderate splenomegaly, progressive. Moderate right pleural effusion, grossly unchanged. Small left pleural effusion, new. Aortic Atherosclerosis (ICD10-I70.0). Electronically Signed   By: Pinkie Pebbles M.D.   On: 01/02/2024 01:45   CT ABDOMEN PELVIS W CONTRAST Result Date: 01/02/2024 CLINICAL DATA:  Chest pain radiating to the left neck. Shortness of breath, dizziness, lightheadedness,  nausea/vomiting, and weakness. History of leukemia. EXAM: CT ANGIOGRAPHY CHEST CT ABDOMEN AND PELVIS WITH CONTRAST TECHNIQUE: Multidetector CT imaging of the chest was performed using the standard protocol during bolus administration of intravenous contrast. Multiplanar CT image reconstructions and MIPs were obtained to evaluate the vascular anatomy. Multidetector CT imaging of the abdomen and pelvis was performed using the standard protocol during bolus administration of intravenous contrast. RADIATION DOSE REDUCTION: This exam was performed according to the departmental dose-optimization program which includes automated exposure control, adjustment of the mA and/or kV according to patient size and/or use of iterative reconstruction technique. CONTRAST:  OMNIPAQUE  IOHEXOL  350 MG/ML SOLN COMPARISON:  CTA chest dated 09/24/2023 and CT abdomen/pelvis dated 05/17/2023 FINDINGS: CTA CHEST FINDINGS Cardiovascular: Satisfactory opacification of the pulmonary arteries to the segmental level. No evidence of pulmonary embolism. Normal heart size. No pericardial effusion. Mild thoracic aortic atherosclerosis. Mediastinum/Nodes: Mediastinal and bilateral perihilar lymphadenopathy progressive, including: --dominant 2.2 cm subcarinal node (image 87), progressive --19 mm short axis right Perihilar Node --11 mm short axis left axillary node, progressive Right chest port terminates at the cavoatrial junction. Lungs/Pleura: Moderate right pleural effusion, grossly unchanged, with associated compressive atelectasis in the right lower lobe. Small left pleural effusion, new, with associated dependent atelectasis. No pneumothorax. Musculoskeletal: Mild degenerative changes of the thoracic spine. Review of the MIP images confirms the above findings. CT ABDOMEN and PELVIS FINDINGS Hepatobiliary: No focal liver abnormality is seen. Status post cholecystectomy. Mild central intrahepatic and extrahepatic ductal prominence,  postsurgical. Pancreas:  Unremarkable. No pancreatic ductal dilatation or surrounding inflammatory changes. Spleen: Moderate splenomegaly, measuring 17.0 cm in maximal craniocaudal dimension, progressive. Adrenals/Urinary Tract: Adrenal glands are unremarkable. Kidneys are normal, without renal calculi, focal lesion, or hydronephrosis. Bladder is unremarkable. Stomach/Bowel: Stomach is within normal limits. Appendix appears normal (image 61). No evidence of bowel wall thickening, distention, or inflammatory changes. Vascular/Lymphatic: Atherosclerotic calcifications of the abdominal aorta and branch vessels, although patent. Progressive abdominopelvic lymphadenopathy, including: --15 mm short axis portacaval node (image 30) --13 mm short axis jejunal mesenteric node (image 45) --9 mm short axis left perirectal node (image 70) Reproductive: Calcified uterine fibroids.  No adnexal masses. Other: No abdominopelvic ascites Musculoskeletal: Mild degenerative changes of the lumbar spine. Review of the MIP images confirms the above findings. IMPRESSION: No evidence of pulmonary embolism. Progressive thoracic and abdominopelvic lymphadenopathy, as above, compatible with the patient's known leukemia. Moderate splenomegaly, progressive. Moderate right pleural effusion, grossly unchanged. Small left pleural effusion, new. Aortic Atherosclerosis (ICD10-I70.0). Electronically Signed   By: Pinkie Pebbles M.D.   On: 01/02/2024 01:45   DG Chest 2 View Result Date: 01/01/2024 CLINICAL DATA:  Chest pain. Central chest pain starting today with radiation to the left neck. Shortness of breath, dizziness, lightheadedness, nausea, vomiting, and weakness. EXAM: CHEST - 2 VIEW COMPARISON:  12/21/2023 FINDINGS: Power port type central venous catheter with tip over the cavoatrial junction region. No pneumothorax. Heart size and pulmonary vascularity are normal. Small bilateral pleural effusions with basilar atelectasis are similar to  prior study. Mediastinal contours appear intact. Surgical clips in the right upper quadrant. Degenerative changes in the spine. IMPRESSION: Bilateral pleural effusions with basilar atelectasis, similar to prior study. Electronically Signed   By: Elsie Gravely M.D.   On: 01/01/2024 19:48   DG Chest 2 View Result Date: 12/21/2023 EXAM: 2 VIEW(S) XRAY OF THE CHEST 12/21/2023 04:52:11 AM COMPARISON: 09/26/2023 CLINICAL HISTORY: Chest pain. Complain of chest pain since Friday, shortness of breath present, progressively worse, feels like heaviness, nausea present as well. FINDINGS: LINES, TUBES AND DEVICES: Right IJ port catheter in place to distal SVC. LUNGS AND PLEURA: Chronic coarsened interstitial markings identified bilaterally. Bibasilar atelectasis or opacities. Small bilateral pleural effusions. HEART AND MEDIASTINUM: Heart size at upper limits of normal. BONES AND SOFT TISSUES: No acute osseous abnormality. IMPRESSION: 1. Bibasilar atelectasis and small bilateral pleural effusions. Electronically signed by: Waddell Calk MD 12/21/2023 05:38 AM EDT RP Workstation: [id number]   Addendum I have seen the patient, examined her. I agree with the assessment and and plan and have edited the notes.   68 year old female with refractory AML, off treatment and on supportive care alone now.  This is her fourth hospital admission in the past months.  She has a worsening thrombocytopenia and bleeding, bilateral pleural effusion which is likely related to AML.  She has received platelet transfusion.  I again discussed the overall very poor prognosis, her life expectancy is likely a few weeks to a few months, and I recommend home hospice care.  She understands there is no lab and blood transfusion if she is under hospice care, which she is still struggling with.  She agrees that the goal of care is palliative, and her comfort is her priority.  She wants to think about and discuss with her family again.  We will  follow-up tomorrow.  Will check with pulmonary to see if her pleural effusion can be drained before discharge.  Onita Mattock MD 01/14/2024

## 2024-01-14 NOTE — Progress Notes (Signed)
  Echocardiogram 2D Echocardiogram has been performed.  Tinnie FORBES Gosling RDCS 01/14/2024, 2:49 PM

## 2024-01-14 NOTE — Progress Notes (Signed)
 [facility]  Per chart review, patient is now following up at [facility] and has cancelled all upcoming appointments at [facility]. No additional pro-active outreach calls are planned by [facility] team at this time.   Patient understands how to contact OPN if needs arise in the future.  Please message our program through [facility]) if additional support is needed.

## 2024-01-14 NOTE — Consult Note (Addendum)
 NAME:  [name], MRN:  [id number], DOB:  [birth year], LOS: 1 ADMISSION DATE:  01/13/2024, CONSULTATION DATE:  10/8 REFERRING MD:  Cottie, CHIEF COMPLAINT:  Pleural effusion    History of Present Illness:   67 year old female w/ AML followed at [facility] and being treated w/ palliative Hydroxyurea .  Has sig h/o thrombocytopenia,anemia and chronic  right pleural effusion (First noted in June 2025, dx tap: exudate w/ cytology yielding: acute on chronic inflammatory cells w/ abundant Histocytes w/ atypical cells highlighted by CD34 immunohistochemical stain raising possibility of leukemia related effusion).   Was actually just discharged from [facility] 10/2 after presenting w/ cc SOB & right sided CP. During this admit the right effusion had since become loculated and there was concern for possible empyema. Had therapeutic and diagnostic thoracentesis on 9/29 which which again was exudate, culture neg, Path again showed atypical cells. We felt again likely malignant effusion but tap non-diagnostic. Discharged on levaquin  and flagyl to cover for possible pleural infection.    Presented once again to ER 10/7 w/ cc: cough, SOB and sputum w/ streaky hemoptysis.  She reported that her cough had persisted during her last hospitalization, present when she was discharged, then became acutely worse recumbent particularly when laying flat over the last 24 hours prior to admission.  Also noting the new onset of streaky hemoptysis during that time as well, in addition to progressive shortness of breath she has also had persistent lower extremity swelling first noted during her hospitalization towards the end of September..  In ER her RR 30s, hypoxic req'd 4 lpm, hgb 6.7, PLT <5K. CT chest neg for PE, loculated right effusion, mediastinal and hilar adenopathy. Mod left effusion, and increased bilateral patchy airspace disease.   Cultures sent. Started on abx. Transfused both blood and platelets.  PCCM asked to see RE:  recurrent effusion   Pertinent  Medical History  Acute Myeloid Leukemia (AML), followed at [facility] treated w/ palliative Hydroxyurea , Recurrent Pleural effusions, June 2025 tap: exudative Anemia & thrombocytopenia requiring multiple transfusions in past, Aortic atherosclerosis, chronic Hyponatremia, GERD,  DNR   Significant Hospital Events: Including procedures, antibiotic start and stop dates in addition to other pertinent events   10/7 admitted with acute on chronic hypoxic respiratory failure, persistent right loculated effusion, and left pleural effusion.  Worsening bilateral airspace disease started on empiric antibiotics  Interim History / Subjective:  Not in acute distress  Objective    Blood pressure (!) 132/58, pulse 89, temperature 97.9 F (36.6 C), temperature source Oral, resp. rate 15, height 5' 5 (1.651 m), weight 66.4 kg, SpO2 90%.        Intake/Output Summary (Last 24 hours) at 01/14/2024 0742 Last data filed at 01/14/2024 0225 Gross per 24 hour  Intake 405 ml  Output 300 ml  Net 105 ml   Filed Weights   01/13/24 2247  Weight: 66.4 kg    Examination: General: Frail 68 year old female patient lying in bed no acute distress currently HENT: Normocephalic atraumatic no jugular venous distention is appreciated Lungs: Diffuse posterior rales without accessory use on 3 L/min, diminished in the right base.  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  Acute on chronic hypoxic respiratory  failure  Chronic right pleural effusion (now loculated) Mod left effusion  Streaky hemoptysis Bibasilar pulmonary infiltrates  Severe thrombocytopenia (chronic, transfusion dependent) Anemia (chronic , transfusion dependent) Chronic hyponatremia  Severe deconditioning  DNR status   Pulmonary problem list Acute on chronic hypoxic respiratory failure secondary to progressive bilateral pulmonary infiltrates , Complicated further by recurrent loculated right pleural effusion and small to moderate left pleural effusion  Discussion Not convinced that her pleural effusion is the driving factor in her dyspnea.  The right effusion has been chronic since June, it is now loculated.  Although cytology has been negative there remains a high probability that this is malignant in nature.  She now has worsening infiltrates in both bases, in addition to orthopnea, cough that is worse in the supine position, and worsening lower extremity edema, with her being already on levofloxacin  the likelihood that this represents infection seems low.  Would be more inclined to think this represents either cardiogenic or noncardiogenic pulmonary edema  Plan Will give 40 Lasix now Obtain echocardiogram Continue to wean supplemental oxygen Okay to continue current antibiotics for now Would not recommend thoracentesis at this point.  Given her thrombocytopenia and the likelihood that the effusion is related to her malignancy, or at least trapped lung we we will get much benefit  I think she would be a poor candidate for PleurX given risk of bleeding  Streaky hemoptysis Likely exacerbated by pulmonary edema and thrombocytopenia, she has received platelets Plan Continue to monitor I do not think she would be a good candidate for bronchoscopy given her hypoxia and frail state Labs   CBC: Recent Labs  Lab 01/08/24 0319 01/13/24 1834  WBC 69.0* 77.9*  HGB 7.9* 6.7*  HCT 24.1* 20.4*  MCV 86.1 87.2  PLT 12* <5*     Basic Metabolic Panel: Recent Labs  Lab 01/08/24 0319 01/13/24 1834  NA 131* 132*  K 3.7 3.4*  CL 97* 93*  CO2 24 29  GLUCOSE 96 113*  BUN 21 13  CREATININE 0.65 0.69  CALCIUM 8.5* 8.4*  MG 2.1  --    GFR: Estimated Creatinine Clearance: 58.9 mL/min (by C-G formula based on SCr of 0.69 mg/dL). Recent Labs  Lab 01/08/24 0319 01/13/24 1834 01/13/24 2146 01/13/24 2342  WBC 69.0* 77.9*  --   --   LATICACIDVEN  --   --  1.6 0.9    Liver Function Tests: Recent Labs  Lab 01/08/24 0319  AST 38  ALT 51*  ALKPHOS 159*  BILITOT 0.4  PROT 5.6*  ALBUMIN 2.9*   No results for input(s): LIPASE, AMYLASE in the last 168 hours. No results for input(s): AMMONIA in the last 168 hours.  ABG    Component Value Date/Time   TCO2 27 04/11/2008 1222     Coagulation Profile: No results for input(s): INR, PROTIME in the last 168 hours.  Cardiac Enzymes: No results for input(s): CKTOTAL, CKMB, CKMBINDEX, TROPONINI in the last 168 hours.  HbA1C: No results found for: HGBA1C  CBG: No results for input(s): GLUCAP in the last 168 hours.  Review of Systems:   Review of Systems  Constitutional:  Positive for fever and malaise/fatigue.  HENT: Negative.    Eyes: Negative.   Respiratory:  Positive for cough, hemoptysis, sputum production and shortness of breath.   Cardiovascular:  Positive for chest pain and  leg swelling.  Gastrointestinal: Negative.   Genitourinary: Negative.   Musculoskeletal: Negative.  Negative for myalgias.  Neurological: Negative.   Endo/Heme/Allergies: Negative.      Past Medical History:  She,  has a past medical history of AML (acute myeloid leukemia) (HCC), Anemia, Aortic atherosclerosis, Chronic hyponatremia, GERD (gastroesophageal reflux disease), and Thrombocytopenia.   Surgical History:   Past Surgical History:  Procedure Laterality Date   CARDIAC SURGERY     CHOLECYSTECTOMY     HIP ARTHROSCOPY     IR IMAGING  GUIDED PORT INSERTION  05/19/2023   surgery on left foot Left      Social History:   reports that she has been smoking cigarettes. She has a 1 pack-year smoking history. She has never used smokeless tobacco. She reports that she does not currently use alcohol. She reports that she does not currently use drugs.   Family History:  Her family history includes Cancer in her maternal grandfather, maternal grandmother, and mother; Heart failure in her father and sister.   Allergies Allergies  Allergen Reactions   Penicillin G Anaphylaxis and Other (See Comments)    Has tolerated Rocephin  and Cefepime  on multiple occasions   Prednisone Anaphylaxis, Shortness Of Breath, Swelling and Other (See Comments)    Pt states it turned her into a monster, lips started swelling and couldn't breathe   Shellfish Allergy Anaphylaxis and Swelling   Codeine Nausea And Vomiting   Heparin  Other (See Comments)    Passed out    Oxycodone  Nausea And Vomiting     Home Medications  Prior to Admission medications   Medication Sig Start Date End Date Taking? Authorizing Provider  acetaminophen  (TYLENOL ) 325 MG tablet Take 2 tablets (650 mg total) by mouth every 6 (six) hours as needed for mild pain (pain score 1-3) or fever (or Fever >/= 101). 01/08/24  Yes Gonfa, Taye T, MD  ALPRAZolam  (XANAX ) 0.5 MG tablet Take 0.5 mg by mouth 2 (two) times daily as needed for anxiety.   Yes [provider]  ascorbic acid  (VITAMIN C ) 500 MG tablet Take 500 mg by mouth daily.   Yes [provider]  b complex vitamins capsule Take 1 capsule by mouth daily.   Yes [provider]  cholecalciferol  (VITAMIN D3) 10 MCG (400 UNIT) TABS tablet Take 400 Units by mouth daily.   Yes [provider]  furosemide (LASIX) 40 MG tablet Take 1 tablet (40 mg total) by mouth daily as needed for up to 18 days for fluid or edema. 01/08/24 02/07/24 Yes Gonfa, Taye T, MD  hydrOXYzine  (ATARAX ) 25 MG tablet Take 1 tablet  (25 mg total) by mouth 3 (three) times daily as needed for anxiety. 01/09/24 02/08/24 Yes Gonfa, Taye T, MD  ipratropium-albuterol  (DUONEB) 0.5-2.5 (3) MG/3ML SOLN Take 3 mLs by nebulization every 6 (six) hours as needed (Shortness of breath, cough and/or wheeze). 01/09/24 02/08/24 Yes Gonfa, Taye T, MD  levofloxacin  (LEVAQUIN ) 750 MG tablet Take 1 tablet (750 mg total) by mouth daily for 15 days. 01/09/24 01/24/24 Yes Gonfa, Taye T, MD  metroNIDAZOLE (FLAGYL) 500 MG tablet Take 1 tablet (500 mg total) by mouth every 12 (twelve) hours for 15 days. 01/08/24 01/23/24 Yes Gonfa, Taye T, MD  nicotine  (NICODERM CQ  - DOSED IN MG/24 HOURS) 21 mg/24hr patch Place 1 patch (21 mg total) onto the skin daily. 07/08/23  Yes Swayze, Ava, DO  ondansetron  (ZOFRAN ) 8 MG tablet Take 8 mg by mouth every 8 (eight) hours as needed  for nausea or vomiting.   Yes [provider]  pantoprazole  (PROTONIX ) 20 MG tablet TAKE ONE TABLET (20 MG TOTAL) BY MOUTH DAILY. Patient taking differently: Take 20 mg by mouth daily before breakfast. 07/23/23  Yes Melanee Annah BROCKS, MD  prochlorperazine  (COMPAZINE ) 10 MG tablet Take 10 mg by mouth every 6 (six) hours as needed for nausea or vomiting. 12/16/23  Yes [provider]  promethazine  (PHENERGAN ) 25 MG tablet Take 25 mg by mouth every 6 (six) hours as needed for nausea. 12/15/23  Yes [provider]  traMADol  (ULTRAM ) 50 MG tablet Take 50 mg by mouth every 6 (six) hours as needed for moderate pain (pain score 4-6). 09/23/23 09/22/24 Yes [provider]  senna-docusate (SENOKOT-S) 8.6-50 MG tablet Take 1-2 tablets by mouth 2 (two) times daily between meals as needed for mild constipation or moderate constipation. Patient not taking: Reported on 01/13/2024 01/08/24   Kathrin Mignon DASEN, MD  sodium chloride  1 g tablet Take 1 tablet (1 g total) by mouth 2 (two) times daily with a meal. Patient not taking: Reported on 01/13/2024 01/08/24   Kathrin Mignon DASEN, MD     Critical care  time: NA

## 2024-01-14 NOTE — Evaluation (Signed)
 Physical Therapy Evaluation Patient Details Name: [name] MRN: [id number] DOB: 1955/04/26 Today's Date: 01/14/2024  History of Present Illness  [name] is a 68 y.o. female with medical history significant for AML on on palliative hydroxyurea , chronic thrombocytopenia, recurrent loculated right-sided pleural effusion, recently admitted on 01/05/2024 for recurrent anemia and thrombocytopenia, and discharged on 01/08/24, who presented to the ER 01/13/24 with complaints of shortness of breath and a productive cough.CT angio chest was negative for pulmonary embolism however it showed mediastinal, hilar, axillary, celiac axis lymphadenopathy similar to prior study and likely related to history of leukemia.  Large bilateral pleural effusions with progression since prior study  Clinical Impression   The patient reports that she slid from rollator that did not lock and scraped her right Upper arm, noted bruising and dressing in place.  Patient reports that she has very supportive family that are available to assist. Patient declines need for HHPT. Patient asking for a 2 wheeled RW since her rollator is not safe per patient.     Patient maintained on 3 L [place] , SPo2 92% HR 102.  Pt admitted with above diagnosis.  Pt currently with functional limitations due to the deficits listed below (see PT Problem List). Pt will benefit from acute skilled PT to increase their independence and safety with mobility to allow discharge.         If plan is discharge home, recommend the following: A little help with walking and/or transfers;A little help with bathing/dressing/bathroom;Assistance with cooking/housework;Assist for transportation;Help with stairs or ramp for entrance   Can travel by private vehicle        Equipment Recommendations None recommended by PT  Recommendations for Other Services       Functional Status Assessment Patient has had a recent decline in their functional status and  demonstrates the ability to make significant improvements in function in a reasonable and predictable amount of time.     Precautions / Restrictions Precautions Precautions: Fall Precaution/Restrictions Comments: monitor SPO2 Restrictions Weight Bearing Restrictions Per Provider Order: No      Mobility  Bed Mobility Overal bed mobility: Needs Assistance Bed Mobility: Supine to Sit     Supine to sit: Supervision, Used rails, HOB elevated          Transfers Overall transfer level: Needs assistance Equipment used: 1 person hand held assist Transfers: Sit to/from Stand, Bed to chair/wheelchair/BSC Sit to Stand: Min assist   Step pivot transfers: Min assist       General transfer comment: HHA  to step to recliner    Ambulation/Gait                  Stairs            Wheelchair Mobility     Tilt Bed    Modified Rankin (Stroke Patients Only)       Balance Overall balance assessment: Needs assistance Sitting-balance support: Feet supported, No upper extremity supported Sitting balance-Leahy Scale: Fair     Standing balance support: During functional activity, Single extremity supported Standing balance-Leahy Scale: Fair                               Pertinent Vitals/Pain Pain Assessment Pain Assessment: Faces Faces Pain Scale: Hurts even more Pain Location: SOB, and Right upper arm. reports slid from rollator that was not locked Pain Descriptors / Indicators: Grimacing, Discomfort Pain Intervention(s): Monitored during session, Premedicated  before session    Home Living Family/patient expects to be discharged to:: Private residence Living Arrangements: Other relatives;Non-relatives/Friends Available Help at Discharge: Family;Available 24 hours/day Type of Home: House Home Access: Stairs to enter Entrance Stairs-Rails: (P) Left;Right Entrance Stairs-Number of Steps: 2   Home Layout: One level Home Equipment: None       Prior Function Prior Level of Function : Needs assist             Mobility Comments: family has been assisting  since DC   1 wk ago       Extremity/Trunk Assessment   Upper Extremity Assessment Upper Extremity Assessment: Generalized weakness    Lower Extremity Assessment Lower Extremity Assessment: Generalized weakness    Cervical / Trunk Assessment Cervical / Trunk Assessment: Normal  Communication   Communication Communication: No apparent difficulties    Cognition Arousal: Alert Behavior During Therapy: WFL for tasks assessed/performed   PT - Cognitive impairments: No apparent impairments                         Following commands: Intact       Cueing       General Comments      Exercises     Assessment/Plan    PT Assessment Patient needs continued PT services  PT Problem List Decreased strength;Cardiopulmonary status limiting activity;Decreased activity tolerance;Decreased mobility;Decreased knowledge of precautions       PT Treatment Interventions DME instruction;Therapeutic exercise;Gait training;Functional mobility training;Therapeutic activities;Patient/family education    PT Goals (Current goals can be found in the Care Plan section)  Acute Rehab PT Goals Patient Stated Goal: go home PT Goal Formulation: With patient Time For Goal Achievement: 01/28/24 Potential to Achieve Goals: Fair    Frequency Min 3X/week     Co-evaluation               AM-PAC PT 6 Clicks Mobility  Outcome Measure Help needed turning from your back to your side while in a flat bed without using bedrails?: None Help needed moving from lying on your back to sitting on the side of a flat bed without using bedrails?: None Help needed moving to and from a bed to a chair (including a wheelchair)?: A Little Help needed standing up from a chair using your arms (e.g., wheelchair or bedside chair)?: A Little Help needed to walk in hospital room?:  Total Help needed climbing 3-5 steps with a railing? : Total 6 Click Score: 16    End of Session Equipment Utilized During Treatment: Oxygen Activity Tolerance: Patient tolerated treatment well Patient left: in chair;with call bell/phone within reach;with chair alarm set Nurse Communication: Mobility status PT Visit Diagnosis: Muscle weakness (generalized) (M62.81);Difficulty in walking, not elsewhere classified (R26.2)    Time: 1135-1150 PT Time Calculation (min) (ACUTE ONLY): 15 min   Charges:   PT Evaluation $PT Eval Low Complexity: 1 Low   PT General Charges $$ ACUTE PT VISIT: 1 Visit         Darice Potters PT Acute Rehabilitation Services Office [phone]   Potters Darice Norris 01/14/2024, 3:05 PM

## 2024-01-14 NOTE — Hospital Course (Signed)
 [name] is a 68 y.o. 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  Started with mild hemoptysis.  During her previous admission she was discharged on Levaquin  and Flagyl for 15 days for possible empyema.   In the ER, tachypneic with respiratory rate in the 30s.  Hypoxic requiring 4 L nasal cannula.  Hemoglobin 6.7 and platelet count less than 5.  2 units of platelets and 1 unit of RBCs ordered to be transfused in the ER.  CT angio chest was negative for pulmonary embolism however it showed mediastinal, hilar, axillary, celiac axis lymphadenopathy similar to prior study and likely related to history of leukemia.  Large bilateral pleural effusions with progression since prior study.  Compressive atelectasis or consolidation in the lung bases.  Developing patchy airspace disease in both lungs likely representing superimposed pneumonia or aspiration.  Markedly enlarged spleen likely related to leukemia.  Aortic atherosclerosis.   The patient was started on broad-spectrum IV antibiotics IV vancomycin  and cefepime  due to concern for pneumonia.

## 2024-01-15 ENCOUNTER — Inpatient Hospital Stay: Payer: Self-pay | Admitting: Hematology

## 2024-01-15 ENCOUNTER — Inpatient Hospital Stay: Payer: Self-pay

## 2024-01-15 DIAGNOSIS — J90 Pleural effusion, not elsewhere classified: Secondary | ICD-10-CM | POA: Diagnosis not present

## 2024-01-15 LAB — CBC
HCT: 24.4 % — ABNORMAL LOW (ref 36.0–46.0)
Hemoglobin: 8 g/dL — ABNORMAL LOW (ref 12.0–15.0)
MCH: 28.5 pg (ref 26.0–34.0)
MCHC: 32.8 g/dL (ref 30.0–36.0)
MCV: 86.8 fL (ref 80.0–100.0)
Platelets: 16 K/uL — CL (ref 150–400)
RBC: 2.81 MIL/uL — ABNORMAL LOW (ref 3.87–5.11)
RDW: 15.9 % — ABNORMAL HIGH (ref 11.5–15.5)
WBC: 60.3 K/uL (ref 4.0–10.5)
nRBC: 0 % (ref 0.0–0.2)

## 2024-01-15 LAB — TYPE AND SCREEN
ABO/RH(D): A POS
Antibody Screen: NEGATIVE
Unit division: 0
Unit division: 0

## 2024-01-15 LAB — BPAM RBC
Blood Product Expiration Date: 202510292359
Blood Product Expiration Date: 202511012359
ISSUE DATE / TIME: 202510080257
Unit Type and Rh: 5100
Unit Type and Rh: 6200

## 2024-01-15 LAB — PREPARE PLATELET PHERESIS
Unit division: 0
Unit division: 0

## 2024-01-15 LAB — BPAM PLATELET PHERESIS
Blood Product Expiration Date: 202510102359
Blood Product Expiration Date: 202510102359
ISSUE DATE / TIME: 202510080011
ISSUE DATE / TIME: 202510080628
Unit Type and Rh: 7300
Unit Type and Rh: 7300

## 2024-01-15 LAB — BASIC METABOLIC PANEL WITH GFR
Anion gap: 6 (ref 5–15)
BUN: 15 mg/dL (ref 8–23)
CO2: 33 mmol/L — ABNORMAL HIGH (ref 22–32)
Calcium: 8.5 mg/dL — ABNORMAL LOW (ref 8.9–10.3)
Chloride: 93 mmol/L — ABNORMAL LOW (ref 98–111)
Creatinine, Ser: 0.54 mg/dL (ref 0.44–1.00)
GFR, Estimated: >60 mL/min (ref >60)
Glucose, Bld: 90 mg/dL (ref 70–99)
Potassium: 4.2 mmol/L (ref 3.5–5.1)
Sodium: 132 mmol/L — ABNORMAL LOW (ref 135–145)

## 2024-01-15 LAB — LEGIONELLA PNEUMOPHILA SEROGP 1 UR AG: L. pneumophila Serogp 1 Ur Ag: NEGATIVE

## 2024-01-15 LAB — PROCALCITONIN: Procalcitonin: 0.64 ng/mL

## 2024-01-15 MED ORDER — FUROSEMIDE 10 MG/ML IJ SOLN
40.0000 mg | Freq: Four times a day (QID) | INTRAMUSCULAR | Status: AC
  Administered 2024-01-15 (×2): 40 mg via INTRAVENOUS
  Filled 2024-01-15 (×2): qty 4

## 2024-01-15 MED ORDER — MAGNESIUM SULFATE 2 GM/50ML IV SOLN
2.0000 g | Freq: Once | INTRAVENOUS | Status: AC
  Administered 2024-01-15: 2 g via INTRAVENOUS
  Filled 2024-01-15: qty 50

## 2024-01-15 MED ORDER — POTASSIUM CHLORIDE CRYS ER 20 MEQ PO TBCR
40.0000 meq | EXTENDED_RELEASE_TABLET | ORAL | Status: AC
  Administered 2024-01-15 (×2): 40 meq via ORAL
  Filled 2024-01-15 (×2): qty 2

## 2024-01-15 MED ORDER — LOPERAMIDE HCL 2 MG PO CAPS
2.0000 mg | ORAL_CAPSULE | ORAL | Status: DC | PRN
  Administered 2024-01-15 (×3): 2 mg via ORAL
  Filled 2024-01-15 (×3): qty 1

## 2024-01-15 MED ORDER — SODIUM CHLORIDE 0.9% IV SOLUTION: Freq: Once | INTRAVENOUS | Status: AC

## 2024-01-15 NOTE — Progress Notes (Signed)
   01/15/24 1554  TOC Brief Assessment  Insurance and Status Reviewed  Patient has primary care physician Yes  Home environment has been reviewed Single family home  Prior level of function: Indpendent with ADL's  Prior/Current Home Services Current home services (Home Palliative services)  Social Drivers of Health Review SDOH reviewed no interventions necessary  Readmission risk has been reviewed Yes  Transition of care needs transition of care needs identified, TOC will continue to follow   Pt is from home. Palliative care services established with [facility] during previous hospitalization. Pt receives home O2 supply through [employer]. PT/OT have no rec for any follow-up. ICM will follow for any new recommendations or DC needs.

## 2024-01-15 NOTE — Progress Notes (Signed)
 NAME:  [name], MRN:  [id number], DOB:  22-Sep-1955, LOS: 2 ADMISSION DATE:  01/13/2024, CONSULTATION DATE:  10/8 REFERRING MD:  Cottie, CHIEF COMPLAINT:  Pleural effusion    History of Present Illness:   68 year old female w/ AML followed at [facility] and being treated w/ palliative Hydroxyurea .  Has sig h/o thrombocytopenia,anemia and chronic  right pleural effusion (First noted in June 2025, dx tap: exudate w/ cytology yielding: acute on chronic inflammatory cells w/ abundant Histocytes w/ atypical cells highlighted by CD34 immunohistochemical stain raising possibility of leukemia related effusion).   Was actually just discharged from [facility] 10/2 after presenting w/ cc SOB & right sided CP. During this admit the right effusion had since become loculated and there was concern for possible empyema. Had therapeutic and diagnostic thoracentesis on 9/29 which which again was exudate, culture neg, Path again showed atypical cells. We felt again likely malignant effusion but tap non-diagnostic. Discharged on levaquin  and flagyl to cover for possible pleural infection.    Presented once again to ER 10/7 w/ cc: cough, SOB and sputum w/ streaky hemoptysis.  She reported that her cough had persisted during her last hospitalization, present when she was discharged, then became acutely worse recumbent particularly when laying flat over the last 24 hours prior to admission.  Also noting the new onset of streaky hemoptysis during that time as well, in addition to progressive shortness of breath she has also had persistent lower extremity swelling first noted during her hospitalization towards the end of September..  In ER her RR 30s, hypoxic req'd 4 lpm, hgb 6.7, PLT <5K. CT chest neg for PE, loculated right effusion, mediastinal and hilar adenopathy. Mod left effusion, and increased bilateral patchy airspace disease.   Cultures sent. Started on abx. Transfused both blood and platelets.  PCCM asked to see RE:  recurrent effusion   Pertinent  Medical History  Acute Myeloid Leukemia (AML), followed at [facility] treated w/ palliative Hydroxyurea , Recurrent Pleural effusions, June 2025 tap: exudative Anemia & thrombocytopenia requiring multiple transfusions in past, Aortic atherosclerosis, chronic Hyponatremia, GERD,  DNR   Significant Hospital Events: Including procedures, antibiotic start and stop dates in addition to other pertinent events   10/7 admitted with acute on chronic hypoxic respiratory failure, persistent right loculated effusion, and left pleural effusion.  Worsening bilateral airspace disease started on empiric antibiotics 10/8 echocardiogram obtained normal LV function diastolic function indeterminate IVC with 50% variability with respiratory efforts, no significant valvular disorder.  Was treated with IV Lasix x 2 10/9 much improved weaning oxygen cough resolved  Interim History / Subjective:  Feels much better, tells me the Lasix works much better than the thoracentesis  Objective    Blood pressure (!) 154/73, pulse (!) 102, temperature 97.8 F (36.6 C), temperature source Oral, resp. rate (!) 22, height 5' 5 (1.651 m), weight 66.4 kg, SpO2 93%.        Intake/Output Summary (Last 24 hours) at 01/15/2024 0958 Last data filed at 01/15/2024 0900 Gross per 24 hour  Intake 663.13 ml  Output 2600 ml  Net -1936.87 ml   Filed Weights   01/13/24 2247  Weight: 66.4 kg    Examination: General Sitting up in bed no acute distress HEENT normocephalic atraumatic no jugular venous distention is appreciated Pulmonary fine crackles in the bases, little more diminished on the right side, no accessory use, currently at 4 L but saturations are 100% Cardiac regular rate and rhythm Abdomen soft nontender Extremities warm dry with  brisk cap refill edema is improving can almost see her ankles now Neuro awake and oriented Derm petechial rash over the back Resolved problem list  Streaky  hemoptysis  Assessment and Plan   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  Acute on chronic hypoxic respiratory failure  Chronic right pleural effusion (now loculated) Mod left effusion  Streaky hemoptysis Bibasilar pulmonary infiltrates  Severe thrombocytopenia (chronic, transfusion dependent) Anemia (chronic , transfusion dependent) Chronic hyponatremia  Severe deconditioning  DNR status   Pulmonary problem list Acute on chronic hypoxic respiratory failure secondary to progressive bilateral pulmonary infiltrates , Complicated further by recurrent loculated right pleural effusion and small to moderate left pleural effusion  Discussion Feel pretty certain her pleural effusion is not the driving factor in her dyspnea.  The right effusion has been chronic since June, it is now loculated.  Although cytology has been negative there remains a high probability that this is malignant in nature.  She presented w/ increased bilateral infiltrates that  have responded nicely to IV diuretics so more inclined to think this represents either cardiogenic or noncardiogenic pulmonary edema; her EF is good, by ECHO, IVC had >50% variability so cardiogenic edema seems unlikely.  so would favor non-cardiogenic likely from third spacing from cancer related hypoalbuminemia    Plan Mobilize Cont pulse ox Continue to wean supplemental oxygen Okay to continue current antibiotics for now, cap at 5 days total  Lasix again today IV x2 Re-assess chems in am. Should be able to transition to oral (not sure at this point if will need daily lasix or a PRN strategy)  Would not recommend thoracentesis at this point.  Given her thrombocytopenia and the likelihood that the effusion is related to her malignancy, or at least trapped lung we we will get much benefit  I think she would be a poor candidate for PleurX given risk of bleeding   We will sign off call PRN  Critical care time:  NA     My time 26 min

## 2024-01-15 NOTE — Plan of Care (Signed)

## 2024-01-15 NOTE — Progress Notes (Addendum)
 [name]   DOB:05/09/1955   FM#:[id number]   [id number]  Hematology follow-up  Subjective: Patient states that her dyspnea has improved some, legs are still swollen.  No bleeding or fever.  Her sister was at bedside when I saw her.   Objective:  Vitals:   01/15/24 1900 01/15/24 2000  BP: (!) 125/53 (!) 135/51  Pulse: 87 87  Resp: 18 20  Temp:  (!) 97.5 F (36.4 C)  SpO2: 93% 93%    Body mass index is 24.36 kg/m.  Intake/Output Summary (Last 24 hours) at 01/15/2024 2116 Last data filed at 01/15/2024 1231 Gross per 24 hour  Intake 563.17 ml  Output 575 ml  Net -11.83 ml     Sclerae unicteric  No peripheral adenopathy  Abdomen benign  MSK no focal spinal tenderness, (+)peripheral edema  Neuro nonfocal    CBG (last 3)  No results for input(s): GLUCAP in the last 72 hours.   Labs:  Lab Results  Component Value Date   WBC 60.3 (HH) 01/15/2024   HGB 8.0 (L) 01/15/2024   HCT 24.4 (L) 01/15/2024   MCV 86.8 01/15/2024   PLT 16 (LL) 01/15/2024   NEUTROABS 4.1 01/14/2024   Urine Studies No results for input(s): UHGB, CRYS in the last 72 hours.  Invalid input(s): UACOL, UAPR, USPG, UPH, UTP, UGL, UKET, UBIL, UNIT, UROB, ULEU, UEPI, UWBC, [place], [place], [place], [place], [place]  Basic Metabolic Panel: Recent Labs  Lab 01/13/24 1834 01/14/24 1044 01/14/24 1627 01/15/24 0409  NA 132* 131*  --  132*  K 3.4* 3.5  --  4.2  CL 93* 92*  --  93*  CO2 29 31  --  33*  GLUCOSE 113* 91  --  90  BUN 13 11  --  15  CREATININE 0.69 0.53  --  0.54  CALCIUM 8.4* 8.3*  --  8.5*  MG  --  1.9 1.8  --   PHOS  --  4.9*  --   --    GFR Estimated Creatinine Clearance: 58.9 mL/min (by C-G formula based on SCr of 0.54 mg/dL). Liver Function Tests: Recent Labs  Lab 01/14/24 1044  AST 33  ALT 31  ALKPHOS 174*  BILITOT 0.6  PROT 5.5*  ALBUMIN 3.0*   No results for input(s): LIPASE, AMYLASE in the last 168 hours. No results for  input(s): AMMONIA in the last 168 hours. Coagulation profile No results for input(s): INR, PROTIME in the last 168 hours.  CBC: Recent Labs  Lab 01/13/24 1834 01/14/24 1044 01/15/24 0409  WBC 77.9* 59.1* 60.3*  NEUTROABS  --  4.1  --   HGB 6.7* 7.7* 8.0*  HCT 20.4* 22.9* 24.4*  MCV 87.2 86.4 86.8  PLT <5* 27* 16*   Cardiac Enzymes: No results for input(s): CKTOTAL, CKMB, CKMBINDEX, TROPONINI in the last 168 hours. BNP: Invalid input(s): POCBNP CBG: No results for input(s): GLUCAP in the last 168 hours. D-Dimer Recent Labs    01/13/24 1834  DDIMER 2.42*   Hgb A1c No results for input(s): HGBA1C in the last 72 hours. Lipid Profile No results for input(s): CHOL, HDL, LDLCALC, TRIG, CHOLHDL, LDLDIRECT in the last 72 hours. Thyroid function studies No results for input(s): TSH, T4TOTAL, T3FREE, THYROIDAB in the last 72 hours.  Invalid input(s): FREET3 Anemia work up No results for input(s): VITAMINB12, FOLATE, FERRITIN, TIBC, IRON, RETICCTPCT in the last 72 hours. Microbiology Recent Results (from the past 240 hours)  MRSA Next Gen by PCR, Nasal  Status: None   Collection Time: 01/05/24  9:34 PM   Specimen: Nasal Mucosa; Nasal Swab  Result Value Ref Range Status   MRSA by PCR Next Gen NOT DETECTED NOT DETECTED Final    Comment: (NOTE) The GeneXpert MRSA Assay (FDA approved for NASAL specimens only), is one component of a comprehensive MRSA colonization surveillance program. It is not intended to diagnose MRSA infection nor to guide or monitor treatment for MRSA infections. Test performance is not FDA approved in patients less than 27 years old. Performed at [facility], [address], [place], [place] [address]   Resp panel by RT-PCR (RSV, Flu A&B, Covid) Anterior Nasal Swab     Status: None   Collection Time: 01/13/24  6:52 PM   Specimen: Anterior Nasal Swab  Result Value Ref Range  Status   SARS Coronavirus 2 by RT PCR NEGATIVE NEGATIVE Final    Comment: (NOTE) SARS-CoV-2 target nucleic acids are NOT DETECTED.  The SARS-CoV-2 RNA is generally detectable in upper respiratory specimens during the acute phase of infection. The lowest concentration of SARS-CoV-2 viral copies this assay can detect is 138 copies/mL. A negative result does not preclude SARS-Cov-2 infection and should not be used as the sole basis for treatment or other patient management decisions. A negative result may occur with  improper specimen collection/handling, submission of specimen other than nasopharyngeal swab, presence of viral mutation(s) within the areas targeted by this assay, and inadequate number of viral copies(<138 copies/mL). A negative result must be combined with clinical observations, patient history, and epidemiological information. The expected result is Negative.  Fact Sheet for Patients:  BloggerCourse.com  Fact Sheet for Healthcare Providers:  SeriousBroker.it  This test is no t yet approved or cleared by the [place]  FDA and  has been authorized for detection and/or diagnosis of SARS-CoV-2 by FDA under an Emergency Use Authorization (EUA). This EUA will remain  in effect (meaning this test can be used) for the duration of the COVID-19 declaration under Section 564(b)(1) of the Act, 21 U.S.C.section 360bbb-3(b)(1), unless the authorization is terminated  or revoked sooner.       Influenza A by PCR NEGATIVE NEGATIVE Final   Influenza B by PCR NEGATIVE NEGATIVE Final    Comment: (NOTE) The Xpert Xpress SARS-CoV-2/FLU/RSV plus assay is intended as an aid in the diagnosis of influenza from Nasopharyngeal swab specimens and should not be used as a sole basis for treatment. Nasal washings and aspirates are unacceptable for Xpert Xpress SARS-CoV-2/FLU/RSV testing.  Fact Sheet for  Patients: BloggerCourse.com  Fact Sheet for Healthcare Providers: SeriousBroker.it  This test is not yet approved or cleared by the [place]  FDA and has been authorized for detection and/or diagnosis of SARS-CoV-2 by FDA under an Emergency Use Authorization (EUA). This EUA will remain in effect (meaning this test can be used) for the duration of the COVID-19 declaration under Section 564(b)(1) of the Act, 21 U.S.C. section 360bbb-3(b)(1), unless the authorization is terminated or revoked.     Resp Syncytial Virus by PCR NEGATIVE NEGATIVE Final    Comment: (NOTE) Fact Sheet for Patients: BloggerCourse.com  Fact Sheet for Healthcare Providers: SeriousBroker.it  This test is not yet approved or cleared by the [place]  FDA and has been authorized for detection and/or diagnosis of SARS-CoV-2 by FDA under an Emergency Use Authorization (EUA). This EUA will remain in effect (meaning this test can be used) for the duration of the COVID-19 declaration under Section 564(b)(1) of the Act, 21  U.S.C. section 360bbb-3(b)(1), unless the authorization is terminated or revoked.  Performed at [facility], [address], [place], [place] [address]   MRSA Next Gen by PCR, Nasal     Status: None   Collection Time: 01/13/24 10:57 PM   Specimen: Nasal Mucosa; Nasal Swab  Result Value Ref Range Status   MRSA by PCR Next Gen NOT DETECTED NOT DETECTED Final    Comment: (NOTE) The GeneXpert MRSA Assay (FDA approved for NASAL specimens only), is one component of a comprehensive MRSA colonization surveillance program. It is not intended to diagnose MRSA infection nor to guide or monitor treatment for MRSA infections. Test performance is not FDA approved in patients less than 6 years old. Performed at [facility], [address] [place], [place]  [address]       Studies:  ECHOCARDIOGRAM COMPLETE Result Date: 01/14/2024    ECHOCARDIOGRAM REPORT   Patient Name:   AHRIA SLAPPEY Wehmeyer Date of Exam: 01/14/2024 Medical Rec #:  [id number]      Height:       65.0 in Accession #:    [id number]     Weight:       146.4 lb Date of Birth:  18-Mar-1956      BSA:          1.732 m Patient Age:    68 years       BP:           138/47 mmHg Patient Gender: F              HR:           98 bpm. Exam Location:  Inpatient Procedure: 2D Echo, Cardiac Doppler and Color Doppler (Both Spectral and Color            Flow Doppler were utilized during procedure). Indications:    Dyspnea R06.00  History:        Patient has no prior history of Echocardiogram examinations.  Sonographer:    Tinnie Gosling RDCS Referring Phys: [id number] PETER E BABCOCK IMPRESSIONS  1. Left ventricular ejection fraction, by estimation, is 60 to 65%. The left ventricle has normal function. The left ventricle has no regional wall motion abnormalities. Indeterminate diastolic filling due to E-A fusion.  2. Right ventricular systolic function is normal. The right ventricular size is normal. There is normal pulmonary artery systolic pressure.  3. The mitral valve is normal in structure. No evidence of mitral valve regurgitation. No evidence of mitral stenosis.  4. The aortic valve is tricuspid. Aortic valve regurgitation is not visualized. Aortic valve sclerosis/calcification is present, without any evidence of aortic stenosis.  5. The inferior vena cava is normal in size with greater than 50% respiratory variability, suggesting right atrial pressure of 3 mmHg. Comparison(s): No prior Echocardiogram. FINDINGS  Left Ventricle: Left ventricular ejection fraction, by estimation, is 60 to 65%. The left ventricle has normal function. The left ventricle has no regional wall motion abnormalities. The left ventricular internal cavity size was normal in size. There is  no left ventricular hypertrophy. Indeterminate diastolic filling  due to E-A fusion. Right Ventricle: The right ventricular size is normal. No increase in right ventricular wall thickness. Right ventricular systolic function is normal. There is normal pulmonary artery systolic pressure. The tricuspid regurgitant velocity is 2.40 m/s, and  with an assumed right atrial pressure of 3 mmHg, the estimated right ventricular systolic pressure is 26.0 mmHg. Left [facility]: Left atrial size was normal in size.  Right [facility]: Right atrial size was normal in size. Pericardium: There is no evidence of pericardial effusion. Mitral Valve: The mitral valve is normal in structure. No evidence of mitral valve regurgitation. No evidence of mitral valve stenosis. Tricuspid Valve: The tricuspid valve is normal in structure. Tricuspid valve regurgitation is trivial. No evidence of tricuspid stenosis. Aortic Valve: The aortic valve is tricuspid. Aortic valve regurgitation is not visualized. Aortic valve sclerosis/calcification is present, without any evidence of aortic stenosis. Pulmonic Valve: The pulmonic valve was normal in structure. Pulmonic valve regurgitation is not visualized. No evidence of pulmonic stenosis. Aorta: The aortic root is normal in size and structure. Venous: The inferior vena cava is normal in size with greater than 50% respiratory variability, suggesting right atrial pressure of 3 mmHg. IAS/Shunts: No atrial level shunt detected by color flow Doppler.  LEFT VENTRICLE PLAX 2D LVIDd:         4.50 cm   Diastology LVIDs:         2.50 cm   LV e' lateral: 6.09 cm/s LV PW:         1.00 cm LV IVS:        1.00 cm LVOT diam:     1.90 cm LV SV:         57 LV SV Index:   33 LVOT Area:     2.84 cm  RIGHT VENTRICLE             IVC RV S prime:     14.00 cm/s  IVC diam: 1.20 cm TAPSE (M-mode): 2.2 cm LEFT ATRIUM             Index        RIGHT ATRIUM           Index LA diam:        3.40 cm 1.96 cm/m   RA Area:     10.70 cm LA Vol (A2C):   43.4 ml 25.05 ml/m  RA Volume:   22.60 ml  13.05 ml/m LA  Vol (A4C):   23.7 ml 13.68 ml/m LA Biplane Vol: 34.6 ml 19.97 ml/m  AORTIC VALVE LVOT Vmax:   122.00 cm/s LVOT Vmean:  73.500 cm/s LVOT VTI:    0.202 m  AORTA Ao Root diam: 3.30 cm Ao Asc diam:  3.00 cm TRICUSPID VALVE TR Peak grad:   23.0 mmHg TR Vmax:        240.00 cm/s  SHUNTS Systemic VTI:  0.20 m Systemic Diam: 1.90 cm Emeline Calender Electronically signed by Emeline Calender Signature Date/Time: 01/14/2024/5:26:43 PM    Final     Assessment: 68 y.o. female   AML, on supportive care only Bilateral oral effusion, likely related to AML Acute on chronic hypoxia secondary to #2 Multifocal pneumonia versus leukemic infiltrates in her lungs Severe thrombocytopenia and anemia secondary to leukemia Deconditioning    Plan:  - She is not a candidate for further AML treatment - She has required frequent blood and platelet transfusion, every few days - I again encouraged her to consider home hospice care.  She understands the very poor prognosis, and wants to be comfortable at home, however she is not still not ready to keep up with blood transfusion. - Her pulmonary infiltrates are probably related to her leukemia, I am not sure if antibiotics is indicated.  She has diarrhea which is probably from antibiotics. -Pulmonary team does not think she is a candidate for thoracentesis. Continue oxygen and supportive care. - She has agreed  with DNR. - Hopefully she can be discharged home in 1 to 2 days. - I will try to schedule lab and blood transfusion in my office 2-3 times next week.   Onita Mattock, MD 01/15/2024

## 2024-01-15 NOTE — Evaluation (Signed)
 Occupational Therapy Evaluation Patient Details Name: [name] MRN: [id number] DOB: [birth year] Today's Date: 01/15/2024   History of Present Illness   Patient is a 68 year old female who presented to the ED with c/o shortness of breath and productive cough; also endorsed falling at home.  Dx with bilateral pleural effusion and PNA.  PMHx includes CAD, thrombocytopenia, cholecystectomy, acute myeloid leukemia (current), left foot surgery     Clinical Impressions PTA patient was independent living at home alone.  Due to admitting diagnoses, patient is experiencing decreased activity tolerance which impacts her ability to complete tasks at prior level.  Patient was observed completing toileting and functional mobility tasks at baseline and has strong support at home from her granddaughter and other family members and friends, and is anticipated to return home with no further need of OT follow-up.  Patient can benefit from continued acute OT services for training in energy conservation strategies to support safe return home.  Patient will remain on acute OT caseload for that reason.  No post-discharge OT services anticipated.     If plan is discharge home, recommend the following:   A little help with walking and/or transfers     Functional Status Assessment   Patient has not had a recent decline in their functional status     Equipment Recommendations   None recommended by OT      Precautions/Restrictions   Precautions Precautions: Fall Precaution/Restrictions Comments: Monitor SpO2 Restrictions Weight Bearing Restrictions Per Provider Order: No     Mobility Bed Mobility   Patient Response: Cooperative  Transfers Overall transfer level: Modified independent Equipment used: None Transfers: Sit to/from Stand Sit to Stand: Modified independent (Device/Increase time) (Extra time)   General transfer comment: Patient demonstrated appropriate use of armrests to /  from chair and BSC      Balance Overall balance assessment: Independent Sitting-balance support: Feet supported, No upper extremity supported Sitting balance-Leahy Scale: Good    Standing balance support: No upper extremity supported, During functional activity Standing balance-Leahy Scale: Good Standing balance comment: Standing from recliner and walking ~4' to [facility]     ADL either performed or assessed with clinical judgement   ADL Overall ADL's : Modified independent Eating/Feeding: Independent;Sitting   Grooming: Modified independent;Sitting   Upper Body Bathing: Modified independent;Sitting   Lower Body Bathing: Modified independent;Sitting/lateral leans   Upper Body Dressing : Modified independent;Sitting   Lower Body Dressing: Modified independent;Sit to/from stand   Toilet Transfer: Independent;Ambulation;BSC/3in1   Toileting- Clothing Manipulation and Hygiene: Independent;Sitting/lateral lean;Sit to/from stand Toileting - Architect Details (indicate cue type and reason): Patient completed peri care w/ lateral lean and donned brief w/ sit > stand.    Functional mobility during ADLs: Modified independent (extra time for navigating lines)       Vision Baseline Vision/History: 0 No visual deficits Ability to See in Adequate Light: 0 Adequate Patient Visual Report: No change from baseline Vision Assessment?: No apparent visual deficits            Pertinent Vitals/Pain Pain Assessment Pain Assessment: No/denies pain Pain Intervention(s): Monitored during session     Extremity/Trunk Assessment Upper Extremity Assessment Upper Extremity Assessment: Overall WFL for tasks assessed;Generalized weakness (Strength grossly 4-/5)   Lower Extremity Assessment Lower Extremity Assessment: Defer to PT evaluation   Cervical / Trunk Assessment Cervical / Trunk Assessment: Normal   Communication Communication Communication: No apparent difficulties (Patient  exhibited approrpaite affect and active sense of humor)   Cognition Arousal: Alert Behavior  During Therapy: WFL for tasks assessed/performed Cognition: No apparent impairments   OT - Cognition Comments: Oriented x4   Following commands: Intact                  Home Living Family/patient expects to be discharged to:: Private residence Living Arrangements: Alone Available Help at Discharge: Family;Available 24 hours/day Type of Home: House Home Access: Stairs to enter [employer] of Steps: 3-4 Entrance Stairs-Rails: Can reach both Home Layout: Two level;Able to live on main level with bedroom/bathroom    Bathroom Shower/Tub: Producer, television/film/video: Handicapped height Bathroom Accessibility: Yes How Accessible: Accessible via wheelchair Home Equipment: Agricultural consultant (2 wheels);Rollator (4 wheels);Cane - single point;BSC/3in1;Shower seat;Hand held shower head      Prior Functioning/Environment Prior Level of Function : Independent/Modified Independent   ADLs Comments: Patient endorses independence with ADL and IADL activities including driving.    OT Problem List: Decreased strength;Decreased activity tolerance   OT Treatment/Interventions: Self-care/ADL training;Energy conservation;Therapeutic activities;Patient/family education      OT Goals(Current goals can be found in the care plan section)   Acute Rehab OT Goals Patient Stated Goal: Get her strength back and get back home OT Goal Formulation: With patient/family Time For Goal Achievement: 01/29/24 Potential to Achieve Goals: Good ADL Goals Pt/caregiver will Perform Home Exercise Program: With theraband;Independently;With written HEP provided Additional ADL Goal #1: Patient will verbalize 3 energy conservation strategies to support safe return home at [facility].   OT Frequency:  Min 1X/week       AM-PAC OT 6 Clicks Daily Activity     Outcome Measure Help from another person eating  meals?: None Help from another person taking care of personal grooming?: None Help from another person toileting, which includes using toliet, bedpan, or urinal?: None Help from another person bathing (including washing, rinsing, drying)?: None Help from another person to put on and taking off regular upper body clothing?: None Help from another person to put on and taking off regular lower body clothing?: None 6 Click Score: 24   End of Session Equipment Utilized During Treatment: Oxygen  Activity Tolerance: Patient tolerated treatment well Patient left: in bed;with call bell/phone within reach  OT Visit Diagnosis: Muscle weakness (generalized) (M62.81)                Time: 8874-8850 OT Time Calculation (min): 24 min Charges:  OT General Charges $OT Visit: 1 Visit OT Evaluation $OT Eval Moderate Complexity: 1 Mod OT Treatments $Self Care/Home Management : 8-22 mins  Duaine Radin B. Misha Vanoverbeke, MS, OTR/L  01/15/2024, 12:18 PM

## 2024-01-15 NOTE — Progress Notes (Signed)
 Progress Note   Patient: [name] FMW:[id number] DOB: 1955-11-01 DOA: 01/13/2024     2 DOS: the patient was seen and examined on 01/15/2024   Brief hospital course: [name] is a 68 y.o. 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  Started with mild hemoptysis.  During her previous admission she was discharged on Levaquin  and Flagyl for 15 days for possible empyema.   In the ER, tachypneic with respiratory rate in the 30s.  Hypoxic requiring 4 L nasal cannula.  Hemoglobin 6.7 and platelet count less than 5.  2 units of platelets and 1 unit of RBCs ordered to be transfused in the ER.  CT angio chest was negative for pulmonary embolism however it showed mediastinal, hilar, axillary, celiac axis lymphadenopathy similar to prior study and likely related to history of leukemia.  Large bilateral pleural effusions with progression since prior study.  Compressive atelectasis or consolidation in the lung bases.  Developing patchy airspace disease in both lungs likely representing superimposed pneumonia or aspiration.  Markedly enlarged spleen likely related to leukemia.  Aortic atherosclerosis.   The patient was started on broad-spectrum IV antibiotics IV vancomycin  and cefepime  due to concern for pneumonia.  Assessment and Plan:  #Acute on chronic hypoxic respite failure (on 2L home O2) #Bilateral pleural effusion, right greater than left, POA Patient presented with shortness of breath, pleuritic chest pain, tachypnea with respiratory rate in the 30s, and hypoxia to 89% while on 2.5 lpm [place] requiring titration up to 4 lpm. (recently discharged on 2 lpm [place]).  Initial CXR showed stable large loculated right  pleural effusion with minimal left pleural effusion.  CTA chest showed no evidence of PE, stable lymphadenopathy, and noted large bilateral pleural effusions with progression since prior study associated with compressive atelectasis or consolidation in the lung bases, as well as development of patchy airspace disease in bilateral lungs concerning for superimposed pneumonia or aspiration. - Pulmonology following - indicating chronic right effusion less likely cause of dyspnea, still considered hypermobility to be malignant in nature despite negative cytology on previous thoracentesis.  Also thought to be less likely infectious given that she was was on Levaquin  and Flagyl since her discharge.  - Pulmonology recommending echo cardiogram and Lasix challenge - Echo (10/8) showed LVEF 60-65%, no regional wall motion abnormalities, normal RV systolic function, normal PASP, no valvular abnormalities - Trialed IV Lasix 40 mg on 10/7, positive response - IV Lasix 40 mg x 2 doses today, transition to PO scheduled versus PRN tomorrow - Wean O2 as tolerated - Continuous SpO2 monitoring  #Multifocal HCAP Patient was recently discharged on 10/3 with a 15-day prescription of Levaquin  and Flagyl due to possible empyema.  CTA chest on admission showed large bilateral pleural effusions compressive atelectasis or consolidation in the lung bases as well as development of patchy airspace disease in bilateral lungs concerning for superimposed pneumonia or aspiration. - Patient was started on empiric therapy with Vanc and cefepime  which has been de-escalated to cefepime  only after negative MRSA PCR - Strep pneumo urinary antigen negative - Legionella urinary antigen pending - Lower Resp Cx pending collection - Procalcitonin elevated to 0.64 - Continue IV Cefepime  for 5 days total  #Hemoptysis, mild - Patient presented with mild streaked hemoptysis.  Improving.  Most likely secondary to persistent coughing with  concomitant pulmonary edema in the setting of thrombocytopenia - Hgb has been stable - Receiving a unit of platelets today  #Hypokalemia - resolved  #Thrombocytopenia, severe - Platelets <5K on admission, now status post transfusion 2 units of platelets - Platelets down to 16K today - Receiving another unit of platelets today  #Anemia - Hgb 6.7 on admission, now status post transfusion 1 unit of PRBCs - Hgb stable at 8.0 today  #Refractory AML - Patient diagnosed with AML on 02/2023. - Was previously on hydroxyurea  which was stopped during last admission - WBCs elevated to 78 on admission, stable at 60 today - Oncology following  #Chronic hyponatremia - Sodium 132 today, stable, chronic  #Generalized weakness #Mechanical fall - Patient fell at home without any trauma to the head or loss of consciousness.  - Fall precautions - PT OT evaluation ordered  Subjective: Patient seen in bed this morning.  No acute events overnight per patient or RN staff.  Patient reports improvement in her symptoms although she remains on the same amount of supplemental oxygen.  Saw improvement in her hemoptysis.  Additionally reports improvement in the swelling in her legs.  She denies any chest pain, nausea, vomiting, abdominal pain.   Physical Exam: Vitals:   01/15/24 0100 01/15/24 0200 01/15/24 0300 01/15/24 0400  BP: (!) 127/47 (!) 130/53 (!) 131/55 (!) 129/52  Pulse: 84 86 88 85  Resp: (!) 25 20 (!) 22 18  Temp:    97.9 F (36.6 C)  TempSrc:    Oral  SpO2: 97% 93% 97% 92%  Weight:      Height:       Physical Exam Constitutional:      General: She is not in acute distress.    Appearance: She is not ill-appearing.     Interventions: Nasal cannula in place.  HENT:     Mouth/Throat:     Mouth: Mucous membranes are moist.  Cardiovascular:     Rate and Rhythm: Normal rate and regular rhythm.     Heart sounds: Normal heart sounds. No murmur heard. Pulmonary:     Effort: Pulmonary  effort is normal. No accessory muscle usage or respiratory distress.     Breath sounds: Examination of the right-lower field reveals rales. Examination of the left-lower field reveals rales. Rales (mild) present. No wheezing.  Abdominal:     General: There is no distension.     Palpations: Abdomen is soft.     Tenderness: There is no abdominal tenderness. There is no guarding.  Musculoskeletal:     Right lower leg: Edema (Trace) present.     Left lower leg: Edema (Trace) present.  Skin:    General: Skin is warm and dry.     Capillary Refill: Capillary refill takes less than 2 seconds.  Neurological:     Mental Status: She is alert and oriented to person, place, and time. Mental status is at baseline.     Family Communication: Communicated with patient at bedside  Disposition: Status is: Inpatient Remains inpatient appropriate because: Continued respiratory failure, pending clinical improvement Planned Discharge Destination: Home  DVT Prophylaxis: SCDs Start: 01/13/24 2116   Time spent: 40 minutes  Author: Duffy Larch, MD 01/15/2024 5:14 AM  For on call review [email or web address].

## 2024-01-16 DIAGNOSIS — J90 Pleural effusion, not elsewhere classified: Secondary | ICD-10-CM | POA: Diagnosis not present

## 2024-01-16 DIAGNOSIS — L8990 Pressure ulcer of unspecified site, unspecified stage: Secondary | ICD-10-CM | POA: Diagnosis present

## 2024-01-16 LAB — BASIC METABOLIC PANEL WITH GFR
Anion gap: 7 (ref 5–15)
BUN: 15 mg/dL (ref 8–23)
CO2: 31 mmol/L (ref 22–32)
Calcium: 8.7 mg/dL — ABNORMAL LOW (ref 8.9–10.3)
Chloride: 94 mmol/L — ABNORMAL LOW (ref 98–111)
Creatinine, Ser: 0.61 mg/dL (ref 0.44–1.00)
GFR, Estimated: >60 mL/min (ref >60)
Glucose, Bld: 100 mg/dL — ABNORMAL HIGH (ref 70–99)
Potassium: 4.3 mmol/L (ref 3.5–5.1)
Sodium: 132 mmol/L — ABNORMAL LOW (ref 135–145)

## 2024-01-16 LAB — CBC
HCT: 24.3 % — ABNORMAL LOW (ref 36.0–46.0)
Hemoglobin: 8.2 g/dL — ABNORMAL LOW (ref 12.0–15.0)
MCH: 29.7 pg (ref 26.0–34.0)
MCHC: 33.7 g/dL (ref 30.0–36.0)
MCV: 88 fL (ref 80.0–100.0)
Platelets: 24 K/uL — CL (ref 150–400)
RBC: 2.76 MIL/uL — ABNORMAL LOW (ref 3.87–5.11)
RDW: 15.9 % — ABNORMAL HIGH (ref 11.5–15.5)
WBC: 66.8 K/uL (ref 4.0–10.5)
nRBC: 0 % (ref 0.0–0.2)

## 2024-01-16 LAB — PREPARE PLATELET PHERESIS: Unit division: 0

## 2024-01-16 LAB — BPAM PLATELET PHERESIS
Blood Product Expiration Date: 202510122359
ISSUE DATE / TIME: 202510090548
Unit Type and Rh: 6200

## 2024-01-16 LAB — PROCALCITONIN: Procalcitonin: 0.54 ng/mL

## 2024-01-16 MED ORDER — NICOTINE 21 MG/24HR TD PT24
21.0000 mg | MEDICATED_PATCH | Freq: Every day | TRANSDERMAL | Status: DC
  Administered 2024-01-16 – 2024-01-18 (×3): 21 mg via TRANSDERMAL
  Filled 2024-01-16 (×3): qty 1

## 2024-01-16 MED ORDER — TRAMADOL HCL 50 MG PO TABS
50.0000 mg | ORAL_TABLET | Freq: Four times a day (QID) | ORAL | Status: DC | PRN
  Administered 2024-01-16 – 2024-01-17 (×2): 50 mg via ORAL
  Filled 2024-01-16 (×3): qty 1

## 2024-01-16 MED ORDER — METRONIDAZOLE 500 MG PO TABS
500.0000 mg | ORAL_TABLET | Freq: Two times a day (BID) | ORAL | Status: DC
  Administered 2024-01-16 – 2024-01-18 (×4): 500 mg via ORAL
  Filled 2024-01-16 (×4): qty 1

## 2024-01-16 MED ORDER — PANTOPRAZOLE SODIUM 20 MG PO TBEC
20.0000 mg | DELAYED_RELEASE_TABLET | Freq: Every day | ORAL | Status: DC
  Administered 2024-01-16 – 2024-01-18 (×3): 20 mg via ORAL
  Filled 2024-01-16 (×3): qty 1

## 2024-01-16 MED ORDER — MORPHINE SULFATE (PF) 2 MG/ML IV SOLN
2.0000 mg | Freq: Four times a day (QID) | INTRAVENOUS | Status: AC | PRN
  Administered 2024-01-16 – 2024-01-17 (×2): 2 mg via INTRAVENOUS
  Filled 2024-01-16 (×2): qty 1

## 2024-01-16 MED ORDER — IPRATROPIUM-ALBUTEROL 0.5-2.5 (3) MG/3ML IN SOLN: 3.0000 mL | Freq: Four times a day (QID) | RESPIRATORY_TRACT | Status: DC | PRN

## 2024-01-16 MED ORDER — LEVOFLOXACIN 500 MG PO TABS
750.0000 mg | ORAL_TABLET | ORAL | Status: DC
Start: 2024-01-16 — End: 2024-01-18
  Administered 2024-01-16 – 2024-01-17 (×2): 750 mg via ORAL
  Filled 2024-01-16 (×2): qty 2

## 2024-01-16 NOTE — Progress Notes (Signed)
 Physical Therapy Treatment Patient Details Name: [name] MRN: [id number] DOB: [birth year] Today's Date: 01/16/2024   History of Present Illness Patient is a 68 year old female who presented to the ED with c/o shortness of breath and productive cough; also endorsed falling at home.  Dx with bilateral pleural effusion and PNA.  PMHx includes CAD, thrombocytopenia, cholecystectomy, acute myeloid leukemia (current), left foot surgery    PT Comments   Pt admitted with above diagnosis.  Pt currently with functional limitations due to the deficits listed below (see PT Problem List). Pt in bed when PT arrived. Pt agreeable to therapy intervention and motivated to ambulate. Pt required increased time, S and use of hospital bed for supine to sit, sit to stand  from EOB without AD with S and min cues, gait tasks in hallway without AD with CGA and min cues 120 x 3 with seated therapeutic rest break between each amb bout:  first amb bout pt on 6 L/min and O2 saturation 96% with exertion, second amb bout 4 L/min and pt maintained O2 >/=94% and third amb bout pt desaturated to 92% on 3 L/min. Pt seated in recliner and elected to briefly doff [place] with supplemental O2 and 92% at rest. [place] tube at 4 L/min as PT had found pt and 98% at rest, pt left seated in recliner and all needs in place.   Pt will benefit from acute skilled PT to increase their independence and safety with mobility to allow discharge.      If plan is discharge home, recommend the following: A little help with walking and/or transfers;A little help with bathing/dressing/bathroom;Assistance with cooking/housework;Assist for transportation;Help with stairs or ramp for entrance   Can travel by private vehicle        Equipment Recommendations  None recommended by PT    Recommendations for Other Services       Precautions / Restrictions Precautions Precautions: Fall Precaution/Restrictions Comments: Monitor SpO2 Restrictions Weight  Bearing Restrictions Per Provider Order: No     Mobility  Bed Mobility Overal bed mobility: Needs Assistance Bed Mobility: Supine to Sit     Supine to sit: Supervision, Used rails, HOB elevated     General bed mobility comments: min cues    Transfers Overall transfer level: Modified independent Equipment used: None Transfers: Sit to/from Stand Sit to Stand: Supervision           General transfer comment: pt requried min cues for safety and [place]/line lead management for bed, recliner and commode transfers    Ambulation/Gait Ambulation/Gait assistance: Contact guard assist Gait Distance (Feet): 120 Feet Assistive device: None Gait Pattern/deviations: Step-through pattern, Staggering left, Staggering right Gait velocity: decreased     General Gait Details: occational lateral drift L or R, no overt LOB without AD, min cues for coordinated breathing   Stairs             Wheelchair Mobility     Tilt Bed    Modified Rankin (Stroke Patients Only)       Balance Overall balance assessment: Independent Sitting-balance support: Feet supported, No upper extremity supported Sitting balance-Leahy Scale: Good     Standing balance support: No upper extremity supported, During functional activity Standing balance-Leahy Scale: Good                              Communication Communication Communication: No apparent difficulties  Cognition Arousal: Alert Behavior During Therapy: [facility] for  tasks assessed/performed   PT - Cognitive impairments: No apparent impairments                         Following commands: Intact      Cueing    Exercises      General Comments        Pertinent Vitals/Pain Pain Assessment Pain Assessment: 0-10 Faces Pain Scale: Hurts even more Pain Location: back and  Right upper arm. reports slid from rollator that was not locked Pain Descriptors / Indicators: Grimacing, Discomfort Pain Intervention(s):  Monitored during session    Home Living                          Prior Function            PT Goals (current goals can now be found in the care plan section) Acute Rehab PT Goals Patient Stated Goal: go home PT Goal Formulation: With patient Time For Goal Achievement: 01/28/24 Potential to Achieve Goals: Fair Progress towards PT goals: Progressing toward goals    Frequency    Min 3X/week      PT Plan      Co-evaluation              AM-PAC PT 6 Clicks Mobility   Outcome Measure  Help needed turning from your back to your side while in a flat bed without using bedrails?: None Help needed moving from lying on your back to sitting on the side of a flat bed without using bedrails?: None Help needed moving to and from a bed to a chair (including a wheelchair)?: A Little Help needed standing up from a chair using your arms (e.g., wheelchair or bedside chair)?: A Little Help needed to walk in hospital room?: A Little Help needed climbing 3-5 steps with a railing? : A Lot 6 Click Score: 19    End of Session Equipment Utilized During Treatment: Oxygen;Gait belt Activity Tolerance: Patient tolerated treatment well;No increased pain Patient left: in chair;with call bell/phone within reach;with chair alarm set Nurse Communication: Mobility status PT Visit Diagnosis: Muscle weakness (generalized) (M62.81);Difficulty in walking, not elsewhere classified (R26.2)     Time: 8461-8392 PT Time Calculation (min) (ACUTE ONLY): 29 min  Charges:    $Gait Training: 8-22 mins $Therapeutic Activity: 8-22 mins PT General Charges $$ ACUTE PT VISIT: 1 Visit                     [place], PT Acute Rehab    [place] VEAR Drone 01/16/2024, 4:34 PM

## 2024-01-16 NOTE — Progress Notes (Signed)
 Progress Note   Patient: [name] FMW:[id number] DOB: Oct 08, 1955 DOA: 01/13/2024     3 DOS: the patient was seen and examined on 01/16/2024   Brief hospital course: 68yo with h/o AML on on palliative hydroxyurea , chronic thrombocytopenia, and recurrent loculated right-sided pleural effusion, recently admitted 9/29-10/2 for recurrent anemia and thrombocytopenia (dc on Levaquin  and Flagyl for 15 days for possible empyema) who presented on 10/7 with SOB and productive cough with mild hemoptysis.  Acute on chronic respiratory failure with O2 89% despite 2L home O2, increased to 4L.  Hgb 6.7, platelets <5, transfused 2u platelets + 1u PRBC. Started on IV vancomycin  and cefepime  due to concern for pneumonia but more likely leukemic rather than infectious per oncology.  Pulmonology also consulting, gave Lasix.  Oncology has encouraged hospice but she wants to continue transfusions.  She is DNR.  Assessment and Plan:  Acute on chronic hypoxic respite failure due to leukemic B pleural effusions, R > L Patient presented with shortness of breath, pleuritic chest pain, tachypnea with respiratory rate in the 30s, and hypoxia to 89% while on 2.5L [place] requiring titration up to 4L (recently discharged on 2L   Initial CXR showed stable large loculated right pleural effusion with minimal left pleural effusion CTA chest showed no evidence of PE, stable lymphadenopathy, and noted large bilateral pleural effusions with progression since prior study associated with compressive atelectasis or consolidation in the lung bases, as well as development of patchy airspace disease in bilateral lungs concerning for superimposed pneumonia or aspiration Pulmonology following - chronic right effusion less likely cause of dyspnea, likely malignant despite negative cytology on previous thoracentesis; also thought to be less likely infectious given that she was was on Levaquin  and Flagyl since her discharge Pulmonology recommending  Echo and Lasix challenge Echo (10/8) showed LVEF 60-65%, no regional wall motion abnormalities, normal RV systolic function, normal PASP, no valvular abnormalities Trialed IV Lasix 40 mg on 10/7, positive response IV Lasix 40 mg x 2 doses today, transition to PO scheduled versus PRN tomorrow Wean O2 as tolerated Continuous SpO2 monitoring Oncology also following (see below) Given very low suspicion for infectious etiology, will complete Levaquin /Flagyl as prior and deescalate antibiotics   Refractory AML Patient diagnosed with AML in 02/2023 Was previously on hydroxyurea  which was stopped during last admission Oncology following Worsening leukocytosis, currently 66.8 She is not a candidate for additional treatments She has had recurrent discussions regarding hospice; this AM, she reported that God will tell her when to stop fighting and as of now He still plans to cure her She wishes to continue blood and platelet transfusions as needed, generally needed every few days She previously received transfusions at [facility] and has recently transferred to [facility] She is a current palliative [facility] patient  Hemoptysis, mild Patient presented with mild streaked hemoptysis.  Improving.  Most likely secondary to persistent coughing with concomitant pulmonary edema in the setting of thrombocytopenia Transfusing as needed   Thrombocytopenia, severe Platelets <5K on admission, now status post transfusion 3 units of platelets Platelets down to 20K today Transfuse prn platelets <20   Anemia Hgb 6.7 on admission, now status post transfusion 1 unit of PRBCs Hgb stable at 8.2 today   Chronic hyponatremia Sodium 132 today Stable, chronic   Generalized weakness Mechanical fall Patient fell at home without any trauma to the head or loss of consciousness.  Fall precautions PT/OT evaluation ordered - declining home therapy  Pressure injury, POA Wound 01/13/24 2300 Pressure Injury Buttocks  Bilateral Stage 2 -  Partial thickness loss of dermis presenting as a shallow open injury with a red, pink wound bed without slough. (Active)         Consultants: Pulmonology Oncology Palliative care PT OT  Procedures: Echocardiogram 10/8  Antibiotics: Cefepime  10/7-10 Levaquin  10/10-18 Metronidazole 10/10-17  30 Day Unplanned Readmission Risk Score    Flowsheet Row ED to Hosp-Admission (Current) from 01/13/2024 in [facility]-ICU/STEPDOWN  30 Day Unplanned Readmission Risk Score (%) 38.52 Filed at 01/16/2024 0400    This score is the patient's risk of an unplanned readmission within 30 days of being discharged (0 -100%). The score is based on dignosis, age, lab data, medications, orders, and past utilization.   Low:  0-14.9   Medium: 15-21.9   High: 22-29.9   Extreme: 30 and above           Subjective: On 5L O2 after sats dropped to 83% while sleeping; patient not aware and very upset about this. Also upset because everyone is hammering her with hospice recommendation and she feels like everyone is giving up on her.   Objective: Vitals:   01/16/24 1400 01/16/24 1500  BP: (!) 132/52   Pulse: 87 94  Resp: 17 (!) 23  Temp:    SpO2: 96% 97%    Intake/Output Summary (Last 24 hours) at 01/16/2024 1627 Last data filed at 01/16/2024 1134 Gross per 24 hour  Intake 200 ml  Output --  Net 200 ml   Filed Weights   01/13/24 2247  Weight: 66.4 kg    Exam:  General:  Appears calm and comfortable and is in NAD, on 5L [place] O2 Eyes:  normal lids, iris ENT:  grossly normal hearing, lips & tongue, mmm Cardiovascular:  RRR. No LE edema.  Respiratory:   Scattered rhonchi.  Mildly increased respiratory effort. Abdomen:  soft, NT, ND Skin:  no rash or induration seen on limited exam Musculoskeletal:  grossly normal tone BUE/BLE, good ROM, no bony abnormality Psychiatric:  blunted/irritable mood and affect, speech fluent and appropriate,  AOx3 Neurologic:  CN 2-12 grossly intact, moves all extremities in coordinated fashion  Data Reviewed: I have reviewed the patient's lab results since admission.  Pertinent labs for today include:   Na++ 132, stable  Procalcitoninin 0.54 WBC 66.8 Hgb 8.2 Platelets 24     Family Communication: None present; she declined to have me call family today  Mobility: PT/OT Consulted and are recommending - No Pt Follow Up (Pt Declines)01/14/2024 1501    Code Status: Limited: Do not attempt resuscitation (DNR) -DNR-LIMITED -Do Not Intubate/DNI     Disposition: Status is: Inpatient Remains inpatient appropriate because: ongoing management     Time spent: 50 minutes  Unresulted Labs (From admission, onward)     Start     Ordered   01/16/24 0500  CBC  Daily,   R     Question:  Specimen collection method  Answer:  Unit=Unit collect   01/15/24 0523   01/16/24 0500  Basic metabolic panel with GFR  Daily,   R     Question:  Specimen collection method  Answer:  Unit=Unit collect   01/15/24 0523   01/13/24 2316  Culture, Respiratory w Gram Stain  Once,   R        01/13/24 2316             Author: Delon Herald, MD 01/16/2024 4:27 PM  For on call review [email or web address].

## 2024-01-16 NOTE — Progress Notes (Addendum)
 [facility] Liaison Note:   This is a current outpatient palliative patient with [facility].   Liaisons will continue to follow for discharge disposition.   Please call with any palliative or hospice related questions or concerns.   Thank you, Eleanor Nail, [facility] liaison  [phone]

## 2024-01-16 NOTE — Plan of Care (Signed)

## 2024-01-17 DIAGNOSIS — J90 Pleural effusion, not elsewhere classified: Secondary | ICD-10-CM | POA: Diagnosis not present

## 2024-01-17 DIAGNOSIS — C9200 Acute myeloblastic leukemia, not having achieved remission: Secondary | ICD-10-CM | POA: Diagnosis not present

## 2024-01-17 LAB — CBC
HCT: 22 % — ABNORMAL LOW (ref 36.0–46.0)
Hemoglobin: 7.1 g/dL — ABNORMAL LOW (ref 12.0–15.0)
MCH: 28.7 pg (ref 26.0–34.0)
MCHC: 32.3 g/dL (ref 30.0–36.0)
MCV: 89.1 fL (ref 80.0–100.0)
Platelets: 14 K/uL — CL (ref 150–400)
RBC: 2.47 MIL/uL — ABNORMAL LOW (ref 3.87–5.11)
RDW: 15.8 % — ABNORMAL HIGH (ref 11.5–15.5)
WBC: 58.5 K/uL (ref 4.0–10.5)
nRBC: 0 % (ref 0.0–0.2)

## 2024-01-17 LAB — BASIC METABOLIC PANEL WITH GFR
Anion gap: 6 (ref 5–15)
BUN: 13 mg/dL (ref 8–23)
CO2: 30 mmol/L (ref 22–32)
Calcium: 8.2 mg/dL — ABNORMAL LOW (ref 8.9–10.3)
Chloride: 94 mmol/L — ABNORMAL LOW (ref 98–111)
Creatinine, Ser: 0.55 mg/dL (ref 0.44–1.00)
GFR, Estimated: >60 mL/min (ref >60)
Glucose, Bld: 84 mg/dL (ref 70–99)
Potassium: 4.2 mmol/L (ref 3.5–5.1)
Sodium: 129 mmol/L — ABNORMAL LOW (ref 135–145)

## 2024-01-17 LAB — CBC WITH DIFFERENTIAL/PLATELET
Abs Immature Granulocytes: 6.05 K/uL — ABNORMAL HIGH (ref 0.00–0.07)
Basophils Absolute: 0.1 K/uL (ref 0.0–0.1)
Basophils Relative: 0 %
Eosinophils Absolute: 0 K/uL (ref 0.0–0.5)
Eosinophils Relative: 0 %
HCT: 23.7 % — ABNORMAL LOW (ref 36.0–46.0)
Hemoglobin: 7.7 g/dL — ABNORMAL LOW (ref 12.0–15.0)
Immature Granulocytes: 11 %
Lymphocytes Relative: 28 %
Lymphs Abs: 14.9 K/uL — ABNORMAL HIGH (ref 0.7–4.0)
MCH: 28.6 pg (ref 26.0–34.0)
MCHC: 32.5 g/dL (ref 30.0–36.0)
MCV: 88.1 fL (ref 80.0–100.0)
Monocytes Absolute: 26.9 K/uL — ABNORMAL HIGH (ref 0.1–1.0)
Monocytes Relative: 51 %
Neutro Abs: 5.5 K/uL (ref 1.7–7.7)
Neutrophils Relative %: 10 %
Platelets: 28 K/uL — CL (ref 150–400)
RBC: 2.69 MIL/uL — ABNORMAL LOW (ref 3.87–5.11)
RDW: 15.8 % — ABNORMAL HIGH (ref 11.5–15.5)
WBC Morphology: ABNORMAL
WBC: 53.4 K/uL (ref 4.0–10.5)
nRBC: 0 % (ref 0.0–0.2)

## 2024-01-17 MED ORDER — SODIUM CHLORIDE 0.9% IV SOLUTION: Freq: Once | INTRAVENOUS | Status: DC

## 2024-01-17 MED ORDER — SODIUM CHLORIDE 0.9% IV SOLUTION: Freq: Once | INTRAVENOUS | Status: AC

## 2024-01-17 NOTE — Plan of Care (Signed)
  Problem: Education: Goal: Knowledge of General Education information will improve Description: Including pain rating scale, medication(s)/side effects and non-pharmacologic comfort measures Outcome: Progressing   Problem: Health Behavior/Discharge Planning: Goal: Ability to manage health-related needs will improve Outcome: Progressing   Problem: Clinical Measurements: Goal: Ability to maintain clinical measurements within normal limits will improve Outcome: Progressing Goal: Respiratory complications will improve Outcome: Progressing Goal: Cardiovascular complication will be avoided Outcome: Progressing   Problem: Activity: Goal: Risk for activity intolerance will decrease Outcome: Progressing   Problem: Nutrition: Goal: Adequate nutrition will be maintained Outcome: Progressing   Problem: Coping: Goal: Level of anxiety will decrease Outcome: Progressing   Problem: Clinical Measurements: Goal: Will remain free from infection Outcome: Not Progressing Goal: Diagnostic test results will improve Outcome: Not Progressing

## 2024-01-17 NOTE — Progress Notes (Signed)
 Progress Note   Patient: [name] FMW:[id number] DOB: Mar 10, 1956 DOA: 01/13/2024     4 DOS: the patient was seen and examined on 01/17/2024   Brief hospital course: 68yo with h/o AML on on palliative hydroxyurea , chronic thrombocytopenia, and recurrent loculated right-sided pleural effusion, recently admitted 9/29-10/2 for recurrent anemia and thrombocytopenia (dc on Levaquin  and Flagyl for 15 days for possible empyema) who presented on 10/7 with SOB and productive cough with mild hemoptysis.  Acute on chronic respiratory failure with O2 89% despite 2L home O2, increased to 4L.  Hgb 6.7, platelets <5, transfused 2u platelets + 1u PRBC. Started on IV vancomycin  and cefepime  due to concern for pneumonia but more likely leukemic rather than infectious per oncology.  Pulmonology also consulting, gave Lasix.  Oncology has encouraged hospice but she wants to continue transfusions.  She is DNR.  Assessment and Plan:  Acute on chronic hypoxic respite failure due to leukemic B pleural effusions, R > L Patient presented with shortness of breath, pleuritic chest pain, tachypnea with respiratory rate in the 30s, and hypoxia to 89% while on 2.5L  requiring titration up to 4L (recently discharged on 2L   Initial CXR showed stable large loculated right pleural effusion with minimal left pleural effusion CTA chest showed no evidence of PE, stable lymphadenopathy, and noted large bilateral pleural effusions with progression since prior study associated with compressive atelectasis or consolidation in the lung bases, as well as development of patchy airspace disease in bilateral lungs concerning for superimposed pneumonia or aspiration Pulmonology following - chronic right effusion less likely cause of dyspnea, likely malignant despite negative cytology on previous thoracentesis; also thought to be less likely infectious given that she was was on Levaquin  and Flagyl since her discharge Pulmonology recommending  Echo and Lasix challenge Echo (10/8) showed LVEF 60-65%, no regional wall motion abnormalities, normal RV systolic function, normal PASP, no valvular abnormalities Trialed IV Lasix 40 mg on 10/7, positive response IV Lasix 40 mg x 2 doses today, transition to PO scheduled versus PRN tomorrow Wean O2 as tolerated Continuous SpO2 monitoring Oncology also following (see below) Given very low suspicion for infectious etiology, will complete Levaquin /Flagyl as prior without additional coverage   Refractory AML Patient diagnosed with AML in 02/2023 Was previously on hydroxyurea  which was stopped during last admission Oncology following Worsening leukocytosis She is not a candidate for additional treatments She has had recurrent discussions regarding hospice but is not ready to transition to comfort measures at this time She wishes to continue blood and platelet transfusions as needed, generally needed every few days She previously received outpatient transfusions at [facility] and has recently transferred to [facility] She is a current palliative [facility] patient   Hemoptysis, mild Patient presented with mild streaked hemoptysis.  Improving.  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  Fall precautions PT/OT evaluation ordered - declining home therapy   Pressure injury, POA Wound 01/13/24  2300 Pressure  Injury Buttocks Bilateral Stage 2 -  Partial thickness loss of dermis presenting as a shallow open injury with a red, pink wound bed without slough. (Active)              Consultants: Pulmonology Oncology Palliative care PT OT   Procedures: Echocardiogram 10/8   Antibiotics: Cefepime  10/7-10 Levaquin  10/10-18 Metronidazole 10/10-17   30 Day Unplanned Readmission Risk Score    Flowsheet Row ED to Hosp-Admission (Current) from 01/13/2024 in [facility]ICU/STEPDOWN  30 Day Unplanned Readmission Risk Score (%) 38.99 Filed at 01/17/2024 0400    This score is the patient's risk of an unplanned readmission within 30 days of being discharged (0 -100%). The score is based on dignosis, age, lab data, medications, orders, and past utilization.   Low:  0-14.9   Medium: 15-21.9   High: 22-29.9   Extreme: 30 and above           Subjective: O2 on 3L, turned down to 2.  No new complaints.  Wants to ensure stability before dc.  Understands that this is tenuous.   Objective: Vitals:   01/17/24 1224 01/17/24 1300  BP:    Pulse:  88  Resp:  17  Temp: 97.8 F (36.6 C)   SpO2:  95%    Intake/Output Summary (Last 24 hours) at 01/17/2024 1423 Last data filed at 01/17/2024 1120 Gross per 24 hour  Intake 275 ml  Output --  Net 275 ml   Filed Weights   01/13/24 2247  Weight: 66.4 kg    Exam:  General:  Appears calm and comfortable and is in NAD, on 2L [place] O2 Eyes:  normal lids, iris ENT:  grossly normal hearing, lips & tongue, mmm Cardiovascular:  RRR. No LE edema.  Respiratory:   Scattered rhonchi.  Normal respiratory effort. Abdomen:  soft, NT, ND Skin:  no rash or induration seen on limited exam Musculoskeletal:  grossly normal tone BUE/BLE, good ROM, no bony abnormality Psychiatric:  blunted mood and affect, speech fluent and appropriate, AOx3 Neurologic:  CN 2-12 grossly intact, moves all extremities in coordinated fashion  Data  Reviewed: I have reviewed the patient's lab results since admission.  Pertinent labs for today include:   Na++ 129 WBC 58.5 Hgb 7.1,down from 8.2 Platelets 14     Family Communication: None present; I spoke with her granddaughter by telephone  Mobility: PT/OT Consulted and are recommending - No Pt Follow Up (Pt Declines)01/16/2024 1600    Code Status: Limited: Do not attempt resuscitation (DNR) -DNR-LIMITED -Do Not Intubate/DNI     Disposition: Status is: Inpatient Remains inpatient appropriate because: ongoing monitoring     Time spent: 50 minutes  Unresulted Labs (From admission, onward)     Start     Ordered   01/17/24 1340  CBC with Differential/Platelet  Once,   R       Question:  Specimen collection method  Answer:  Unit=Unit collect   01/17/24 1141   01/16/24 0500  CBC  Daily,   R     Question:  Specimen collection method  Answer:  Unit=Unit collect   01/15/24 0523   01/16/24 0500  Basic metabolic panel with GFR  Daily,   R     Question:  Specimen collection method  Answer:  Unit=Unit collect   01/15/24 0523             Author: Delon Herald, MD 01/17/2024 2:23 PM  For on call review [email or web address].

## 2024-01-17 NOTE — Progress Notes (Signed)
 Physical Therapy Treatment Patient Details Name: [name] MRN: [id number] DOB: [birth year] Today's Date: 01/17/2024   History of Present Illness Patient is a 68 year old female who presented to the ED with c/o shortness of breath and productive cough; also endorsed falling at home.  Dx with bilateral pleural effusion and PNA.  PMHx includes CAD, thrombocytopenia, cholecystectomy, acute myeloid leukemia (current), left foot surgery    PT Comments  Pt ambulated in hallway and able to improve distance today.  Pt also remained on her baseline 2L O2 [place] and SpO2 97-99% during ambulation.    If plan is discharge home, recommend the following: A little help with walking and/or transfers;A little help with bathing/dressing/bathroom;Assistance with cooking/housework;Assist for transportation;Help with stairs or ramp for entrance   Can travel by private vehicle        Equipment Recommendations  None recommended by PT    Recommendations for Other Services       Precautions / Restrictions Precautions Precautions: Fall Precaution/Restrictions Comments: Monitor SpO2, uses 2L at baseline     Mobility  Bed Mobility Overal bed mobility: Needs Assistance Bed Mobility: Supine to Sit     Supine to sit: Supervision, Used rails, HOB elevated          Transfers Overall transfer level: Needs assistance Equipment used: Rolling walker (2 wheels) Transfers: Sit to/from Stand Sit to Stand: Supervision           General transfer comment: cues for hand placement (utilized RW today as precaution since pt reports feeling a little woozy from medication earlier)    Ambulation/Gait Ambulation/Gait assistance: Contact guard assist Gait Distance (Feet): 360 Feet Assistive device: Rolling walker (2 wheels) Gait Pattern/deviations: Step-through pattern, Decreased stride length Gait velocity: decreased     General Gait Details: steady with RW, one standing rest break due to fatigue; SpO2  remained 97-99% on pt's baseline 2L O2 [name]   Stairs             Wheelchair Mobility     Tilt Bed    Modified Rankin (Stroke Patients Only)       Balance                                            Communication Communication Communication: No apparent difficulties  Cognition Arousal: Alert Behavior During Therapy: WFL for tasks assessed/performed   PT - Cognitive impairments: No apparent impairments                         Following commands: Intact      Cueing    Exercises General Exercises - Lower Extremity Ankle Circles/Pumps: AROM, 5 reps, Both Long Arc Quad: AROM, Both, 5 reps Hip Flexion/Marching: AROM, 5 reps, Both    General Comments        Pertinent Vitals/Pain Pain Assessment Pain Assessment: No/denies pain Pain Intervention(s): Repositioned, Monitored during session    Home Living                          Prior Function            PT Goals (current goals can now be found in the care plan section) Progress towards PT goals: Progressing toward goals    Frequency    Min 3X/week      PT Plan  Co-evaluation              AM-PAC PT 6 Clicks Mobility   Outcome Measure  Help needed turning from your back to your side while in a flat bed without using bedrails?: None Help needed moving from lying on your back to sitting on the side of a flat bed without using bedrails?: None Help needed moving to and from a bed to a chair (including a wheelchair)?: A Little Help needed standing up from a chair using your arms (e.g., wheelchair or bedside chair)?: A Little Help needed to walk in hospital room?: A Little Help needed climbing 3-5 steps with a railing? : A Little 6 Click Score: 20    End of Session Equipment Utilized During Treatment: Oxygen;Gait belt Activity Tolerance: Patient tolerated treatment well Patient left: in chair;with call bell/phone within reach;with family/visitor  present (family and pt aware for pt to call for assist back to bed (mostly due to lines))   PT Visit Diagnosis: Muscle weakness (generalized) (M62.81);Difficulty in walking, not elsewhere classified (R26.2)     Time: 8484-8470 PT Time Calculation (min) (ACUTE ONLY): 14 min  Charges:    $Gait Training: 8-22 mins PT General Charges $$ ACUTE PT VISIT: 1 Visit                     Tari KLEIN, DPT Physical Therapist Acute Rehabilitation Services Office: [phone]  Tari CROME [name] 01/17/2024, 3:54 PM

## 2024-01-18 ENCOUNTER — Other Ambulatory Visit (HOSPITAL_COMMUNITY): Payer: Self-pay

## 2024-01-18 ENCOUNTER — Other Ambulatory Visit: Payer: Self-pay

## 2024-01-18 ENCOUNTER — Encounter: Payer: Self-pay | Admitting: Oncology

## 2024-01-18 DIAGNOSIS — J90 Pleural effusion, not elsewhere classified: Secondary | ICD-10-CM | POA: Diagnosis not present

## 2024-01-18 LAB — BASIC METABOLIC PANEL WITH GFR
Anion gap: 8 (ref 5–15)
BUN: 11 mg/dL (ref 8–23)
CO2: 30 mmol/L (ref 22–32)
Calcium: 8.9 mg/dL (ref 8.9–10.3)
Chloride: 94 mmol/L — ABNORMAL LOW (ref 98–111)
Creatinine, Ser: 0.53 mg/dL (ref 0.44–1.00)
GFR, Estimated: >60 mL/min (ref >60)
Glucose, Bld: 99 mg/dL (ref 70–99)
Potassium: 4.2 mmol/L (ref 3.5–5.1)
Sodium: 131 mmol/L — ABNORMAL LOW (ref 135–145)

## 2024-01-18 MED ORDER — ROBAFEN DM 20-200 MG/20ML PO LIQD: 5.0000 mL | ORAL | 0 refills | Status: DC | PRN

## 2024-01-18 MED ORDER — ROBAFEN DM 20-200 MG/20ML PO LIQD
5.0000 mL | ORAL | 0 refills | Status: DC | PRN
  Filled 2024-01-18: qty 118, 4d supply, fill #0

## 2024-01-18 MED ORDER — ENSURE PLUS HIGH PROTEIN PO LIQD
237.0000 mL | Freq: Two times a day (BID) | ORAL | 0 refills | Status: DC
  Filled 2024-01-18: qty 14220, 30d supply, fill #0

## 2024-01-18 MED ORDER — FUROSEMIDE 40 MG PO TABS
40.0000 mg | ORAL_TABLET | Freq: Every day | ORAL | 0 refills | Status: DC | PRN
  Filled 2024-01-18: qty 30, 30d supply, fill #0

## 2024-01-18 MED ORDER — FUROSEMIDE 40 MG PO TABS: 40.0000 mg | ORAL_TABLET | Freq: Every day | ORAL | 0 refills | Status: AC | PRN

## 2024-01-18 MED ORDER — ENSURE PLUS HIGH PROTEIN PO LIQD: 237.0000 mL | Freq: Two times a day (BID) | ORAL | 0 refills | Status: DC

## 2024-01-18 NOTE — Plan of Care (Signed)
  Problem: Clinical Measurements: Goal: Ability to maintain clinical measurements within normal limits will improve Outcome: Progressing Goal: Will remain free from infection Outcome: Progressing Goal: Diagnostic test results will improve Outcome: Progressing Goal: Respiratory complications will improve Outcome: Progressing Goal: Cardiovascular complication will be avoided Outcome: Progressing   Problem: Activity: Goal: Risk for activity intolerance will decrease Outcome: Progressing   Problem: Nutrition: Goal: Adequate nutrition will be maintained Outcome: Progressing   Problem: Coping: Goal: Level of anxiety will decrease Outcome: Not Progressing

## 2024-01-18 NOTE — Discharge Summary (Addendum)
 Physician Discharge Summary   Patient: [name] MRN: [id number] DOB: 1956/01/29  Admit date:     01/13/2024  Discharge date: 01/18/24  Discharge Physician: Delon Herald   PCP: Lanny Callander, MD   Recommendations at discharge:   Continue to closely follow with Dr. Lanny, as you are likely to need frequent blood and platelet transfusions Next appointment with oncology is tomorrow, 10/13 Resume Levaquin /Flagyl and complete as per prior hospital discharge  Discharge Diagnoses: Principal Problem:   Pleural effusion Active Problems:   Acute myeloid leukemia not having achieved remission (HCC)   Anemia   Thrombocytopenia   Chronic hyponatremia   Acute hypoxic respiratory failure (HCC)   Pressure injury of skin    Hospital Course: 68yo with h/o AML on on palliative hydroxyurea , chronic thrombocytopenia, and recurrent loculated right-sided pleural effusion, recently admitted 9/29-10/2 for recurrent anemia and thrombocytopenia (dc on Levaquin  and Flagyl for 15 days for possible empyema) who presented on 10/7 with SOB and productive cough with mild hemoptysis.  Acute on chronic respiratory failure with O2 89% despite 2L home O2, increased to 4L.  Hgb 6.7, platelets <5, transfused 2u platelets + 1u PRBC. Started on IV vancomycin  and cefepime  due to concern for pneumonia but more likely leukemic rather than infectious per oncology.  Pulmonology also consulting, gave Lasix.  Oncology has encouraged hospice but she wants to continue transfusions.  She is DNR.  Assessment and Plan:  Acute on chronic hypoxic respite failure due to leukemic B pleural effusions, R > L Patient presented with shortness of breath, pleuritic chest pain, tachypnea with respiratory rate in the 30s, and hypoxia to 89% while on 2.5L [place] requiring titration up to 4L (recently discharged on 2L   Initial CXR showed stable large loculated right pleural effusion with minimal left pleural effusion CTA chest showed no evidence of  PE, stable lymphadenopathy, and noted large bilateral pleural effusions with progression since prior study associated with compressive atelectasis or consolidation in the lung bases, as well as development of patchy airspace disease in bilateral lungs concerning for superimposed pneumonia or aspiration Pulmonology following - chronic right effusion less likely cause of dyspnea, likely malignant despite negative cytology on previous thoracentesis; also thought to be less likely infectious given that she was was on Levaquin  and Flagyl since her discharge Pulmonology recommending Echo and Lasix challenge Echo (10/8) showed LVEF 60-65%, no regional wall motion abnormalities, normal RV systolic function, normal PASP, no valvular abnormalities Trialed IV Lasix 40 mg on 10/7, positive response IV Lasix 40 mg x 2 doses today, transition to PO PRN Wean O2 as tolerated Continuous SpO2 monitoring Oncology also following (see below) Given very low suspicion for infectious etiology, will complete Levaquin /Flagyl as prior without additional coverage Will provide Mucinex  for cough   Refractory AML Patient diagnosed with AML in 02/2023 Was previously on hydroxyurea  which was stopped during last admission Oncology following Worsening leukocytosis She is not a candidate for additional treatments She has had recurrent discussions regarding hospice but is not ready to transition to comfort measures at this time She wishes to continue blood and platelet transfusions as needed, generally needed every few days She previously received outpatient transfusions at [facility] and has recently transferred to [facility] She is a current palliative [facility] patient   Hemoptysis, mild Patient presented with mild streaked hemoptysis, improving Most likely secondary to persistent coughing with concomitant pulmonary edema in the setting of thrombocytopenia Hgb currently stable   Thrombocytopenia, severe Platelets <5K on  admission, now status  post transfusion 3 units of platelets Platelets >20 so no transfusion today Transfuse prn platelets <20   Anemia Hgb 6.7 on admission, now status post transfusion 1 unit of PRBCs Hgb stable for now F/u outpatient for transfusions   Chronic hyponatremia Sodium 131 today Stable, chronic, 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  Fall precautions PT/OT evaluation ordered - declining home therapy R upper arm wound on presentation is now scabbed over, can cover with vaseline until healed   Pressure injury, POA Wound 01/13/24 2300 Pressure Injury Buttocks Bilateral Stage 2 -  Partial thickness loss of dermis presenting as a shallow open injury with a red, pink wound bed without slough. (Active)              Consultants: Pulmonology Oncology Palliative care PT OT   Procedures: Echocardiogram 10/8   Antibiotics: Cefepime  10/7-10 Levaquin  10/10-18 Metronidazole 10/10-17      Pain control - [place]  Controlled Substance Reporting System database was reviewed. and patient was instructed, not to drive, operate heavy machinery, perform activities at heights, swimming or participation in water activities or provide baby-sitting services while on Pain, Sleep and Anxiety Medications; until their outpatient Physician has advised to do so again. Also recommended to not to take more than prescribed Pain, Sleep and Anxiety Medications.   Disposition: Home Diet recommendation:  Regular diet DISCHARGE MEDICATION: Allergies as of 01/18/2024       Reactions   Penicillin G Anaphylaxis, Other (See Comments)   Has tolerated Rocephin  and Cefepime  on multiple occasions   Prednisone Anaphylaxis, Shortness Of Breath, Swelling, Other (See Comments)   Pt states it turned her into a monster, lips started swelling and couldn't breathe   Shellfish Allergy  Anaphylaxis, Swelling   Codeine Nausea And Vomiting   Heparin  Other (See Comments)   Passed out    Oxycodone  Nausea And Vomiting        Medication List     TAKE these medications    acetaminophen  325 MG tablet Commonly known as: TYLENOL  Take 2 tablets (650 mg total) by mouth every 6 (six) hours as needed for mild pain (pain score 1-3) or fever (or Fever >/= 101).   ALPRAZolam  0.5 MG tablet Commonly known as: XANAX  Take 0.5 mg by mouth 2 (two) times daily as needed for anxiety.   ascorbic acid  500 MG tablet Commonly known as: VITAMIN C  Take 500 mg by mouth daily.   b complex vitamins capsule Take 1 capsule by mouth daily.   cholecalciferol  10 MCG (400 UNIT) Tabs tablet Commonly known as: VITAMIN D3 Take 400 Units by mouth daily.   feeding supplement Liqd Take 237 mLs by mouth 2 (two) times daily between meals.   furosemide 40 MG tablet Commonly known as: LASIX Take 1 tablet (40 mg total) by mouth daily as needed for up to 18 days for fluid or edema.   hydrOXYzine  25 MG tablet Commonly known as: ATARAX  Take 1 tablet (25 mg total) by mouth 3 (three) times daily as needed for anxiety.   ipratropium-albuterol  0.5-2.5 (3) MG/3ML Soln Commonly known as: DUONEB Take 3 mLs by nebulization every 6 (six) hours as needed (Shortness of breath, cough and/or wheeze).   levofloxacin  750 MG tablet Commonly known as: LEVAQUIN  Take 1 tablet (750 mg total) by mouth daily for 15 days.   metroNIDAZOLE 500 MG tablet Commonly known as: FLAGYL Take 1 tablet (500 mg  total) by mouth every 12 (twelve) hours for 15 days.   nicotine  21 mg/24hr patch Commonly known as: NICODERM CQ  - dosed in mg/24 hours Place 1 patch (21 mg total) onto the skin daily.   ondansetron  8 MG tablet Commonly known as: ZOFRAN  Take 8 mg by mouth every 8 (eight) hours as needed for nausea or vomiting.   pantoprazole  20 MG tablet Commonly known as: PROTONIX  TAKE ONE TABLET (20 MG TOTAL) BY MOUTH DAILY. What  changed: See the new instructions.   prochlorperazine  10 MG tablet Commonly known as: COMPAZINE  Take 10 mg by mouth every 6 (six) hours as needed for nausea or vomiting.   promethazine  25 MG tablet Commonly known as: PHENERGAN  Take 25 mg by mouth every 6 (six) hours as needed for nausea.   Robafen DM 20-200 MG/20ML Liqd Generic drug: Dextromethorphan -guaiFENesin  Take 5 mLs by mouth every 4 (four) hours as needed.   traMADol  50 MG tablet Commonly known as: ULTRAM  Take 50 mg by mouth every 6 (six) hours as needed for moderate pain (pain score 4-6).               Discharge Care Instructions  (From admission, onward)           Start     Ordered   01/18/24 0000  Discharge wound care:       Comments: Keep R arm wound covered with vaseline during healing   01/18/24 9062            Follow-up Information     Lanny Callander, MD Follow up.   Specialties: Hematology, Oncology Why: Appointment for blood work and oncology visit as scheduled Contact information: [address] [place] [address] [phone]                Discharge Exam:   Subjective: Feels good today and is thrilled to go home.  Understands importance of ongoing follow up.   Objective: Vitals:   01/18/24 0908 01/18/24 1000  BP:    Pulse: 97 (!) 106  Resp: 18 (!) 24  Temp: 98 F (36.7 C)   SpO2: 95% 96%    Intake/Output Summary (Last 24 hours) at 01/18/2024 1142 Last data filed at 01/17/2024 2300 Gross per 24 hour  Intake --  Output 1000 ml  Net -1000 ml   Filed Weights   01/13/24 2247  Weight: 66.4 kg    Exam:  General:  Appears calm and comfortable and is in NAD, on 2L  O2, chronically ill-appearing and frail Eyes:  normal lids, iris ENT:  grossly normal hearing, lips & tongue, mmm Cardiovascular:  RRR. No LE edema.  Respiratory:   Scattered rhonchi.  Normal respiratory effort. Abdomen:  soft, NT, ND Skin:  no rash or induration seen on limited  exam Musculoskeletal:  grossly normal tone BUE/BLE, good ROM, no bony abnormality Psychiatric:  blunted mood and affect, speech fluent and appropriate, AOx3 Neurologic:  CN 2-12 grossly intact, moves all extremities in coordinated fashion  Data Reviewed: I have reviewed the patient's lab results since admission.  Pertinent labs for today include:   Na++ 131 WBC 59.2 Hgb 7.8 Platelets 23    Condition at discharge: fair  The results of significant diagnostics from this hospitalization (including imaging, microbiology, ancillary and laboratory) are listed below for reference.   Imaging Studies: ECHOCARDIOGRAM COMPLETE Result Date: 01/14/2024    ECHOCARDIOGRAM REPORT   Patient Name:   ALDORA PERMAN Willinger Date of Exam: 01/14/2024 Medical Rec #:  [id number]  Height:       65.0 in Accession #:    [id number]     Weight:       146.4 lb Date of Birth:  July 28, 1955      BSA:          1.732 m Patient Age:    68 years       BP:           138/47 mmHg Patient Gender: F              HR:           98 bpm. Exam Location:  Inpatient Procedure: 2D Echo, Cardiac Doppler and Color Doppler (Both Spectral and Color            Flow Doppler were utilized during procedure). Indications:    Dyspnea R06.00  History:        Patient has no prior history of Echocardiogram examinations.  Sonographer:    Tinnie Gosling RDCS Referring Phys: [id number] PETER E BABCOCK IMPRESSIONS  1. Left ventricular ejection fraction, by estimation, is 60 to 65%. The left ventricle has normal function. The left ventricle has no regional wall motion abnormalities. Indeterminate diastolic filling due to E-A fusion.  2. Right ventricular systolic function is normal. The right ventricular size is normal. There is normal pulmonary artery systolic pressure.  3. The mitral valve is normal in structure. No evidence of mitral valve regurgitation. No evidence of mitral stenosis.  4. The aortic valve is tricuspid. Aortic valve regurgitation is not visualized. Aortic  valve sclerosis/calcification is present, without any evidence of aortic stenosis.  5. The inferior vena cava is normal in size with greater than 50% respiratory variability, suggesting right atrial pressure of 3 mmHg. Comparison(s): No prior Echocardiogram. FINDINGS  Left Ventricle: Left ventricular ejection fraction, by estimation, is 60 to 65%. The left ventricle has normal function. The left ventricle has no regional wall motion abnormalities. The left ventricular internal cavity size was normal in size. There is  no left ventricular hypertrophy. Indeterminate diastolic filling due to E-A fusion. Right Ventricle: The right ventricular size is normal. No increase in right ventricular wall thickness. Right ventricular systolic function is normal. There is normal pulmonary artery systolic pressure. The tricuspid regurgitant velocity is 2.40 m/s, and  with an assumed right atrial pressure of 3 mmHg, the estimated right ventricular systolic pressure is 26.0 mmHg. Left Atrium: Left atrial size was normal in size. Right Atrium: Right atrial size was normal in size. Pericardium: There is no evidence of pericardial effusion. Mitral Valve: The mitral valve is normal in structure. No evidence of mitral valve regurgitation. No evidence of mitral valve stenosis. Tricuspid Valve: The tricuspid valve is normal in structure. Tricuspid valve regurgitation is trivial. No evidence of tricuspid stenosis. Aortic Valve: The aortic valve is tricuspid. Aortic valve regurgitation is not visualized. Aortic valve sclerosis/calcification is present, without any evidence of aortic stenosis. Pulmonic Valve: The pulmonic valve was normal in structure. Pulmonic valve regurgitation is not visualized. No evidence of pulmonic stenosis. Aorta: The aortic root is normal in size and structure. Venous: The inferior vena cava is normal in size with greater than 50% respiratory variability, suggesting right atrial pressure of 3 mmHg. IAS/Shunts: No  atrial level shunt detected by color flow Doppler.  LEFT VENTRICLE PLAX 2D LVIDd:         4.50 cm   Diastology LVIDs:         2.50 cm   LV e' lateral: 6.09  cm/s LV PW:         1.00 cm LV IVS:        1.00 cm LVOT diam:     1.90 cm LV SV:         57 LV SV Index:   33 LVOT Area:     2.84 cm  RIGHT VENTRICLE             IVC RV S prime:     14.00 cm/s  IVC diam: 1.20 cm TAPSE (M-mode): 2.2 cm LEFT ATRIUM             Index        RIGHT ATRIUM           Index LA diam:        3.40 cm 1.96 cm/m   RA Area:     10.70 cm LA Vol (A2C):   43.4 ml 25.05 ml/m  RA Volume:   22.60 ml  13.05 ml/m LA Vol (A4C):   23.7 ml 13.68 ml/m LA Biplane Vol: 34.6 ml 19.97 ml/m  AORTIC VALVE LVOT Vmax:   122.00 cm/s LVOT Vmean:  73.500 cm/s LVOT VTI:    0.202 m  AORTA Ao Root diam: 3.30 cm Ao Asc diam:  3.00 cm TRICUSPID VALVE TR Peak grad:   23.0 mmHg TR Vmax:        240.00 cm/s  SHUNTS Systemic VTI:  0.20 m Systemic Diam: 1.90 cm Emeline Calender Electronically signed by Emeline Calender Signature Date/Time: 01/14/2024/5:26:43 PM    Final    CT Angio Chest PE W and/or Wo Contrast Result Date: 01/13/2024 CLINICAL DATA:  Pulmonary embolus suspected with high probability. Chest pain and shortness of breath. EXAM: CT ANGIOGRAPHY CHEST WITH CONTRAST TECHNIQUE: Multidetector CT imaging of the chest was performed using the standard protocol during bolus administration of intravenous contrast. Multiplanar CT image reconstructions and MIPs were obtained to evaluate the vascular anatomy. RADIATION DOSE REDUCTION: This exam was performed according to the departmental dose-optimization program which includes automated exposure control, adjustment of the mA and/or kV according to patient size and/or use of iterative reconstruction technique. CONTRAST:  75mL OMNIPAQUE  IOHEXOL  350 MG/ML SOLN COMPARISON:  Chest radiograph 01/13/2024.  CT chest 01/05/2024 FINDINGS: Cardiovascular: Technically adequate study with good opacification of the central and segmental  pulmonary arteries. No focal filling defects. No evidence of significant pulmonary embolus. Normal heart size. No pericardial effusion. Normal caliber thoracic aorta. No aortic dissection. Great vessel origins are patent. Calcification of the aorta and coronary arteries. Mediastinum/Nodes: Esophagus is decompressed. Prominent mediastinal, hilar, and axillary lymphadenopathy. Largest aortopulmonary window lymph nodes measure 1.9 cm in short axis dimension. Pretracheal and right paratracheal lymph nodes measure up to 1.8 cm short axis dimension. Lymphadenopathy is similar to prior study and correspond to the patient's apparent history of leukemia. Thyroid gland is unremarkable. Right central venous catheter with tip at the cavoatrial junction. Lungs/Pleura: Large bilateral pleural effusions with possible loculation on the right. There is mild increase of pleural effusion since prior study. Areas of consolidation or atelectasis in the lung bases with patchy airspace disease in both lungs. Airspace changes are increasing since the prior study suggesting likely superimposed multifocal pneumonia or aspiration. Diffuse peribronchial thickening on the right is increasing since prior study. Trachea and mainstem bronchi are mildly flatten due to extrinsic compression from lymphadenopathy but appear patent. No pneumothorax. Upper Abdomen: Severe enlarged spleen.  Celiac axis lymphadenopathy. Musculoskeletal: Degenerative changes in the spine. No focal bone lesions. Review of  the MIP images confirms the above findings. IMPRESSION: 1. No evidence of significant pulmonary embolus. 2. Mediastinal, hilar, axillary, and celiac axis lymphadenopathy is similar to prior study and likely related to history of leukemia. 3. Large bilateral pleural effusions with progression since prior study. Compressive atelectasis or consolidation in the lung bases. Developing patchy airspace disease in both lungs likely representing superimposed  pneumonia or aspiration. 4. Markedly enlarged spleen likely related to leukemia. 5. Aortic atherosclerosis. Electronically Signed   By: Elsie Gravely M.D.   On: 01/13/2024 19:57   DG Chest 2 View Result Date: 01/13/2024 CLINICAL DATA:  Chest pain and shortness of breath. EXAM: CHEST - 2 VIEW COMPARISON:  January 06, 2024. FINDINGS: Stable cardiomediastinal silhouette. Right internal jugular Port-A-Cath is unchanged. Stable large loculated right pleural effusion is noted with associated right basilar atelectasis. Minimal left pleural effusion is noted. Bony thorax is unremarkable. IMPRESSION: Stable large loculated right pleural effusion is noted with associated right basilar atelectasis. Minimal left pleural effusion is noted. Electronically Signed   By: Lynwood Landy Raddle M.D.   On: 01/13/2024 18:24   DG CHEST PORT 1 VIEW Result Date: 01/06/2024 CLINICAL DATA:  Right pleural effusion. EXAM: PORTABLE CHEST 1 VIEW COMPARISON:  01/05/2024 FINDINGS: Similar appearance of large right pleural effusion with posterior loculated component better characterized on yesterday's CT. Small left effusion seen on yesterday's CT study not readily evident. The cardio pericardial silhouette is enlarged. Right Port-A-Cath again noted. Bones are diffusely demineralized. IMPRESSION: No substantial change. Similar appearance of large right pleural effusion with posterior loculated component better characterized on yesterday's CT. Electronically Signed   By: Camellia Candle M.D.   On: 01/06/2024 05:25   DG Chest Port 1 View Result Date: 01/05/2024 CLINICAL DATA:  Status post thoracentesis. EXAM: PORTABLE CHEST 1 VIEW COMPARISON:  Chest radiograph dated 01/05/2024. FINDINGS: Right-sided Port-A-Cath in similar position. No significant interval change in the size of the right pleural effusion and associated atelectasis/infiltrate. Underlying mass is not excluded. Small left pleural effusion as seen previously. No identifiable  pneumothorax. Stable cardiac silhouette no acute osseous pathology. IMPRESSION: No significant interval change since the prior radiograph. Electronically Signed   By: Vanetta Chou M.D.   On: 01/05/2024 15:49   CT CHEST W CONTRAST Result Date: 01/05/2024 CLINICAL DATA:  Pleural effusion, malignancy suspected Chest pain and shortness of breath since yesterday. EXAM: CT CHEST WITH CONTRAST TECHNIQUE: Multidetector CT imaging of the chest was performed during intravenous contrast administration. RADIATION DOSE REDUCTION: This exam was performed according to the departmental dose-optimization program which includes automated exposure control, adjustment of the mA and/or kV according to patient size and/or use of iterative reconstruction technique. CONTRAST:  75mL OMNIPAQUE  IOHEXOL  300 MG/ML  SOLN COMPARISON:  Chest CTA 01/02/2024, 09/24/2023 and 07/04/2023. FINDINGS: Cardiovascular: Accessed right IJ Port-A-Cath extends into the upper right atrium. No acute vascular findings are demonstrated. Mild atherosclerosis of the aorta, great vessels and coronary arteries. Calcifications of the aortic valve. The heart size is normal. There is no pericardial effusion. Mediastinum/Nodes: Multiple enlarged mediastinal and hilar lymph nodes are again noted, similar to recent prior studies. Representative lymph nodes include a 2.0 cm right hilar node on image 28/3 and a 1.9 cm subcarinal node on image 28/3. No progressive adenopathy identified in the short interval from the previous chest CT. There are small axillary lymph nodes bilaterally. The thyroid gland, trachea and esophagus demonstrate no significant findings. Lungs/Pleura: Organizing and enlarging right pleural effusion is associated with thin pleural enhancement, suspicious  for empyema. There is a small dependent left pleural effusion without suspicious pleural enhancement. Increasing compressive atelectasis dependently in the right upper, middle and lower lobes.  Unchanged patchy airspace disease posteromedially in the left lower lobe. Upper abdomen: No acute findings are seen within the visualized upper abdomen. The spleen is moderately enlarged. There are mildly enlarged lymph nodes within the visualized upper abdomen, grossly stable. Musculoskeletal/Chest wall: There is no chest wall mass or suspicious osseous finding. Mild multilevel spondylosis. IMPRESSION: 1. Organizing and enlarging right pleural effusion with thin pleural enhancement, suspicious for empyema. Correlate clinically. Recommend thoracentesis. 2. Increasing compressive atelectasis dependently in the right upper, middle and lower lobes. 3. Stable small left pleural effusion with patchy airspace disease posteromedially in the left lower lobe. 4. 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 Given the pleural process, some of these lymph nodes could be reactive. Associated moderate splenomegaly. 5.  Aortic Atherosclerosis (ICD10-I70.0). Electronically Signed   By: Elsie Perone M.D.   On: 01/05/2024 10:38   DG Chest 2 View Result Date: 01/05/2024 CLINICAL DATA:  68 year old female with chest pain.  Leukemia. EXAM: CHEST - 2 VIEW COMPARISON:  CTA chest 01/02/2024 and earlier. FINDINGS: PA and lateral views 0425 hours. Right chest Port-A-Cath is stable. Combined layering and loculated pleural effusions right greater than left with veiling opacity in the lungs. That on the right has progressed since 01/01/2024. No superimposed pneumothorax or pulmonary edema. No air bronchograms. Stable cardiac size and mediastinal contours - with mediastinal and hilar lymphadenopathy better demonstrated by CT. Visualized tracheal air column is within normal limits. No acute osseous abnormality identified. Stable cholecystectomy clips. Nonobstructed visible bowel gas pattern. IMPRESSION: Progressed appearance of Right lung combined layering and  loculated pleural effusion since 01/01/2024 CT. Stable smaller left pleural effusion. Known mediastinal lymphadenopathy. Electronically Signed   By: VEAR Hurst M.D.   On: 01/05/2024 05:01   CT Angio Chest PE W and/or Wo Contrast Result Date: 01/02/2024 CLINICAL DATA:  Chest pain radiating to the left neck. Shortness of breath, dizziness, lightheadedness, nausea/vomiting, and weakness. History of leukemia. EXAM: CT ANGIOGRAPHY CHEST CT ABDOMEN AND PELVIS WITH CONTRAST TECHNIQUE: Multidetector CT imaging of the chest was performed using the standard protocol during bolus administration of intravenous contrast. Multiplanar CT image reconstructions and MIPs were obtained to evaluate the vascular anatomy. Multidetector CT imaging of the abdomen and pelvis was performed using the standard protocol during bolus administration of intravenous contrast. RADIATION DOSE REDUCTION: This exam was performed according to the departmental dose-optimization program which includes automated exposure control, adjustment of the mA and/or kV according to patient size and/or use of iterative reconstruction technique. CONTRAST:  OMNIPAQUE  IOHEXOL  350 MG/ML SOLN COMPARISON:  CTA chest dated 09/24/2023 and CT abdomen/pelvis dated 05/17/2023 FINDINGS: CTA CHEST FINDINGS Cardiovascular: Satisfactory opacification of the pulmonary arteries to the segmental level. No evidence of pulmonary embolism. Normal heart size. No pericardial effusion. Mild thoracic aortic atherosclerosis. Mediastinum/Nodes: Mediastinal and bilateral perihilar lymphadenopathy progressive, including: --dominant 2.2 cm subcarinal node (image 87), progressive --19 mm short axis right Perihilar Node --11 mm short axis left axillary node, progressive Right chest port terminates at the cavoatrial junction. Lungs/Pleura: Moderate right pleural effusion, grossly unchanged, with associated compressive atelectasis in the right lower lobe. Small left pleural effusion, new, with  associated dependent atelectasis. No pneumothorax. Musculoskeletal: Mild degenerative changes of the thoracic spine. Review of the MIP images confirms the above findings. CT ABDOMEN and PELVIS FINDINGS Hepatobiliary:  No focal liver abnormality is seen. Status post cholecystectomy. Mild central intrahepatic and extrahepatic ductal prominence, postsurgical. Pancreas: Unremarkable. No pancreatic ductal dilatation or surrounding inflammatory changes. Spleen: Moderate splenomegaly, measuring 17.0 cm in maximal craniocaudal dimension, progressive. Adrenals/Urinary Tract: Adrenal glands are unremarkable. Kidneys are normal, without renal calculi, focal lesion, or hydronephrosis. Bladder is unremarkable. Stomach/Bowel: Stomach is within normal limits. Appendix appears normal (image 61). No evidence of bowel wall thickening, distention, or inflammatory changes. Vascular/Lymphatic: Atherosclerotic calcifications of the abdominal aorta and branch vessels, although patent. Progressive abdominopelvic lymphadenopathy, including: --15 mm short axis portacaval node (image 30) --13 mm short axis jejunal mesenteric node (image 45) --9 mm short axis left perirectal node (image 70) Reproductive: Calcified uterine fibroids.  No adnexal masses. Other: No abdominopelvic ascites Musculoskeletal: Mild degenerative changes of the lumbar spine. Review of the MIP images confirms the above findings. IMPRESSION: No evidence of pulmonary embolism. Progressive thoracic and abdominopelvic lymphadenopathy, as above, compatible with the patient's known leukemia. Moderate splenomegaly, progressive. Moderate right pleural effusion, grossly unchanged. Small left pleural effusion, new. Aortic Atherosclerosis (ICD10-I70.0). Electronically Signed   By: Pinkie Pebbles M.D.   On: 01/02/2024 01:45   CT ABDOMEN PELVIS W CONTRAST Result Date: 01/02/2024 CLINICAL DATA:  Chest pain radiating to the left neck. Shortness of breath, dizziness, lightheadedness,  nausea/vomiting, and weakness. History of leukemia. EXAM: CT ANGIOGRAPHY CHEST CT ABDOMEN AND PELVIS WITH CONTRAST TECHNIQUE: Multidetector CT imaging of the chest was performed using the standard protocol during bolus administration of intravenous contrast. Multiplanar CT image reconstructions and MIPs were obtained to evaluate the vascular anatomy. Multidetector CT imaging of the abdomen and pelvis was performed using the standard protocol during bolus administration of intravenous contrast. RADIATION DOSE REDUCTION: This exam was performed according to the departmental dose-optimization program which includes automated exposure control, adjustment of the mA and/or kV according to patient size and/or use of iterative reconstruction technique. CONTRAST:  OMNIPAQUE  IOHEXOL  350 MG/ML SOLN COMPARISON:  CTA chest dated 09/24/2023 and CT abdomen/pelvis dated 05/17/2023 FINDINGS: CTA CHEST FINDINGS Cardiovascular: Satisfactory opacification of the pulmonary arteries to the segmental level. No evidence of pulmonary embolism. Normal heart size. No pericardial effusion. Mild thoracic aortic atherosclerosis. Mediastinum/Nodes: Mediastinal and bilateral perihilar lymphadenopathy progressive, including: --dominant 2.2 cm subcarinal node (image 87), progressive --19 mm short axis right Perihilar Node --11 mm short axis left axillary node, progressive Right chest port terminates at the cavoatrial junction. Lungs/Pleura: Moderate right pleural effusion, grossly unchanged, with associated compressive atelectasis in the right lower lobe. Small left pleural effusion, new, with associated dependent atelectasis. No pneumothorax. Musculoskeletal: Mild degenerative changes of the thoracic spine. Review of the MIP images confirms the above findings. CT ABDOMEN and PELVIS FINDINGS Hepatobiliary: No focal liver abnormality is seen. Status post cholecystectomy. Mild central intrahepatic and extrahepatic ductal prominence,  postsurgical. Pancreas: Unremarkable. No pancreatic ductal dilatation or surrounding inflammatory changes. Spleen: Moderate splenomegaly, measuring 17.0 cm in maximal craniocaudal dimension, progressive. Adrenals/Urinary Tract: Adrenal glands are unremarkable. Kidneys are normal, without renal calculi, focal lesion, or hydronephrosis. Bladder is unremarkable. Stomach/Bowel: Stomach is within normal limits. Appendix appears normal (image 61). No evidence of bowel wall thickening, distention, or inflammatory changes. Vascular/Lymphatic: Atherosclerotic calcifications of the abdominal aorta and branch vessels, although patent. Progressive abdominopelvic lymphadenopathy, including: --15 mm short axis portacaval node (image 30) --13 mm short axis jejunal mesenteric node (image 45) --9 mm short axis left perirectal node (image 70) Reproductive: Calcified uterine fibroids.  No adnexal masses. Other: No abdominopelvic ascites Musculoskeletal: Mild  degenerative changes of the lumbar spine. Review of the MIP images confirms the above findings. IMPRESSION: No evidence of pulmonary embolism. Progressive thoracic and abdominopelvic lymphadenopathy, as above, compatible with the patient's known leukemia. Moderate splenomegaly, progressive. Moderate right pleural effusion, grossly unchanged. Small left pleural effusion, new. Aortic Atherosclerosis (ICD10-I70.0). Electronically Signed   By: Pinkie Pebbles M.D.   On: 01/02/2024 01:45   DG Chest 2 View Result Date: 01/01/2024 CLINICAL DATA:  Chest pain. Central chest pain starting today with radiation to the left neck. Shortness of breath, dizziness, lightheadedness, nausea, vomiting, and weakness. EXAM: CHEST - 2 VIEW COMPARISON:  12/21/2023 FINDINGS: Power port type central venous catheter with tip over the cavoatrial junction region. No pneumothorax. Heart size and pulmonary vascularity are normal. Small bilateral pleural effusions with basilar atelectasis are similar to  prior study. Mediastinal contours appear intact. Surgical clips in the right upper quadrant. Degenerative changes in the spine. IMPRESSION: Bilateral pleural effusions with basilar atelectasis, similar to prior study. Electronically Signed   By: Elsie Gravely M.D.   On: 01/01/2024 19:48   DG Chest 2 View Result Date: 12/21/2023 EXAM: 2 VIEW(S) XRAY OF THE CHEST 12/21/2023 04:52:11 AM COMPARISON: 09/26/2023 CLINICAL HISTORY: Chest pain. Complain of chest pain since Friday, shortness of breath present, progressively worse, feels like heaviness, nausea present as well. FINDINGS: LINES, TUBES AND DEVICES: Right IJ port catheter in place to distal SVC. LUNGS AND PLEURA: Chronic coarsened interstitial markings identified bilaterally. Bibasilar atelectasis or opacities. Small bilateral pleural effusions. HEART AND MEDIASTINUM: Heart size at upper limits of normal. BONES AND SOFT TISSUES: No acute osseous abnormality. IMPRESSION: 1. Bibasilar atelectasis and small bilateral pleural effusions. Electronically signed by: Waddell Calk MD 12/21/2023 05:38 AM EDT RP Workstation: [id number]    Microbiology: Results for orders placed or performed during the hospital encounter of 01/13/24  Resp panel by RT-PCR (RSV, Flu A&B, Covid) Anterior Nasal Swab     Status: None   Collection Time: 01/13/24  6:52 PM   Specimen: Anterior Nasal Swab  Result Value Ref Range Status   SARS Coronavirus 2 by RT PCR NEGATIVE NEGATIVE Final    Comment: (NOTE) SARS-CoV-2 target nucleic acids are NOT DETECTED.  The SARS-CoV-2 RNA is generally detectable in upper respiratory specimens during the acute phase of infection. The lowest concentration of SARS-CoV-2 viral copies this assay can detect is 138 copies/mL. A negative result does not preclude SARS-Cov-2 infection and should not be used as the sole basis for treatment or other patient management decisions. A negative result may occur with  improper specimen collection/handling,  submission of specimen other than nasopharyngeal swab, presence of viral mutation(s) within the areas targeted by this assay, and inadequate number of viral copies(<138 copies/mL). A negative result must be combined with clinical observations, patient history, and epidemiological information. The expected result is Negative.  Fact Sheet for Patients:  BloggerCourse.com  Fact Sheet for Healthcare Providers:  SeriousBroker.it  This test is no t yet approved or cleared by the [place]  FDA and  has been authorized for detection and/or diagnosis of SARS-CoV-2 by FDA under an Emergency Use Authorization (EUA). This EUA will remain  in effect (meaning this test can be used) for the duration of the COVID-19 declaration under Section 564(b)(1) of the Act, 21 U.S.C.section 360bbb-3(b)(1), unless the authorization is terminated  or revoked sooner.       Influenza A by PCR NEGATIVE NEGATIVE Final   Influenza B by PCR NEGATIVE NEGATIVE Final    Comment: (NOTE)  The Xpert Xpress SARS-CoV-2/FLU/RSV plus assay is intended as an aid in the diagnosis of influenza from Nasopharyngeal swab specimens and should not be used as a sole basis for treatment. Nasal washings and aspirates are unacceptable for Xpert Xpress SARS-CoV-2/FLU/RSV testing.  Fact Sheet for Patients: BloggerCourse.com  Fact Sheet for Healthcare Providers: SeriousBroker.it  This test is not yet approved or cleared by the [place]  FDA and has been authorized for detection and/or diagnosis of SARS-CoV-2 by FDA under an Emergency Use Authorization (EUA). This EUA will remain in effect (meaning this test can be used) for the duration of the COVID-19 declaration under Section 564(b)(1) of the Act, 21 U.S.C. section 360bbb-3(b)(1), unless the authorization is terminated or revoked.     Resp Syncytial Virus by PCR NEGATIVE  NEGATIVE Final    Comment: (NOTE) Fact Sheet for Patients: BloggerCourse.com  Fact Sheet for Healthcare Providers: SeriousBroker.it  This test is not yet approved or cleared by the [place]  FDA and has been authorized for detection and/or diagnosis of SARS-CoV-2 by FDA under an Emergency Use Authorization (EUA). This EUA will remain in effect (meaning this test can be used) for the duration of the COVID-19 declaration under Section 564(b)(1) of the Act, 21 U.S.C. section 360bbb-3(b)(1), unless the authorization is terminated or revoked.  Performed at [facility], [address] [place], [place] [address]   MRSA Next Gen by PCR, Nasal     Status: None   Collection Time: 01/13/24 10:57 PM   Specimen: Nasal Mucosa; Nasal Swab  Result Value Ref Range Status   MRSA by PCR Next Gen NOT DETECTED NOT DETECTED Final    Comment: (NOTE) The GeneXpert MRSA Assay (FDA approved for NASAL specimens only), is one component of a comprehensive MRSA colonization surveillance program. It is not intended to diagnose MRSA infection nor to guide or monitor treatment for MRSA infections. Test performance is not FDA approved in patients less than 44 years old. Performed at [facility], [address], [place], [place] [address]     Labs: CBC: Recent Labs  Lab 01/14/24 1044 01/15/24 0409 01/16/24 0209 01/17/24 0402 01/17/24 1352 01/18/24 0159  WBC 59.1* 60.3* 66.8* 58.5* 53.4* 59.2*  NEUTROABS 4.1  --   --   --  5.5  --   HGB 7.7* 8.0* 8.2* 7.1* 7.7* 7.8*  HCT 22.9* 24.4* 24.3* 22.0* 23.7* 24.0*  MCV 86.4 86.8 88.0 89.1 88.1 87.6  PLT 27* 16* 24* 14* 28* 23*   Basic Metabolic Panel: Recent Labs  Lab 01/14/24 1044 01/14/24 1627 01/15/24 0409 01/16/24 0209 01/17/24 0402 01/18/24 0159  NA 131*  --  132* 132* 129* 131*  K 3.5  --  4.2 4.3 4.2 4.2  CL 92*  --  93* 94* 94* 94*  CO2 31  --   33* 31 30 30   GLUCOSE 91  --  90 100* 84 99  BUN 11  --  15 15 13 11   CREATININE 0.53  --  9.45 0.61 0.55 0.53  CALCIUM 8.3*  --  8.5* 8.7* 8.2* 8.9  MG 1.9 1.8  --   --   --   --   PHOS 4.9*  --   --   --   --   --    Liver Function Tests: Recent Labs  Lab 01/14/24 1044  AST 33  ALT 31  ALKPHOS 174*  BILITOT 0.6  PROT 5.5*  ALBUMIN 3.0*   CBG: No results for input(s): GLUCAP  in the last 168 hours.  Discharge time spent: greater than 30 minutes.  Signed: Delon Herald, MD [facility] 01/18/2024

## 2024-01-19 ENCOUNTER — Encounter: Payer: Self-pay | Admitting: Oncology

## 2024-01-19 ENCOUNTER — Ambulatory Visit: Admitting: Hematology

## 2024-01-19 ENCOUNTER — Inpatient Hospital Stay

## 2024-01-19 ENCOUNTER — Inpatient Hospital Stay: Attending: Hematology

## 2024-01-19 ENCOUNTER — Other Ambulatory Visit: Payer: Self-pay | Admitting: *Deleted

## 2024-01-19 ENCOUNTER — Other Ambulatory Visit (HOSPITAL_COMMUNITY): Payer: Self-pay

## 2024-01-19 ENCOUNTER — Inpatient Hospital Stay (HOSPITAL_BASED_OUTPATIENT_CLINIC_OR_DEPARTMENT_OTHER): Admitting: Hematology

## 2024-01-19 DIAGNOSIS — D649 Anemia, unspecified: Secondary | ICD-10-CM

## 2024-01-19 DIAGNOSIS — R058 Other specified cough: Secondary | ICD-10-CM | POA: Insufficient documentation

## 2024-01-19 DIAGNOSIS — D630 Anemia in neoplastic disease: Secondary | ICD-10-CM | POA: Diagnosis not present

## 2024-01-19 DIAGNOSIS — C9200 Acute myeloblastic leukemia, not having achieved remission: Secondary | ICD-10-CM

## 2024-01-19 DIAGNOSIS — R531 Weakness: Secondary | ICD-10-CM | POA: Diagnosis not present

## 2024-01-19 DIAGNOSIS — D696 Thrombocytopenia, unspecified: Secondary | ICD-10-CM

## 2024-01-19 DIAGNOSIS — J90 Pleural effusion, not elsewhere classified: Secondary | ICD-10-CM | POA: Diagnosis not present

## 2024-01-19 DIAGNOSIS — R0902 Hypoxemia: Secondary | ICD-10-CM | POA: Diagnosis not present

## 2024-01-19 DIAGNOSIS — D6959 Other secondary thrombocytopenia: Secondary | ICD-10-CM | POA: Diagnosis not present

## 2024-01-19 LAB — PREPARE PLATELET PHERESIS
Unit division: 0
Unit division: 0

## 2024-01-19 LAB — CBC WITH DIFFERENTIAL (CANCER CENTER ONLY)
Abs Immature Granulocytes: 6.7 K/uL — ABNORMAL HIGH (ref 0.00–0.07)
Band Neutrophils: 1 %
Basophils Absolute: 0 K/uL (ref 0.0–0.1)
Basophils Relative: 0 %
Blasts: 65 %
Eosinophils Absolute: 0.7 K/uL — ABNORMAL HIGH (ref 0.0–0.5)
Eosinophils Relative: 1 %
HCT: 23 % — ABNORMAL LOW (ref 36.0–46.0)
Hemoglobin: 8 g/dL — ABNORMAL LOW (ref 12.0–15.0)
Lymphocytes Relative: 12 %
Lymphs Abs: 8 K/uL — ABNORMAL HIGH (ref 0.7–4.0)
MCH: 29.3 pg (ref 26.0–34.0)
MCHC: 34.8 g/dL (ref 30.0–36.0)
MCV: 84.2 fL (ref 80.0–100.0)
Metamyelocytes Relative: 5 %
Monocytes Absolute: 4 K/uL — ABNORMAL HIGH (ref 0.1–1.0)
Monocytes Relative: 6 %
Myelocytes: 5 %
Neutro Abs: 4 K/uL (ref 1.7–7.7)
Neutrophils Relative %: 5 %
Platelet Count: 14 K/uL — ABNORMAL LOW (ref 150–400)
RBC: 2.73 MIL/uL — ABNORMAL LOW (ref 3.87–5.11)
RDW: 15.7 % — ABNORMAL HIGH (ref 11.5–15.5)
Smear Review: NORMAL
WBC Count: 66.5 K/uL (ref 4.0–10.5)
nRBC: 0 % (ref 0.0–0.2)

## 2024-01-19 LAB — BPAM PLATELET PHERESIS
Blood Product Expiration Date: 202510142359
Blood Product Expiration Date: 202510142359
ISSUE DATE / TIME: 202510110843
Unit Type and Rh: 6200
Unit Type and Rh: 6200

## 2024-01-19 LAB — CBC
HCT: 24 % — ABNORMAL LOW (ref 36.0–46.0)
Hemoglobin: 7.8 g/dL — ABNORMAL LOW (ref 12.0–15.0)
MCH: 28.5 pg (ref 26.0–34.0)
MCHC: 32.5 g/dL (ref 30.0–36.0)
MCV: 87.6 fL (ref 80.0–100.0)
Platelets: 23 K/uL — CL (ref 150–400)
RBC: 2.74 MIL/uL — ABNORMAL LOW (ref 3.87–5.11)
RDW: 15.9 % — ABNORMAL HIGH (ref 11.5–15.5)
WBC: 59.2 K/uL (ref 4.0–10.5)
nRBC: 0 % (ref 0.0–0.2)

## 2024-01-19 LAB — SAMPLE TO BLOOD BANK

## 2024-01-19 LAB — PREPARE RBC (CROSSMATCH)

## 2024-01-19 MED ORDER — CETIRIZINE HCL 10 MG PO TABS: 10.0000 mg | ORAL_TABLET | Freq: Once | ORAL | Status: DC

## 2024-01-19 MED ORDER — SODIUM CHLORIDE 0.9% FLUSH: 10.0000 mL | INTRAVENOUS | Status: DC | PRN

## 2024-01-19 MED ORDER — SODIUM CHLORIDE 0.9% IV SOLUTION
250.0000 mL | INTRAVENOUS | Status: DC
  Administered 2024-01-19: 100 mL via INTRAVENOUS

## 2024-01-19 MED ORDER — CETIRIZINE HCL 10 MG PO TABS
10.0000 mg | ORAL_TABLET | Freq: Once | ORAL | Status: AC
  Administered 2024-01-19: 10 mg via ORAL

## 2024-01-19 MED ORDER — ACETAMINOPHEN 325 MG PO TABS
650.0000 mg | ORAL_TABLET | Freq: Once | ORAL | Status: AC
  Administered 2024-01-19: 650 mg via ORAL
  Filled 2024-01-19: qty 2

## 2024-01-19 NOTE — Patient Instructions (Signed)
 Blood Transfusion, Adult A blood transfusion is a procedure in which you receive blood through an IV tube. You may need this procedure because of: A bleeding disorder. An illness. An injury. A surgery. The blood may come from someone else (a donor). You may also be able to donate blood for yourself before a surgery. The blood given in a transfusion may be made up of different types of cells. You may get: Red blood cells. These carry oxygen to the cells in the body. Platelets. These help your blood to clot. Plasma. This is the liquid part of your blood. It carries proteins and other substances through the body. White blood cells. These help you fight infections. If you have a clotting disorder, you may also get other types of blood products. Depending on the type of blood product, this procedure may take 1-4 hours to complete. Tell your doctor about: Any bleeding problems you have. Any reactions you have had during a blood transfusion in the past. Any allergies you have. All medicines you are taking, including vitamins, herbs, eye drops, creams, and over-the-counter medicines. Any surgeries you have had. Any medical conditions you have. Whether you are pregnant or may be pregnant. What are the risks? Talk with your health care provider about risks. The most common problems include: A mild allergic reaction. This includes red, swollen areas of skin (hives) and itching. Fever or chills. This may be the body's response to new blood cells received. This may happen during or up to 4 hours after the transfusion. More serious problems may include: A serious allergic reaction. This includes breathing trouble or swelling around the face and lips. Too much fluid in the lungs. This may cause breathing problems. Lung injury. This causes breathing trouble and low oxygen in the blood. This can happen within hours of the transfusion or days later. Too much iron . This can happen after getting many blood  transfusions over a period of time. An infection or virus passed through the blood. This is rare. Donated blood is carefully tested before it is given. Your body's defense system (immune system) trying to attack the new blood cells. This is rare. Symptoms may include fever, chills, nausea, low blood pressure, and low back or chest pain. Donated cells attacking healthy tissues. This is rare. What happens before the procedure? You will have a blood test to find out your blood type. The test also finds out what type of blood your body will accept and matches it to the donor type. If you are going to have a planned surgery, you may be able to donate your own blood. This may be done in case you need a transfusion. You will have your temperature, blood pressure, and pulse checked. You may receive medicine to help prevent an allergic reaction. This may be done if you have had a reaction to a transfusion before. This medicine may be given to you by mouth or through an IV tube. What happens during the procedure?  An IV tube will be put into one of your veins. The bag of blood will be attached to your IV tube. Then, the blood will enter through your vein. Your temperature, blood pressure, and pulse will be checked often. This is done to find early signs of a transfusion reaction. Tell your nurse right away if you have any of these symptoms: Shortness of breath or trouble breathing. Chest or back pain. Fever or chills. Red, swollen areas of skin or itching. If you have any signs  or symptoms of a reaction, your transfusion will be stopped. You may also be given medicine. When the transfusion is finished, your IV tube will be taken out. Pressure may be put on the IV site for a few minutes. A bandage (dressing) will be put on the IV site. The procedure may vary among doctors and hospitals. What happens after the procedure? You will be monitored until you leave the hospital or clinic. This includes  checking your temperature, blood pressure, pulse, breathing rate, and blood oxygen level. Your blood may be tested to see how you have responded to the transfusion. You may be warmed with fluids or blankets. This is done to keep the temperature of your body normal. If you have your procedure in an outpatient setting, you will be told whom to contact to report any reactions. Where to find more information Visit the [employer]: redcross.org Summary A blood transfusion is a procedure in which you receive blood through an IV tube. The blood you are given may be made up of different blood cells. You may receive red blood cells, platelets, plasma, or white blood cells. Your temperature, blood pressure, and pulse will be checked often. After the procedure, your blood may be tested to see how you have responded. This information is not intended to replace advice given to you by your health care provider. Make sure you discuss any questions you have with your health care provider. Document Revised: 06/22/2021 Document Reviewed: 06/22/2021 Elsevier Patient Education  2024 [employer].

## 2024-01-19 NOTE — Progress Notes (Signed)
 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  Due to her severe cytopenias and low PS, she is not a candidate for more treatment.  -she has AML associated anemia and thrombocytopenia. Recent hospitalization for blood transfusion. Current hemoglobin level is 8 g/dL, borderline for transfusion. Platelet counts are low, requiring regular monitoring and transfusions. - Administer one unit of blood today - Schedule platelet transfusion for Wednesday - Schedule follow-up appointment for Friday  Weakness Generalized weakness, likely secondary to anemia and recent hospitalization. No new acute issues identified.  Diarrhea Diarrhea. No fever reported.  Cough Intermittent cough with clear sputum. Previously used promethazine  liquid for relief, though it is typically used for nausea. No fever or significant respiratory symptoms reported. - Recommend over-the-counter biotussin for cough management  Bilateral pleural effusion and hypoxia -Continue oxygen 2 L/min continuously -She has been evaluated by pulmonary service in the hospital, not a candidate for pleural effusion due to her trapped lungs.  Plan - Lab reviewed, will give 1 unit of blood and 1 unit of platelet today - Continue CBC and blood transfusion as needed 2-3 times a week - Palliative care clinic referral - Patient was also referred to [facility], she notes she can transfer to hospice care when she is ready. -f/u in 2 weeks    SUMMARY OF ONCOLOGIC HISTORY: Oncology History  Acute myeloid leukemia not having achieved remission (HCC)  03/10/2023 Initial Diagnosis   Acute  myeloid leukemia not having achieved remission (HCC)   05/12/2023 -  Chemotherapy   Patient is on Treatment Plan : LEUKEMIA AML Decitabine  D1-10 q28d        Discussed the use of AI scribe software for clinical note transcription with the patient, who gave verbal consent to proceed.  History of Present Illness [name] is a 68 year old female with acute myeloid leukemia who presents for follow-up and blood transfusion.  She was discharged from the hospital yesterday after receiving a blood transfusion the day before. She feels weak at home and has started using supplemental oxygen, though she is unsure of the flow rate. Her platelet counts remain low.  She experiences poor appetite, diarrhea, and a productive cough with clear sputum. There is no fever. She uses leftover promethazine  liquid for her cough, which was initially prescribed for nausea.  She is receiving palliative care and is in contact with a hospice organization, though she is not currently under hospice care.     All other systems were reviewed with the patient and are negative.  MEDICAL HISTORY:  Past Medical History:  Diagnosis Date   AML (acute myeloid leukemia) (HCC)    Anemia    Aortic atherosclerosis    Chronic hyponatremia    GERD (gastroesophageal reflux disease)    Thrombocytopenia     SURGICAL HISTORY: Past Surgical History:  Procedure Laterality Date   CARDIAC SURGERY     CHOLECYSTECTOMY     HIP ARTHROSCOPY     IR IMAGING GUIDED PORT INSERTION  05/19/2023   surgery on left foot Left     I have reviewed the social history and family history with the patient and they are unchanged from previous  note.  ALLERGIES:  is allergic to penicillin g, prednisone, shellfish allergy, codeine, heparin , and oxycodone .  MEDICATIONS:  Current Outpatient Medications  Medication Sig Dispense Refill   acetaminophen  (TYLENOL ) 325 MG tablet Take 2 tablets (650 mg total) by mouth every 6 (six) hours as needed for  mild pain (pain score 1-3) or fever (or Fever >/= 101).     ALPRAZolam  (XANAX ) 0.5 MG tablet Take 0.5 mg by mouth 2 (two) times daily as needed for anxiety.     ascorbic acid  (VITAMIN C ) 500 MG tablet Take 500 mg by mouth daily.     b complex vitamins capsule Take 1 capsule by mouth daily.     cholecalciferol  (VITAMIN D3) 10 MCG (400 UNIT) TABS tablet Take 400 Units by mouth daily.     Dextromethorphan -guaiFENesin  (ROBAFEN DM) 20-200 MG/20ML LIQD Take 5 mLs by mouth every 4 (four) hours as needed. 118 mL 0   feeding supplement (ENSURE PLUS HIGH PROTEIN) LIQD Take 237 mLs by mouth 2 (two) times daily between meals. 14220 mL 0   furosemide (LASIX) 40 MG tablet Take 1 tablet (40 mg total) by mouth daily as needed for up to 18 days for fluid or edema. 30 tablet 0   hydrOXYzine  (ATARAX ) 25 MG tablet Take 1 tablet (25 mg total) by mouth 3 (three) times daily as needed for anxiety. 90 tablet 0   ipratropium-albuterol  (DUONEB) 0.5-2.5 (3) MG/3ML SOLN Take 3 mLs by nebulization every 6 (six) hours as needed (Shortness of breath, cough and/or wheeze). 360 mL 0   levofloxacin  (LEVAQUIN ) 750 MG tablet Take 1 tablet (750 mg total) by mouth daily for 15 days. 15 tablet 0   metroNIDAZOLE (FLAGYL) 500 MG tablet Take 1 tablet (500 mg total) by mouth every 12 (twelve) hours for 15 days. 30 tablet 0   nicotine  (NICODERM CQ  - DOSED IN MG/24 HOURS) 21 mg/24hr patch Place 1 patch (21 mg total) onto the skin daily. 28 patch 0   ondansetron  (ZOFRAN ) 8 MG tablet Take 8 mg by mouth every 8 (eight) hours as needed for nausea or vomiting.     pantoprazole  (PROTONIX ) 20 MG tablet TAKE ONE TABLET (20 MG TOTAL) BY MOUTH DAILY. (Patient taking differently: Take 20 mg by mouth daily before breakfast.) 30 tablet 0   prochlorperazine  (COMPAZINE ) 10 MG tablet Take 10 mg by mouth every 6 (six) hours as needed for nausea or vomiting.     promethazine  (PHENERGAN ) 25 MG tablet Take 25 mg by mouth every 6 (six) hours as needed for nausea.      traMADol  (ULTRAM ) 50 MG tablet Take 50 mg by mouth every 6 (six) hours as needed for moderate pain (pain score 4-6).     No current facility-administered medications for this visit.   Facility-Administered Medications Ordered in Other Visits  Medication Dose Route Frequency Provider Last Rate Last Admin   0.9 %  sodium chloride  infusion (Manually program via Guardrails IV Fluids)  250 mL Intravenous Continuous Lanny Callander, MD 10 mL/hr at 01/19/24 1407 100 mL at 01/19/24 1407   cetirizine (ZYRTEC) tablet 10 mg  10 mg Oral Once Lanny Callander, MD       loperamide  (IMODIUM ) capsule 4 mg  4 mg Oral Once Allen, Lauren G, NP       sodium chloride  flush (NS) 0.9 % injection 10 mL  10 mL Intracatheter PRN Lanny Callander, MD        PHYSICAL EXAMINATION: ECOG PERFORMANCE STATUS: 3 - Symptomatic, >50% confined to bed  There were no vitals filed for this visit. Wt Readings from Last 3 Encounters:  01/13/24 146 lb 6.2 oz (66.4 kg)  01/06/24 147 lb 11.3 oz (67 kg)  01/02/24 147 lb 4.3 oz (66.8 kg)     GENERAL:alert, chronically ill appearing SKIN: skin color, texture, turgor are normal, no rashes or significant lesions EYES: normal, Conjunctiva are pink and non-injected, sclera clear Musculoskeletal:no cyanosis of digits and no clubbing  NEURO: alert & oriented x 3 with fluent speech, no focal motor/sensory deficits  Physical Exam    LABORATORY DATA:  I have reviewed the data as listed    Latest Ref Rng & Units 01/19/2024   12:50 PM 01/18/2024    1:59 AM 01/17/2024    1:52 PM  CBC  WBC 4.0 - 10.5 K/uL 66.5  59.2  53.4   Hemoglobin 12.0 - 15.0 g/dL 8.0  7.8  7.7   Hematocrit 36.0 - 46.0 % 23.0  24.0  23.7   Platelets 150 - 400 K/uL 14  23  28          Latest Ref Rng & Units 01/18/2024    1:59 AM 01/17/2024    4:02 AM 01/16/2024    2:09 AM  CMP  Glucose 70 - 99 mg/dL 99  84  899   BUN 8 - 23 mg/dL 11  13  15    Creatinine 0.44 - 1.00 mg/dL 9.46  9.44  9.38   Sodium 135 - 145 mmol/L 131   129  132   Potassium 3.5 - 5.1 mmol/L 4.2  4.2  4.3   Chloride 98 - 111 mmol/L 94  94  94   CO2 22 - 32 mmol/L 30  30  31    Calcium 8.9 - 10.3 mg/dL 8.9  8.2  8.7       RADIOGRAPHIC STUDIES: I have personally reviewed the radiological images as listed and agreed with the findings in the report. No results found.    Orders Placed This Encounter  Procedures   Amb Referral to Palliative Care    Referral Priority:   Routine    Referral Type:   Consultation    Number of Visits Requested:   1   All questions were answered. The patient knows to call the clinic with any problems, questions or concerns. No barriers to learning was detected. The total time spent in the appointment was 25 minutes, including review of chart and various tests results, discussions about plan of care and coordination of care plan     Onita Mattock, MD 01/19/2024

## 2024-01-20 ENCOUNTER — Telehealth: Payer: Self-pay | Admitting: Hematology

## 2024-01-20 LAB — BPAM RBC
Blood Product Expiration Date: 202510292359
ISSUE DATE / TIME: 202510131540
Unit Type and Rh: 6200

## 2024-01-20 LAB — BPAM PLATELET PHERESIS
Blood Product Expiration Date: 202510152359
ISSUE DATE / TIME: 202510131433
Unit Type and Rh: 6200

## 2024-01-20 LAB — PREPARE PLATELET PHERESIS: Unit division: 0

## 2024-01-20 LAB — TYPE AND SCREEN
ABO/RH(D): A POS
Antibody Screen: NEGATIVE
Unit division: 0

## 2024-01-20 NOTE — Telephone Encounter (Signed)
 I informed [name] that her blood transfusions appointments are scheduled out until Oct. 31. I asked that she return my call if she needs to re-schedule.

## 2024-01-21 ENCOUNTER — Telehealth: Payer: Self-pay

## 2024-01-21 ENCOUNTER — Inpatient Hospital Stay

## 2024-01-21 ENCOUNTER — Other Ambulatory Visit: Payer: Self-pay

## 2024-01-21 DIAGNOSIS — D649 Anemia, unspecified: Secondary | ICD-10-CM

## 2024-01-21 DIAGNOSIS — C9200 Acute myeloblastic leukemia, not having achieved remission: Secondary | ICD-10-CM

## 2024-01-21 DIAGNOSIS — D696 Thrombocytopenia, unspecified: Secondary | ICD-10-CM

## 2024-01-21 LAB — CBC WITH DIFFERENTIAL (CANCER CENTER ONLY)
Abs Immature Granulocytes: 5.8 K/uL — ABNORMAL HIGH (ref 0.00–0.07)
Basophils Absolute: 0 K/uL (ref 0.0–0.1)
Basophils Relative: 0 %
Blasts: 67 %
Eosinophils Absolute: 0 K/uL (ref 0.0–0.5)
Eosinophils Relative: 0 %
HCT: 26.4 % — ABNORMAL LOW (ref 36.0–46.0)
Hemoglobin: 9.5 g/dL — ABNORMAL LOW (ref 12.0–15.0)
Lymphocytes Relative: 14 %
Lymphs Abs: 11.6 K/uL — ABNORMAL HIGH (ref 0.7–4.0)
MCH: 29.7 pg (ref 26.0–34.0)
MCHC: 36 g/dL (ref 30.0–36.0)
MCV: 82.5 fL (ref 80.0–100.0)
Metamyelocytes Relative: 1 %
Monocytes Absolute: 2.5 K/uL — ABNORMAL HIGH (ref 0.1–1.0)
Monocytes Relative: 3 %
Myelocytes: 6 %
Neutro Abs: 7.5 K/uL (ref 1.7–7.7)
Neutrophils Relative %: 9 %
Platelet Count: 19 K/uL — ABNORMAL LOW (ref 150–400)
RBC: 3.2 MIL/uL — ABNORMAL LOW (ref 3.87–5.11)
RDW: 15.6 % — ABNORMAL HIGH (ref 11.5–15.5)
WBC Count: 83.2 K/uL (ref 4.0–10.5)
nRBC: 0 % (ref 0.0–0.2)

## 2024-01-21 LAB — SAMPLE TO BLOOD BANK

## 2024-01-21 MED ORDER — SODIUM CHLORIDE 0.9% IV SOLUTION
250.0000 mL | INTRAVENOUS | Status: DC
  Administered 2024-01-21: 250 mL via INTRAVENOUS

## 2024-01-21 MED ORDER — CETIRIZINE HCL 10 MG PO TABS
10.0000 mg | ORAL_TABLET | Freq: Once | ORAL | Status: AC
  Administered 2024-01-21: 10 mg via ORAL
  Filled 2024-01-21: qty 1

## 2024-01-21 NOTE — Telephone Encounter (Signed)
 Critical lab value reported:  WBC 83.2 and Plts 19.  Pt getting 1 unit of PRBCs today in infusion.  Dr. Lanny and Team made aware of WBC.

## 2024-01-21 NOTE — Patient Instructions (Signed)

## 2024-01-21 NOTE — Telephone Encounter (Signed)
 Critical lab value reported: Blast 67%  Notified Dr. Lanny and Team.

## 2024-01-22 ENCOUNTER — Other Ambulatory Visit: Payer: Self-pay

## 2024-01-22 LAB — PREPARE PLATELET PHERESIS: Unit division: 0

## 2024-01-22 LAB — BPAM PLATELET PHERESIS
Blood Product Expiration Date: 202510172359
ISSUE DATE / TIME: 202510151619
Unit Type and Rh: 6200

## 2024-01-23 ENCOUNTER — Inpatient Hospital Stay

## 2024-01-23 ENCOUNTER — Other Ambulatory Visit: Payer: Self-pay | Admitting: Hematology and Oncology

## 2024-01-23 ENCOUNTER — Telehealth: Payer: Self-pay

## 2024-01-23 ENCOUNTER — Other Ambulatory Visit: Payer: Self-pay

## 2024-01-23 DIAGNOSIS — C9200 Acute myeloblastic leukemia, not having achieved remission: Secondary | ICD-10-CM | POA: Diagnosis not present

## 2024-01-23 DIAGNOSIS — D649 Anemia, unspecified: Secondary | ICD-10-CM

## 2024-01-23 DIAGNOSIS — D696 Thrombocytopenia, unspecified: Secondary | ICD-10-CM

## 2024-01-23 LAB — CBC WITH DIFFERENTIAL (CANCER CENTER ONLY)
Abs Immature Granulocytes: 5.2 K/uL — ABNORMAL HIGH (ref 0.00–0.07)
Basophils Absolute: 0 K/uL (ref 0.0–0.1)
Basophils Relative: 0 %
Blasts: 66 %
Eosinophils Absolute: 0 K/uL (ref 0.0–0.5)
Eosinophils Relative: 0 %
HCT: 25.6 % — ABNORMAL LOW (ref 36.0–46.0)
Hemoglobin: 9 g/dL — ABNORMAL LOW (ref 12.0–15.0)
Lymphocytes Relative: 12 %
Lymphs Abs: 7.8 K/uL — ABNORMAL HIGH (ref 0.7–4.0)
MCH: 29.4 pg (ref 26.0–34.0)
MCHC: 35.2 g/dL (ref 30.0–36.0)
MCV: 83.7 fL (ref 80.0–100.0)
Metamyelocytes Relative: 1 %
Monocytes Absolute: 4.5 K/uL — ABNORMAL HIGH (ref 0.1–1.0)
Monocytes Relative: 7 %
Myelocytes: 7 %
Neutro Abs: 4.5 K/uL (ref 1.7–7.7)
Neutrophils Relative %: 7 %
Platelet Count: 19 K/uL — ABNORMAL LOW (ref 150–400)
RBC: 3.06 MIL/uL — ABNORMAL LOW (ref 3.87–5.11)
RDW: 15.4 % (ref 11.5–15.5)
WBC Count: 64.7 K/uL (ref 4.0–10.5)
nRBC: 0 % (ref 0.0–0.2)

## 2024-01-23 MED ORDER — ONDANSETRON HCL 8 MG PO TABS: 8.0000 mg | ORAL_TABLET | Freq: Three times a day (TID) | ORAL | 1 refills | Status: DC | PRN

## 2024-01-23 NOTE — Telephone Encounter (Signed)
 Critical Lab Value reported:  WBC 64.7; Plt 19; Blast 66%  Dr. Lanny and Team made aware.

## 2024-01-23 NOTE — Progress Notes (Signed)
 Per Dr. Lonn - no need for blood transfusion today.  Patient notified and deaccessed. Assisted patient and visitor to scheduling to help update appointments.  Patient discharged in stable condition.

## 2024-01-26 ENCOUNTER — Inpatient Hospital Stay

## 2024-01-26 ENCOUNTER — Encounter: Payer: Self-pay | Admitting: Hematology

## 2024-01-26 ENCOUNTER — Other Ambulatory Visit: Payer: Self-pay | Admitting: Hematology

## 2024-01-27 ENCOUNTER — Telehealth: Payer: Self-pay

## 2024-01-27 ENCOUNTER — Other Ambulatory Visit: Payer: Self-pay

## 2024-01-27 NOTE — Telephone Encounter (Signed)
 Pt's granddaughter called requesting if pt could come in today to get the blood transfusion which is currently scheduled on 01/28/2024.  Pt's granddaughter stated that the pt didn't need a transfusion last Friday based on her labs but feel the pt may need a blood transfusion now d/t pt c/o of fatigue.  Stated this nurse will check with the Infusion Charge nurse to see if they have availability.  Informed pt that unfortunately, Infusion does not have availability at this time to do the blood transfusion today.  Pt's granddaughter stated she will keep the appt for tomorrow 01/28/2024.

## 2024-01-28 ENCOUNTER — Telehealth: Payer: Self-pay

## 2024-01-28 ENCOUNTER — Encounter: Payer: Self-pay | Admitting: Hematology

## 2024-01-28 ENCOUNTER — Inpatient Hospital Stay

## 2024-01-28 ENCOUNTER — Other Ambulatory Visit: Payer: Self-pay

## 2024-01-28 DIAGNOSIS — D649 Anemia, unspecified: Secondary | ICD-10-CM

## 2024-01-28 DIAGNOSIS — D696 Thrombocytopenia, unspecified: Secondary | ICD-10-CM

## 2024-01-28 DIAGNOSIS — C9200 Acute myeloblastic leukemia, not having achieved remission: Secondary | ICD-10-CM | POA: Diagnosis not present

## 2024-01-28 LAB — CBC WITH DIFFERENTIAL (CANCER CENTER ONLY)
Abs Immature Granulocytes: 7.3 K/uL — ABNORMAL HIGH (ref 0.00–0.07)
Basophils Absolute: 0 K/uL (ref 0.0–0.1)
Basophils Relative: 0 %
Blasts: 63 %
Eosinophils Absolute: 0 K/uL (ref 0.0–0.5)
Eosinophils Relative: 0 %
HCT: 21.9 % — ABNORMAL LOW (ref 36.0–46.0)
Hemoglobin: 7.6 g/dL — ABNORMAL LOW (ref 12.0–15.0)
Lymphocytes Relative: 18 %
Lymphs Abs: 13.1 K/uL — ABNORMAL HIGH (ref 0.7–4.0)
MCH: 29 pg (ref 26.0–34.0)
MCHC: 34.7 g/dL (ref 30.0–36.0)
MCV: 83.6 fL (ref 80.0–100.0)
Metamyelocytes Relative: 4 %
Monocytes Absolute: 3.6 K/uL — ABNORMAL HIGH (ref 0.1–1.0)
Monocytes Relative: 5 %
Myelocytes: 6 %
Neutro Abs: 2.9 K/uL (ref 1.7–7.7)
Neutrophils Relative %: 4 %
Platelet Count: <5 K/uL — CL (ref 150–400)
RBC: 2.62 MIL/uL — ABNORMAL LOW (ref 3.87–5.11)
RDW: 15 % (ref 11.5–15.5)
WBC Count: 72.7 K/uL (ref 4.0–10.5)
nRBC: 0 % (ref 0.0–0.2)

## 2024-01-28 LAB — PREPARE RBC (CROSSMATCH)

## 2024-01-28 MED ORDER — SODIUM CHLORIDE 0.9% IV SOLUTION
250.0000 mL | INTRAVENOUS | Status: DC
  Administered 2024-01-28: 100 mL via INTRAVENOUS

## 2024-01-28 MED ORDER — DIPHENHYDRAMINE HCL 25 MG PO CAPS
25.0000 mg | ORAL_CAPSULE | Freq: Four times a day (QID) | ORAL | Status: DC | PRN
  Administered 2024-01-28: 25 mg via ORAL
  Filled 2024-01-28: qty 1

## 2024-01-28 NOTE — Telephone Encounter (Signed)
 Critical lab value reported:  WBC 72.7; Plt <5; Hbg 7.6.  Dr. Lanny notified.  Verbal order for 1 unit of PRBCs and 1 unit Plts to be administered today.  See Supportive Therapy plan for premeds.

## 2024-01-28 NOTE — Telephone Encounter (Signed)
 Critical lab value: Blast 63% today.  Notified Dr. Lanny.

## 2024-01-28 NOTE — Patient Instructions (Signed)

## 2024-01-29 LAB — PREPARE PLATELET PHERESIS: Unit division: 0

## 2024-01-29 LAB — BPAM PLATELET PHERESIS
Blood Product Expiration Date: 202510232359
ISSUE DATE / TIME: 202510221202
Unit Type and Rh: 5100

## 2024-01-30 ENCOUNTER — Other Ambulatory Visit: Payer: Self-pay | Admitting: Nurse Practitioner

## 2024-01-30 ENCOUNTER — Inpatient Hospital Stay

## 2024-01-30 ENCOUNTER — Telehealth: Payer: Self-pay

## 2024-01-30 ENCOUNTER — Other Ambulatory Visit: Payer: Self-pay

## 2024-01-30 VITALS — BP 122/50 | HR 101 | Temp 97.9°F | Resp 18

## 2024-01-30 DIAGNOSIS — C9200 Acute myeloblastic leukemia, not having achieved remission: Secondary | ICD-10-CM

## 2024-01-30 DIAGNOSIS — D649 Anemia, unspecified: Secondary | ICD-10-CM

## 2024-01-30 DIAGNOSIS — G893 Neoplasm related pain (acute) (chronic): Secondary | ICD-10-CM

## 2024-01-30 DIAGNOSIS — D696 Thrombocytopenia, unspecified: Secondary | ICD-10-CM

## 2024-01-30 LAB — CBC WITH DIFFERENTIAL (CANCER CENTER ONLY)
Abs Immature Granulocytes: 7.6 K/uL — ABNORMAL HIGH (ref 0.00–0.07)
Basophils Absolute: 0 K/uL (ref 0.0–0.1)
Basophils Relative: 0 %
Blasts: 65 %
Eosinophils Absolute: 1.7 K/uL — ABNORMAL HIGH (ref 0.0–0.5)
Eosinophils Relative: 2 %
HCT: 25.1 % — ABNORMAL LOW (ref 36.0–46.0)
Hemoglobin: 8.8 g/dL — ABNORMAL LOW (ref 12.0–15.0)
Lymphocytes Relative: 11 %
Lymphs Abs: 9.2 K/uL — ABNORMAL HIGH (ref 0.7–4.0)
MCH: 28.9 pg (ref 26.0–34.0)
MCHC: 35.1 g/dL (ref 30.0–36.0)
MCV: 82.6 fL (ref 80.0–100.0)
Metamyelocytes Relative: 3 %
Monocytes Absolute: 4.2 K/uL — ABNORMAL HIGH (ref 0.1–1.0)
Monocytes Relative: 5 %
Myelocytes: 6 %
Neutro Abs: 6.7 K/uL (ref 1.7–7.7)
Neutrophils Relative %: 8 %
Platelet Count: 8 K/uL — CL (ref 150–400)
RBC: 3.04 MIL/uL — ABNORMAL LOW (ref 3.87–5.11)
RDW: 15.3 % (ref 11.5–15.5)
WBC Count: 84 K/uL (ref 4.0–10.5)
nRBC: 0 % (ref 0.0–0.2)

## 2024-01-30 LAB — BPAM RBC
Blood Product Expiration Date: 202511062359
ISSUE DATE / TIME: 202510221255
Unit Type and Rh: 6200

## 2024-01-30 LAB — TYPE AND SCREEN
ABO/RH(D): A POS
Antibody Screen: NEGATIVE
Unit division: 0

## 2024-01-30 LAB — SAMPLE TO BLOOD BANK

## 2024-01-30 MED ORDER — SODIUM CHLORIDE 0.9% IV SOLUTION
250.0000 mL | INTRAVENOUS | Status: DC
  Administered 2024-01-30: 100 mL via INTRAVENOUS

## 2024-01-30 MED ORDER — DIPHENHYDRAMINE HCL 25 MG PO CAPS
25.0000 mg | ORAL_CAPSULE | Freq: Once | ORAL | Status: AC
  Administered 2024-01-30: 25 mg via ORAL
  Filled 2024-01-30: qty 1

## 2024-01-30 MED ORDER — PROCHLORPERAZINE MALEATE 10 MG PO TABS: 10.0000 mg | ORAL_TABLET | Freq: Four times a day (QID) | ORAL | 1 refills | Status: DC | PRN

## 2024-01-30 MED ORDER — TRAMADOL HCL 50 MG PO TABS: 50.0000 mg | ORAL_TABLET | Freq: Four times a day (QID) | ORAL | 0 refills | Status: DC | PRN

## 2024-01-30 MED ORDER — LIDOCAINE-PRILOCAINE 2.5-2.5 % EX CREA: 1.0000 | TOPICAL_CREAM | CUTANEOUS | 1 refills | Status: DC | PRN

## 2024-01-30 MED ORDER — PROMETHAZINE HCL 25 MG PO TABS: 25.0000 mg | ORAL_TABLET | Freq: Four times a day (QID) | ORAL | 0 refills | Status: DC | PRN

## 2024-01-30 MED ORDER — PROCHLORPERAZINE EDISYLATE 10 MG/2ML IJ SOLN
10.0000 mg | Freq: Once | INTRAMUSCULAR | Status: AC | PRN
  Administered 2024-01-30: 10 mg via INTRAVENOUS
  Filled 2024-01-30: qty 2

## 2024-01-30 MED ORDER — ACETAMINOPHEN 325 MG PO TABS
650.0000 mg | ORAL_TABLET | Freq: Once | ORAL | Status: AC
  Administered 2024-01-30: 650 mg via ORAL
  Filled 2024-01-30: qty 2

## 2024-01-30 MED ORDER — ALPRAZOLAM 0.5 MG PO TABS: 0.5000 mg | ORAL_TABLET | Freq: Two times a day (BID) | ORAL | 0 refills | Status: DC | PRN

## 2024-01-30 NOTE — Telephone Encounter (Signed)
 Critical lab value reported:  WBC 84.0 Plt 8 Hbg 8.8 Blast 65%     Notified Dr. Lanny and Team of pt's labs.

## 2024-01-30 NOTE — Progress Notes (Signed)
 Verbal order w/readback from Dr. Lanny for 1 unit Plts today.  Order placed and BB aware of orders.  Pt scheduled to come in on 02/02/2024 for lab & OV w/Dr. Lanny

## 2024-02-02 ENCOUNTER — Inpatient Hospital Stay

## 2024-02-02 ENCOUNTER — Telehealth: Payer: Self-pay

## 2024-02-02 ENCOUNTER — Other Ambulatory Visit: Payer: Self-pay

## 2024-02-02 ENCOUNTER — Inpatient Hospital Stay: Admitting: Nurse Practitioner

## 2024-02-02 VITALS — BP 125/57 | HR 103 | Temp 98.2°F | Resp 18

## 2024-02-02 DIAGNOSIS — C9200 Acute myeloblastic leukemia, not having achieved remission: Secondary | ICD-10-CM

## 2024-02-02 DIAGNOSIS — G893 Neoplasm related pain (acute) (chronic): Secondary | ICD-10-CM

## 2024-02-02 DIAGNOSIS — D696 Thrombocytopenia, unspecified: Secondary | ICD-10-CM

## 2024-02-02 DIAGNOSIS — D649 Anemia, unspecified: Secondary | ICD-10-CM

## 2024-02-02 LAB — CBC WITH DIFFERENTIAL (CANCER CENTER ONLY)
Abs Immature Granulocytes: 7.4 K/uL — ABNORMAL HIGH (ref 0.00–0.07)
Basophils Absolute: 0 K/uL (ref 0.0–0.1)
Basophils Relative: 0 %
Blasts: 68 %
Eosinophils Absolute: 2.8 K/uL — ABNORMAL HIGH (ref 0.0–0.5)
Eosinophils Relative: 3 %
HCT: 23 % — ABNORMAL LOW (ref 36.0–46.0)
Hemoglobin: 8 g/dL — ABNORMAL LOW (ref 12.0–15.0)
Lymphocytes Relative: 9 %
Lymphs Abs: 8.4 K/uL — ABNORMAL HIGH (ref 0.7–4.0)
MCH: 28.9 pg (ref 26.0–34.0)
MCHC: 34.8 g/dL (ref 30.0–36.0)
MCV: 83 fL (ref 80.0–100.0)
Metamyelocytes Relative: 3 %
Monocytes Absolute: 1.9 K/uL — ABNORMAL HIGH (ref 0.1–1.0)
Monocytes Relative: 2 %
Myelocytes: 4 %
Neutro Abs: 9.3 K/uL — ABNORMAL HIGH (ref 1.7–7.7)
Neutrophils Relative %: 10 %
Platelet Count: 9 K/uL — CL (ref 150–400)
Promyelocytes Relative: 1 %
RBC: 2.77 MIL/uL — ABNORMAL LOW (ref 3.87–5.11)
RDW: 15.2 % (ref 11.5–15.5)
WBC Count: 93 K/uL (ref 4.0–10.5)
nRBC: 0 % (ref 0.0–0.2)

## 2024-02-02 LAB — BPAM PLATELET PHERESIS
Blood Product Expiration Date: 202510272359
ISSUE DATE / TIME: 202510241340
Unit Type and Rh: 6200

## 2024-02-02 LAB — PREPARE PLATELET PHERESIS: Unit division: 0

## 2024-02-02 LAB — SAMPLE TO BLOOD BANK

## 2024-02-02 LAB — TYPE AND SCREEN
ABO/RH(D): A POS
Antibody Screen: NEGATIVE

## 2024-02-02 MED ORDER — PROCHLORPERAZINE EDISYLATE 10 MG/2ML IJ SOLN
10.0000 mg | Freq: Once | INTRAMUSCULAR | Status: AC
  Administered 2024-02-02: 10 mg via INTRAVENOUS
  Filled 2024-02-02: qty 2

## 2024-02-02 MED ORDER — SODIUM CHLORIDE 0.9% IV SOLUTION
250.0000 mL | INTRAVENOUS | Status: DC
  Administered 2024-02-02: 250 mL via INTRAVENOUS

## 2024-02-02 MED ORDER — MORPHINE SULFATE (PF) 2 MG/ML IV SOLN
2.0000 mg | Freq: Once | INTRAVENOUS | Status: AC
  Administered 2024-02-02: 2 mg via INTRAVENOUS
  Filled 2024-02-02: qty 1

## 2024-02-02 NOTE — Progress Notes (Signed)
 [facility]   Telephone:(336) [phone] Fax:(336) [phone]    Patient Care Team: Lanny Callander, MD as PCP - General (Hematology)   CHIEF COMPLAINT: Follow up AML  CURRENT THERAPY: Supportive care  INTERVAL HISTORY [name] presents for follow-up, here with her cousin.  She does not feel well, getting weaker and more tired by the day.  Does not notice improvements after transfusions.  She is having scattered bruising and occasional blood tinged sputum.  O2 requirement is the same.  Has pain in her shoulders and ribs.  1 tablet tramadol  every 6 hours does not help much.  Takes MiraLAX  to manage BMs.  She is not eating or drinking much, not out of bed at home.  Low-grade temps up to 100.1, denies shaking chills.  ROS  All other systems reviewed and negative   Past Medical History:  Diagnosis Date   AML (acute myeloid leukemia) (HCC)    Anemia    Aortic atherosclerosis    Chronic hyponatremia    GERD (gastroesophageal reflux disease)    Thrombocytopenia      Past Surgical History:  Procedure Laterality Date   CARDIAC SURGERY     CHOLECYSTECTOMY     HIP ARTHROSCOPY     IR IMAGING GUIDED PORT INSERTION  05/19/2023   surgery on left foot Left      Outpatient Encounter Medications as of 02/02/2024  Medication Sig   acetaminophen  (TYLENOL ) 325 MG tablet Take 2 tablets (650 mg total) by mouth every 6 (six) hours as needed for mild pain (pain score 1-3) or fever (or Fever >/= 101).   ALPRAZolam  (XANAX ) 0.5 MG tablet Take 1 tablet (0.5 mg total) by mouth 2 (two) times daily as needed for anxiety.   ascorbic acid  (VITAMIN C ) 500 MG tablet Take 500 mg by mouth daily.   b complex vitamins capsule Take 1 capsule by mouth daily.   cholecalciferol  (VITAMIN D3) 10 MCG (400 UNIT) TABS tablet Take 400 Units by mouth daily.   Dextromethorphan -guaiFENesin  (ROBAFEN DM) 20-200 MG/20ML LIQD Take 5 mLs by mouth every 4 (four) hours as needed.   feeding supplement (ENSURE PLUS HIGH  PROTEIN) LIQD Take 237 mLs by mouth 2 (two) times daily between meals.   furosemide (LASIX) 40 MG tablet Take 1 tablet (40 mg total) by mouth daily as needed for up to 18 days for fluid or edema.   hydrOXYzine  (ATARAX ) 25 MG tablet Take 1 tablet (25 mg total) by mouth 3 (three) times daily as needed for anxiety.   ipratropium-albuterol  (DUONEB) 0.5-2.5 (3) MG/3ML SOLN Take 3 mLs by nebulization every 6 (six) hours as needed (Shortness of breath, cough and/or wheeze).   lidocaine -prilocaine  (EMLA ) cream Apply 1 Application topically as needed. Apply to port-a-cath site 30 mins to 1 hr prior to port-a-cath access.   nicotine  (NICODERM CQ  - DOSED IN MG/24 HOURS) 21 mg/24hr patch Place 1 patch (21 mg total) onto the skin daily.   ondansetron  (ZOFRAN ) 8 MG tablet Take 1 tablet (8 mg total) by mouth every 8 (eight) hours as needed for nausea or vomiting.   pantoprazole  (PROTONIX ) 20 MG tablet TAKE ONE TABLET (20 MG TOTAL) BY MOUTH DAILY. (Patient taking differently: Take 20 mg by mouth daily before breakfast.)   prochlorperazine  (COMPAZINE ) 10 MG tablet Take 1 tablet (10 mg total) by mouth every 6 (six) hours as needed for nausea or vomiting.   promethazine  (PHENERGAN ) 25 MG tablet Take 1 tablet (25 mg total) by mouth  every 6 (six) hours as needed for nausea.   traMADol  (ULTRAM ) 50 MG tablet Take 1 tablet (50 mg total) by mouth every 6 (six) hours as needed for moderate pain (pain score 4-6).   No facility-administered encounter medications on file as of 02/02/2024.     Today's Vitals   02/02/24 1318  BP: 132/64  Pulse: (!) 108  Resp: 17  Temp: 98 F (36.7 C)  TempSrc: Temporal  SpO2: 98%  Weight: 133 lb (60.3 kg)   Body mass index is 22.13 kg/m.   ECOG PERFORMANCE STATUS: 4 - Bedbound  PHYSICAL EXAM GENERAL: Awake, frail, chronically ill-appearing in no acute distress  SKIN: No rash, scattered ecchymoses with large bruise over the right chest port EYES: sclera clear LUNGS: clear with  normal breathing effort HEART: regular rate & rhythm, mild bilateral lower extremity edema ABDOMEN: abdomen soft, non-tender and normal bowel sounds NEURO: alert & oriented x 3 with fluent speech, generalized weakness  PAC without erythema    CBC    Latest Ref Rng & Units 02/02/2024   12:30 PM 01/30/2024   12:17 PM 01/28/2024   11:05 AM  CBC  WBC 4.0 - 10.5 K/uL 93.0  84.0  72.7   Hemoglobin 12.0 - 15.0 g/dL 8.0  8.8  7.6   Hematocrit 36.0 - 46.0 % 23.0  25.1  21.9   Platelets 150 - 400 K/uL 9  8  <5       CMP     Latest Ref Rng & Units 01/18/2024    1:59 AM 01/17/2024    4:02 AM 01/16/2024    2:09 AM  CMP  Glucose 70 - 99 mg/dL 99  84  899   BUN 8 - 23 mg/dL 11  13  15    Creatinine 0.44 - 1.00 mg/dL 9.46  9.44  9.38   Sodium 135 - 145 mmol/L 131  129  132   Potassium 3.5 - 5.1 mmol/L 4.2  4.2  4.3   Chloride 98 - 111 mmol/L 94  94  94   CO2 22 - 32 mmol/L 30  30  31    Calcium 8.9 - 10.3 mg/dL 8.9  8.2  8.7       ASSESSMENT & PLAN:  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  Due to her severe cytopenias and low PS, she is not a candidate for more treatment.  -she has AML associated anemia and thrombocytopenia.    Weakness -Generalized weakness, likely secondary to anemia and recent hospitalization. No new acute issues identified.   Cough -Intermittent cough, blood tinged sputum 2/2 severe thrombocytopenia  -refill promethazine  DM for cough which helped before   Bilateral pleural effusion and hypoxia -Continue oxygen 2 L/min continuously -She has been evaluated by pulmonary service in the hospital, not a candidate for thoracentesis due to loculation/trapped lungs.   PLAN: -Morphine  x1 in clinic, refilled/ajusted xanax , tramadol , protonix , and cough med -1 unit plts today -Pt agreed to hospice, will notify [facility] to change palliative care phone call to in person hospice intake  -Cancel  future appts -F/up open -Pt seen with Dr. Lanny   No orders of the defined types were placed in this encounter.     All questions were answered. The patient knows to call the clinic with any problems, questions or concerns. No barriers to learning were detected.  [name] Awtrey K Raphel Stickles, NP 02/02/2024 2:22 PM   Addendum I have seen the patient, examined her.  I agree with the assessment and and plan and have edited the notes.   Patient is here for follow-up.  Her overall condition has declined rapidly, she has been more ecchymosis and fatigue.  She also complains of diffuse body pain.  Will give her IV morphine  and platelet transfusion today.  We again encouraged her to consider hospice and she agreed.  Will make a urgent referral.  She understands that we are going to stop checking her labs and blood transfusion.  Will continue to support her when she is on hospice care.  All questions were answered.

## 2024-02-02 NOTE — Progress Notes (Signed)
 CRITICAL VALUE STICKER  CRITICAL VALUE: WBC: 93.0, Plts 9  RECEIVER (on-site recipient of call): Landry ORN RN  DATE & TIME NOTIFIED: 02/02/24  12:49  MESSENGER (representative from lab):Aldona  MD NOTIFIED: Lacie Burton NP  TIME OF NOTIFICATION: 13:00  RESPONSE:  Platelets ordered/ Pt has infusion appt today

## 2024-02-02 NOTE — Progress Notes (Signed)
 Pt states she already took tylenol  and zyrtec prior to coming to infusion today.

## 2024-02-02 NOTE — Patient Instructions (Signed)
 Getting Platelets Through an IV (Platelet Transfusion) in Adults: What to Expect A platelet transfusion is when a person gets platelets from another person (donor) through an IV. Platelets are parts of blood that stick together and form a clot to help stop bleeding after an injury. If you don't have enough platelets, you might bleed or bruise easily. You may need a platelet transfusion if you have a health problem that causes a low number of platelets, such as thrombocytopenia. A platelet transfusion may be used to stop or prevent too much bleeding. Tell a health care provider about: Any reactions you've had during past transfusions. Any allergies you have. All medicines you take. These include vitamins, herbs, eye drops, and creams. Any bleeding problems you have. Any surgeries you've had. Any health problems you have. Whether you're pregnant or may be pregnant. What are the risks? Your health care provider will talk with you about risks. These may include: Fever or chills. This might happen if your body reacts to the new cells. It can happen during or up to 4 hours after the transfusion. A mild allergy. You might get red, swollen areas on your skin (hives). You might feel itchy. A bad allergy. You might have trouble breathing or swelling around your face and lips. Very bad reactions are rare but can happen. These may be: Infection. This is rare because donated blood is carefully tested. Hemolytic reaction. This is when the defense system (immune system) of your body attacks the new cells. Symptoms include fever, chills, and a feeling that you may throw up. You may also have low blood pressure and pain in your chest or lower back. Transfusion-associated circulatory overload (TACO). This happens if fluids move to your lungs. This may cause breathing problems. Transfusion-related acute lung injury (TRALI). TRALI can cause breathing problems and low oxygen in your blood. This can happen within  hours or days after the transfusion. Transfusion-associated graft-versus-host disease (TAGVHD). This happens when donated cells attack the body's healthy tissues. What happens before? Take your medicines only as told. You will have a blood test to find out your blood type. This helps match the donor blood with your blood. If you've had an allergy to a transfusion in the past, you may be given medicine to help prevent another allergy. Your temperature, blood pressure, pulse, and breathing will be checked. What happens during platelet transfusion?  An IV will be put into a vein in your hand or arm. For your safety, two health care team members will check your identity and the donor platelets. The bag of donor platelets will be connected to your IV. The platelets will flow into your blood. This usually takes 30-60 minutes. Your temperature, blood pressure, pulse, and breathing will be checked. This helps catch signs of a reaction early. You will be watched for symptoms of all types of reactions, like chills, hives, or itching. If you show signs of a reaction, the transfusion will be stopped. You may be given medicine to help treat the reaction. When your transfusion is done, your IV will be removed. Pressure may be put on the IV site for a few minutes to stop any bleeding. The IV site will be covered with a bandage. These steps may vary. Ask what you can expect. What happens after? You will be watched closely until you leave. This includes checking your blood pressure, temperature, pulse rate, and breathing rate again. This information is not intended to replace advice given to you by your health care  provider. Make sure you discuss any questions you have with your health care provider. Document Revised: 07/11/2023 Document Reviewed: 07/11/2023 Elsevier Patient Education  2025 [employer].

## 2024-02-02 NOTE — Telephone Encounter (Signed)
 Critical lab value reported  Blast 68%  Landry Silvan, RN and Lacie Burton, NP at phone side at time of call from lab.

## 2024-02-03 ENCOUNTER — Telehealth: Payer: Self-pay | Admitting: Nurse Practitioner

## 2024-02-03 ENCOUNTER — Encounter: Payer: Self-pay | Admitting: Nurse Practitioner

## 2024-02-03 LAB — BPAM PLATELET PHERESIS
Blood Product Expiration Date: 202510302359
ISSUE DATE / TIME: 202510271536
Unit Type and Rh: 5100

## 2024-02-03 LAB — PREPARE PLATELET PHERESIS: Unit division: 0

## 2024-02-03 NOTE — Telephone Encounter (Signed)
 I spoke with patient's caregiver regarding palliative appointment but she stated patient was waiting for hospice care to do a home visit before proceeding.

## 2024-02-04 ENCOUNTER — Inpatient Hospital Stay

## 2024-02-04 ENCOUNTER — Telehealth: Payer: Self-pay

## 2024-02-04 NOTE — Telephone Encounter (Signed)
 Star w/[facility] called stating they are going to see this pt around 5pm today for hospice admission.  Star wanted to know if Dr. Lanny could be the attending for hospice.  Verbal order given to Star of Yes.  Star stated they explained to the pt that she would no longer be eligible to have blood transfusion once admitted on hospice.  Star stated the pt verbalized understanding and stated they will cancel the pt's appt scheduled at [facility] for 02/06/2024.  Asked Star if she could contact Dr. Demetra office on 02/05/2024 once the pt is officially admitted to hospice just in case the pt changes her mind.  Star stated she will contact Dr. Demetra office once the pt is officially admitted.

## 2024-02-06 ENCOUNTER — Inpatient Hospital Stay

## 2024-02-06 ENCOUNTER — Encounter: Payer: Self-pay | Admitting: Oncology

## 2024-02-06 ENCOUNTER — Other Ambulatory Visit: Payer: Self-pay

## 2024-03-08 DEATH — deceased
